# Patient Record
Sex: Male | Born: 1960 | Race: Black or African American | Hispanic: Refuse to answer | Marital: Single | Smoking: Current some day smoker
Health system: Southern US, Community
[De-identification: ages and names within clinical notes are randomized; demographics above are authoritative.]

## PROBLEM LIST (undated history)

## (undated) DIAGNOSIS — E669 Obesity, unspecified: Secondary | ICD-10-CM

## (undated) DIAGNOSIS — G4730 Sleep apnea, unspecified: Secondary | ICD-10-CM

## (undated) DIAGNOSIS — Z720 Tobacco use: Secondary | ICD-10-CM

## (undated) DIAGNOSIS — I10 Essential (primary) hypertension: Secondary | ICD-10-CM

## (undated) DIAGNOSIS — M25519 Pain in unspecified shoulder: Secondary | ICD-10-CM

## (undated) DIAGNOSIS — M961 Postlaminectomy syndrome, not elsewhere classified: Secondary | ICD-10-CM

## (undated) DIAGNOSIS — E785 Hyperlipidemia, unspecified: Secondary | ICD-10-CM

## (undated) HISTORY — DX: Pain in unspecified shoulder: M25.519

## (undated) HISTORY — PX: OTHER SURGICAL HISTORY: SHX169

## (undated) HISTORY — PX: GANGLION CYST EXCISION: SHX1691

## (undated) HISTORY — DX: Postlaminectomy syndrome, not elsewhere classified: M96.1

## (undated) HISTORY — DX: Tobacco use: Z72.0

## (undated) HISTORY — PX: TONSILLECTOMY: SUR1361

## (undated) HISTORY — DX: Hyperlipidemia, unspecified: E78.5

---

## 1995-02-05 HISTORY — PX: SPINAL CORD STIMULATOR IMPLANT: SHX2422

## 2007-02-05 HISTORY — PX: BACK SURGERY: SHX140

## 2009-12-26 ENCOUNTER — Emergency Department (HOSPITAL_COMMUNITY): Admission: EM | Admit: 2009-12-26 | Discharge: 2009-12-26 | Payer: Self-pay | Admitting: Emergency Medicine

## 2010-03-16 ENCOUNTER — Emergency Department (HOSPITAL_COMMUNITY): Payer: Medicare Other

## 2010-03-16 ENCOUNTER — Emergency Department (HOSPITAL_COMMUNITY)
Admission: EM | Admit: 2010-03-16 | Discharge: 2010-03-16 | Disposition: A | Discharge status: Home / Self Care | Payer: Medicare Other | Attending: Emergency Medicine | Admitting: Emergency Medicine

## 2010-03-16 DIAGNOSIS — J45909 Unspecified asthma, uncomplicated: Secondary | ICD-10-CM | POA: Insufficient documentation

## 2010-03-16 DIAGNOSIS — I10 Essential (primary) hypertension: Secondary | ICD-10-CM | POA: Insufficient documentation

## 2010-03-16 DIAGNOSIS — M545 Low back pain, unspecified: Secondary | ICD-10-CM | POA: Insufficient documentation

## 2010-03-16 DIAGNOSIS — E119 Type 2 diabetes mellitus without complications: Secondary | ICD-10-CM | POA: Insufficient documentation

## 2010-03-16 DIAGNOSIS — E669 Obesity, unspecified: Secondary | ICD-10-CM | POA: Insufficient documentation

## 2011-02-15 ENCOUNTER — Emergency Department (HOSPITAL_COMMUNITY)
Admission: EM | Admit: 2011-02-15 | Discharge: 2011-02-15 | Disposition: A | Discharge status: Home / Self Care | Payer: Medicare Other | Attending: Emergency Medicine | Admitting: Emergency Medicine

## 2011-02-15 ENCOUNTER — Encounter (HOSPITAL_COMMUNITY): Payer: Self-pay | Admitting: *Deleted

## 2011-02-15 DIAGNOSIS — H9209 Otalgia, unspecified ear: Secondary | ICD-10-CM | POA: Insufficient documentation

## 2011-02-15 DIAGNOSIS — E119 Type 2 diabetes mellitus without complications: Secondary | ICD-10-CM | POA: Insufficient documentation

## 2011-02-15 DIAGNOSIS — F068 Other specified mental disorders due to known physiological condition: Secondary | ICD-10-CM | POA: Insufficient documentation

## 2011-02-15 DIAGNOSIS — J45909 Unspecified asthma, uncomplicated: Secondary | ICD-10-CM | POA: Insufficient documentation

## 2011-02-15 DIAGNOSIS — Z79899 Other long term (current) drug therapy: Secondary | ICD-10-CM | POA: Insufficient documentation

## 2011-02-15 DIAGNOSIS — I10 Essential (primary) hypertension: Secondary | ICD-10-CM | POA: Insufficient documentation

## 2011-02-15 HISTORY — DX: Essential (primary) hypertension: I10

## 2011-02-15 MED ORDER — OXYCODONE-ACETAMINOPHEN 7.5-325 MG PO TABS: 1.0000 | ORAL_TABLET | ORAL | Status: AC | PRN

## 2011-02-15 MED ORDER — PENICILLIN V POTASSIUM 500 MG PO TABS: 500.0000 mg | ORAL_TABLET | Freq: Four times a day (QID) | ORAL | Status: AC

## 2011-02-15 NOTE — ED Provider Notes (Signed)
History     CSN: [id number]  Arrival date & time 02/15/11  4098   First MD Initiated Contact with Patient 02/15/11 [phone]      Chief Complaint  Patient presents with  . Otalgia    (Consider location/radiation/quality/duration/timing/severity/associated sxs/prior treatment) HPI The pt is c/o Pain in his lt ear for 2 weeks intermittently. He has seen his dentist that said the pain wa not due to hsi teeth  Past Medical History  Diagnosis Date  . Hypertension   . Diabetes mellitus   . Asthma     History reviewed. No pertinent past surgical history.  History reviewed. No pertinent family history.  History  Substance Use Topics  . Smoking status: Current Everyday Smoker  . Smokeless tobacco: Not on file  . Alcohol Use: Yes      Review of Systems  Unable to perform ROS: Dementia  All other systems reviewed and are negative.    Allergies  Review of patient's allergies indicates no known allergies.  Home Medications   Current Outpatient Rx  Name Route Sig Dispense Refill  . LISINOPRIL PO Oral Take 1 tablet by mouth daily.    Marland Kitchen PRAVASTATIN SODIUM PO Oral Take 1 tablet by mouth daily.    Marland Kitchen SITAGLIPTIN-METFORMIN HCL 50-500 MG PO TABS Oral Take 1 tablet by mouth 2 (two) times daily with a meal.    . OXYCODONE-ACETAMINOPHEN 7.5-325 MG PO TABS Oral Take 1 tablet by mouth every 4 (four) hours as needed for pain. 30 tablet 0  . PENICILLIN V POTASSIUM 500 MG PO TABS Oral Take 1 tablet (500 mg total) by mouth 4 (four) times daily. 40 tablet 0    BP 125/80  Pulse 90  Temp(Src) 98.3 F (36.8 C) (Oral)  Resp 18  SpO2 95%  Physical Exam  Nursing note and vitals reviewed. Constitutional: He appears well-developed and well-nourished. He is sleeping. No distress.  HENT:  Head: Normocephalic and atraumatic.  Right Ear: Tympanic membrane and external ear normal.  Left Ear: Tympanic membrane and external ear normal.  Mouth/Throat:    Eyes: Pupils are equal, round, and  reactive to light.  Neck: Normal range of motion.  Cardiovascular: Normal rate and intact distal pulses.   Pulmonary/Chest: Effort normal and breath sounds normal. No respiratory distress. He has no rales.  Abdominal: Normal appearance. He exhibits no distension.  Musculoskeletal: Normal range of motion.  Neurological: No cranial nerve deficit.  Skin: Skin is warm and dry. No rash noted.  Psychiatric: He has a normal mood and affect. His behavior is normal.    ED Course  Procedures (including critical care time)  Labs Reviewed - No data to display No results found.   1. Otalgia       MDM          Nelia Shi, MD 02/23/11 1816

## 2011-02-15 NOTE — ED Notes (Signed)
The pt is c/o  Pain in his lt ear for 2 weeks intermittently.  He has seen his dentist that said the pain wa not due to hsi teeth

## 2011-05-06 ENCOUNTER — Encounter (HOSPITAL_COMMUNITY): Payer: Self-pay | Admitting: Emergency Medicine

## 2011-05-06 ENCOUNTER — Emergency Department (HOSPITAL_COMMUNITY): Payer: Medicare Other

## 2011-05-06 ENCOUNTER — Emergency Department (HOSPITAL_COMMUNITY)
Admission: EM | Admit: 2011-05-06 | Discharge: 2011-05-06 | Disposition: A | Discharge status: Home / Self Care | Payer: Medicare Other | Attending: Emergency Medicine | Admitting: Emergency Medicine

## 2011-05-06 DIAGNOSIS — Y92009 Unspecified place in unspecified non-institutional (private) residence as the place of occurrence of the external cause: Secondary | ICD-10-CM | POA: Insufficient documentation

## 2011-05-06 DIAGNOSIS — S93402A Sprain of unspecified ligament of left ankle, initial encounter: Secondary | ICD-10-CM

## 2011-05-06 DIAGNOSIS — I10 Essential (primary) hypertension: Secondary | ICD-10-CM | POA: Insufficient documentation

## 2011-05-06 DIAGNOSIS — W108XXA Fall (on) (from) other stairs and steps, initial encounter: Secondary | ICD-10-CM | POA: Insufficient documentation

## 2011-05-06 DIAGNOSIS — M25579 Pain in unspecified ankle and joints of unspecified foot: Secondary | ICD-10-CM | POA: Insufficient documentation

## 2011-05-06 DIAGNOSIS — S93409A Sprain of unspecified ligament of unspecified ankle, initial encounter: Secondary | ICD-10-CM | POA: Insufficient documentation

## 2011-05-06 DIAGNOSIS — Z79899 Other long term (current) drug therapy: Secondary | ICD-10-CM | POA: Insufficient documentation

## 2011-05-06 DIAGNOSIS — E119 Type 2 diabetes mellitus without complications: Secondary | ICD-10-CM | POA: Insufficient documentation

## 2011-05-06 DIAGNOSIS — M25473 Effusion, unspecified ankle: Secondary | ICD-10-CM | POA: Insufficient documentation

## 2011-05-06 DIAGNOSIS — M25476 Effusion, unspecified foot: Secondary | ICD-10-CM | POA: Insufficient documentation

## 2011-05-06 DIAGNOSIS — J45909 Unspecified asthma, uncomplicated: Secondary | ICD-10-CM | POA: Insufficient documentation

## 2011-05-06 DIAGNOSIS — F17200 Nicotine dependence, unspecified, uncomplicated: Secondary | ICD-10-CM | POA: Insufficient documentation

## 2011-05-06 HISTORY — DX: Obesity, unspecified: E66.9

## 2011-05-06 MED ORDER — IBUPROFEN 400 MG PO TABS: 400.0000 mg | ORAL_TABLET | Freq: Three times a day (TID) | ORAL | Status: AC | PRN

## 2011-05-06 MED ORDER — HYDROCODONE-ACETAMINOPHEN 5-325 MG PO TABS: 2.0000 | ORAL_TABLET | ORAL | Status: AC | PRN

## 2011-05-06 NOTE — Progress Notes (Signed)
Orthopedic Tech Progress Note Patient Details:  Christian Bowen November 16, 1960 [id number]  Other Ortho Devices Type of Ortho Device: Crutches;Postop boot Ortho Device Location: (L) LE Ortho Device Interventions: Application;Ordered   Jennye Moccasin 05/06/2011, 11:10 PM

## 2011-05-06 NOTE — ED Notes (Signed)
PT. REPORTS LEFT ANKLE PAIN INJURED LAST Thursday WHILE GOING DOWN STEPS / FELL , AMBULATORY USING CANE.

## 2011-05-06 NOTE — ED Notes (Signed)
Ortho at bedside.

## 2011-05-06 NOTE — Discharge Instructions (Signed)
  Please review the instructions. Evaluated in the emergency department for the persistent pain and swelling in your left ankle since your fall last Thursday. The x-ray of your left ankle shows no broken bones. We have applied an Ace wrap, a wooden shoe and fitted you for crutches to assist in weightbearing. We have also prescribed a three-day regimen of ibuprofen and a short course of medication for pain. Please take as directed. If your symptoms are not improving over the next 5-7 days you'll need to call and arrange follow up with Dr. Lajoyce Corners for further evaluation of your left ankle pain.  Ankle Sprain An ankle sprain happens when the bands of tissue that hold the ankle bones together (ligaments) stretch too much and tear.  HOME CARE   Put ice on the ankle for 15 to 20 minutes, 3 to 4 times a day.   Put ice in a plastic bag.   Place a towel between your skin and the bag.   You may stop icing when the puffiness (swelling) goes down.   Raise (elevate) the injured ankle to lessen puffiness.   Use crutches if your doctor tells you to. Use them until you can walk without pain.   If a plaster splint was applied:   Rest the plaster splint on nothing harder than a pillow for 24 hours.   Do not put weight on it.   Do not get it wet.   Take it off to shower or bathe.   Follow up with your doctor.   Use an elastic wrap for support. Take the wrap off if the toes lose feeling (numb), tingle, or turn cold or blue.   If an air splint was applied:   Add or release air to make it comfortable.   Take it off at night and to shower and bathe.   Wiggle your toes while wearing the air splint.   Only take medicine as told by your doctor.   Do not drive until your doctor says it is okay.  GET HELP RIGHT AWAY IF:   You have more bruising, puffiness, or pain.   Your toes feel cold.   Your medicine does not help lessen your pain.   You are losing feeling in your toes or they turn blue.    You have severe pain.  MAKE SURE YOU:   Understand these instructions.   Will watch your condition.   Will get help right away if you are not doing well or get worse.  Document Released: 07/10/2007 Document Revised: 01/10/2011 Document Reviewed: 07/10/2007 [facility] Patient Information 2012 [place], [place].

## 2011-05-06 NOTE — ED Notes (Signed)
Ortho made aware  

## 2011-05-06 NOTE — ED Provider Notes (Signed)
History     CSN: [id number]  Arrival date & time 05/06/11  1939   First MD Initiated Contact with Patient 05/06/11 2236      Chief Complaint  Patient presents with  . Ankle Pain    (Consider location/radiation/quality/duration/timing/severity/associated sxs/prior treatment) Patient is a 51 y.o. male presenting with ankle pain. The history is provided by the patient.  Ankle Pain  The incident occurred more than 2 days ago. The incident occurred at home. The injury mechanism was a fall. The pain is present in the left ankle. The pain is at a severity of 10/10. The pain is severe. The pain has been constant since onset. Pertinent negatives include no numbness, no inability to bear weight and no tingling. The symptoms are aggravated by activity. He has tried rest, NSAIDs, elevation and immobilization for the symptoms.  Patient reports that last Thursday he fell going down some steps and twisted his left ankle. States he has had persistent swelling and pain since that time but has not seemed to improve. Patient is concerned he may have broken something in his ankle or damage to the ligament.  Past Medical History  Diagnosis Date  . Hypertension   . Diabetes mellitus   . Asthma   . Obesity     History reviewed. No pertinent past surgical history.  No family history on file.  History  Substance Use Topics  . Smoking status: Current Everyday Smoker  . Smokeless tobacco: Not on file  . Alcohol Use: Yes      Review of Systems  Constitutional: Negative.   HENT: Negative.   Eyes: Negative.   Respiratory: Negative.   Cardiovascular: Negative.   Gastrointestinal: Negative.   Genitourinary: Negative.   Musculoskeletal: Negative.   Skin: Negative.   Neurological: Negative.  Negative for tingling and numbness.  Hematological: Negative.   Psychiatric/Behavioral: Negative.     Allergies  Review of patient's allergies indicates no known allergies.  Home Medications   Current  Outpatient Rx  Name Route Sig Dispense Refill  . IBUPROFEN 800 MG PO TABS Oral Take 800 mg by mouth once.    Marland Kitchen LISINOPRIL 10 MG PO TABS Oral Take 10 mg by mouth every morning.    Marland Kitchen PRAVASTATIN SODIUM 20 MG PO TABS Oral Take 20 mg by mouth 2 (two) times daily.    Marland Kitchen SITAGLIPTIN-METFORMIN HCL 50-500 MG PO TABS Oral Take 1 tablet by mouth 2 (two) times daily with a meal.      BP 162/92  Pulse 97  Temp(Src) 99.2 F (37.3 C) (Oral)  Resp 20  SpO2 96%  Physical Exam  Constitutional: He is oriented to person, place, and time. He appears well-developed and well-nourished.  HENT:  Head: Normocephalic and atraumatic.  Eyes: Conjunctivae are normal.  Neck: Neck supple.  Cardiovascular: Normal rate and regular rhythm.   Pulmonary/Chest: Effort normal.  Musculoskeletal: Normal range of motion.       Feet:       Moderate amount of swelling noted to both the medial and lateral aspects of the left ankle. No obvious deformity. TTP and pain with passive range of motion.  Neurological: He is alert and oriented to person, place, and time.  Skin: Skin is warm and dry.  Psychiatric: He has a normal mood and affect.    ED Course  Procedures   Left ankle x-ray without acute findings. Ace wrap and postop boot applied. Patient fitted for crutches. Findings and clinical impression discussed with patient. Will plan for  discharge home with short course of medication for pain, and encourage patient to use crutches to assist with weightbearing and provide ortho referral for the patient to arrange followup for further evaluation if symptoms do not improve over the next 5-7 days. Patient agreeable with plan.  Labs Reviewed - No data to display Dg Ankle Complete Left  05/06/2011  *RADIOLOGY REPORT*  Clinical Data: Twisted left ankle.  LEFT ANKLE COMPLETE - 3+ VIEW  Comparison: None  Findings: The ankle mortise is maintained.  Mild degenerative changes are noted.  Rounded well-corticated bony density adjacent to  the distal fibula could be an old avulsion injury or infused secondary ossification center.  No acute fracture.  The visualized mid and hind foot bony structures are intact.  IMPRESSION: No acute fracture. Degenerative changes.  Original Report Authenticated By: P. Loralie Champagne, M.D.     No diagnosis found.    MDM  HPI/PE and clinical findings c/w 1. Ankle sprain (l) ankle  (left ankle x-ray without acute findings, moderate amount of swelling to medial and lateral aspects of left ankle, place and postop boot applied and patient fitted for crutches to assist with weightbearing. He was encouraged to follow up with orthosis symptoms do not improve over the next 5-7 days)        Leanne Chang, NP 05/06/11 [phone]

## 2011-05-06 NOTE — ED Notes (Signed)
Pt complaining of left ankle pain. He stated that he tripped down the stairs 1.5 weeks ago. Pain has become increasingly worse. Swelling in left ankle. Pt is able to move ankle up and down, but does complain of tenderness to touch. Pedal pulse palpable in left foot.  Pt able to move toes. Capillary refill brisk. Slight deformity to outer ankle. Will continue to monitor.

## 2011-05-08 NOTE — ED Provider Notes (Signed)
Medical screening examination/treatment/procedure(s) were performed by non-physician practitioner and as supervising physician I was immediately available for consultation/collaboration.   Shelda Jakes, MD 05/08/11 [phone]

## 2011-09-05 HISTORY — PX: NASAL TURBINATE REDUCTION: SHX2072

## 2011-09-13 ENCOUNTER — Encounter (HOSPITAL_COMMUNITY): Payer: Self-pay | Admitting: Pharmacy Technician

## 2011-09-20 ENCOUNTER — Encounter (HOSPITAL_COMMUNITY): Payer: Self-pay | Admitting: *Deleted

## 2011-09-20 NOTE — H&P (Signed)
PREOPERATIVE H&P  Chief Complaint: OSA  HPI: Christian Bowen is a 51 y.o. male who presents for evaluation of OSA and large tonsils. [place] presently uses nasal CPAP but doesn't tolerate this well. He had a sleep test several years ago with RDI of 42. He does experience nasal obstruction left side worse than right. He is taken to the OR for UPPP, septoplasty and TR.  Past Medical History  Diagnosis Date  . Hypertension   . Diabetes mellitus   . Asthma   . Obesity    No past surgical history on file. History   Social History  . Marital Status: Divorced    Spouse Name: N/A    Number of Children: N/A  . Years of Education: N/A   Social History Main Topics  . Smoking status: Current Everyday Smoker  . Smokeless tobacco: Not on file  . Alcohol Use: Yes  . Drug Use: No  . Sexually Active:    Other Topics Concern  . Not on file   Social History Narrative  . No narrative on file   No family history on file. No Known Allergies Prior to Admission medications   Medication Sig Start Date End Date Taking? Authorizing Provider  acetaminophen-codeine (TYLENOL #3) 300-30 MG per tablet Take 1 tablet by mouth every 4 (four) hours as needed. For pain   Yes Historical Provider, MD  lisinopril (PRINIVIL,ZESTRIL) 10 MG tablet Take 10 mg by mouth every morning.   Yes Historical Provider, MD  pravastatin (PRAVACHOL) 20 MG tablet Take 20 mg by mouth daily.    Yes Historical Provider, MD  sitaGLIPtan-metformin (JANUMET) 50-500 MG per tablet Take 1 tablet by mouth 2 (two) times daily with a meal.   Yes Historical Provider, MD     Positive ROS: nasal obstruction  All other systems have been reviewed and were otherwise negative with the exception of those mentioned in the HPI and as above.  Physical Exam: There were no vitals filed for this visit.  General: Alert, no acute distress Oral: Normal oral mucosa and 3+ tonsils Nasal: Clear nasal passages. Septal deviation to L with large  turbinates. Neck: No palpable adenopathy or thyroid nodules Ear: Ear canal is clear with normal appearing TMs Cardiovascular: Regular rate and rhythm, no murmur.  Respiratory: Clear to auscultation Neurologic: Alert and oriented x 3   Assessment/Plan: obstructive sleep apnea nasal obstructive septal deviation and turbinate hypertophy Plan for Procedure(s): UVULOPALATOPHARYNGOPLASTY (UPPP)/TONSILLECTOMY/SEPTOPLASTY TURBINATE RESECTION   Dillard Cannon, MD 09/20/2011 4:40 PM  O

## 2011-09-23 ENCOUNTER — Encounter (HOSPITAL_COMMUNITY): Payer: Self-pay | Admitting: Certified Registered Nurse Anesthetist

## 2011-09-23 ENCOUNTER — Encounter (HOSPITAL_COMMUNITY): Payer: Self-pay | Admitting: *Deleted

## 2011-09-23 ENCOUNTER — Observation Stay (HOSPITAL_COMMUNITY)
Admission: RE | Admit: 2011-09-23 | Discharge: 2011-09-24 | Disposition: A | Discharge status: Home / Self Care | Payer: Medicare Other | Source: Ambulatory Visit | Attending: Otolaryngology | Admitting: Otolaryngology

## 2011-09-23 ENCOUNTER — Ambulatory Visit (HOSPITAL_COMMUNITY): Payer: Medicare Other | Admitting: Certified Registered Nurse Anesthetist

## 2011-09-23 ENCOUNTER — Encounter (HOSPITAL_COMMUNITY)
Admission: RE | Disposition: A | Discharge status: Home / Self Care | Payer: Self-pay | Source: Ambulatory Visit | Attending: Otolaryngology

## 2011-09-23 ENCOUNTER — Ambulatory Visit (HOSPITAL_COMMUNITY): Payer: Medicare Other

## 2011-09-23 DIAGNOSIS — G4733 Obstructive sleep apnea (adult) (pediatric): Principal | ICD-10-CM | POA: Insufficient documentation

## 2011-09-23 DIAGNOSIS — E669 Obesity, unspecified: Secondary | ICD-10-CM | POA: Insufficient documentation

## 2011-09-23 DIAGNOSIS — I10 Essential (primary) hypertension: Secondary | ICD-10-CM | POA: Insufficient documentation

## 2011-09-23 DIAGNOSIS — E119 Type 2 diabetes mellitus without complications: Secondary | ICD-10-CM | POA: Insufficient documentation

## 2011-09-23 DIAGNOSIS — J342 Deviated nasal septum: Secondary | ICD-10-CM | POA: Insufficient documentation

## 2011-09-23 DIAGNOSIS — J45909 Unspecified asthma, uncomplicated: Secondary | ICD-10-CM | POA: Insufficient documentation

## 2011-09-23 DIAGNOSIS — J351 Hypertrophy of tonsils: Secondary | ICD-10-CM | POA: Insufficient documentation

## 2011-09-23 HISTORY — DX: Sleep apnea, unspecified: G47.30

## 2011-09-23 LAB — SURGICAL PCR SCREEN: MRSA, PCR: NEGATIVE

## 2011-09-23 LAB — CBC
HCT: 41.1 % (ref 39.0–52.0)
Hemoglobin: 13.7 g/dL (ref 13.0–17.0)
MCH: 30.1 pg (ref 26.0–34.0)
MCHC: 33.3 g/dL (ref 30.0–36.0)
RDW: 12.8 % (ref 11.5–15.5)

## 2011-09-23 LAB — BASIC METABOLIC PANEL
BUN: 21 mg/dL (ref 6–23)
Calcium: 9.9 mg/dL (ref 8.4–10.5)
GFR calc Af Amer: >90 mL/min (ref >90)
GFR calc non Af Amer: >90 mL/min (ref >90)
Glucose, Bld: 315 mg/dL — ABNORMAL HIGH (ref 70–99)
Sodium: 136 mEq/L (ref 135–145)

## 2011-09-23 LAB — GLUCOSE, CAPILLARY
Glucose-Capillary: 357 mg/dL — ABNORMAL HIGH (ref 70–99)
Glucose-Capillary: 328 mg/dL — ABNORMAL HIGH (ref 70–99)

## 2011-09-23 SURGERY — SEPTOPLASTY, NOSE, WITH TONSILLECTOMY AND UPPP
Anesthesia: General | Site: Nose | Wound class: Clean Contaminated

## 2011-09-23 MED ORDER — MUPIROCIN 2 % EX OINT
TOPICAL_OINTMENT | Freq: Two times a day (BID) | CUTANEOUS | Status: DC
  Administered 2011-09-23: 21:00:00 via NASAL
  Administered 2011-09-23: 1 via NASAL
  Filled 2011-09-23 (×2): qty 22

## 2011-09-23 MED ORDER — ROCURONIUM BROMIDE 100 MG/10ML IV SOLN
INTRAVENOUS | Status: DC | PRN
  Administered 2011-09-23: 10 mg via INTRAVENOUS
  Administered 2011-09-23: 20 mg via INTRAVENOUS
  Administered 2011-09-23: 30 mg via INTRAVENOUS
  Administered 2011-09-23: 10 mg via INTRAVENOUS

## 2011-09-23 MED ORDER — BACITRACIN ZINC 500 UNIT/GM EX OINT
TOPICAL_OINTMENT | CUTANEOUS | Status: DC | PRN
  Administered 2011-09-23: 1 via TOPICAL

## 2011-09-23 MED ORDER — LIDOCAINE HCL 4 % MT SOLN
OROMUCOSAL | Status: DC | PRN
  Administered 2011-09-23: 4 mL via TOPICAL

## 2011-09-23 MED ORDER — HYDROMORPHONE HCL PF 1 MG/ML IJ SOLN
0.2500 mg | INTRAMUSCULAR | Status: DC | PRN
  Administered 2011-09-23 (×3): 0.5 mg via INTRAVENOUS

## 2011-09-23 MED ORDER — DEXAMETHASONE SODIUM PHOSPHATE 4 MG/ML IJ SOLN
INTRAMUSCULAR | Status: DC | PRN
  Administered 2011-09-23: 10 mg via INTRAVENOUS

## 2011-09-23 MED ORDER — INSULIN ASPART 100 UNIT/ML ~~LOC~~ SOLN: 0.0000 [IU] | Freq: Every day | SUBCUTANEOUS | Status: DC

## 2011-09-23 MED ORDER — INSULIN ASPART 100 UNIT/ML ~~LOC~~ SOLN
SUBCUTANEOUS | Status: AC
  Filled 2011-09-23: qty 6

## 2011-09-23 MED ORDER — HYDROCODONE-ACETAMINOPHEN 7.5-500 MG/15ML PO SOLN
10.0000 mL | ORAL | Status: DC | PRN
  Administered 2011-09-23 – 2011-09-24 (×3): 15 mL via ORAL
  Filled 2011-09-23 (×3): qty 15

## 2011-09-23 MED ORDER — BACITRACIN ZINC 500 UNIT/GM EX OINT
TOPICAL_OINTMENT | CUTANEOUS | Status: AC
  Filled 2011-09-23: qty 15

## 2011-09-23 MED ORDER — DOUBLE ANTIBIOTIC 500-10000 UNIT/GM EX OINT
TOPICAL_OINTMENT | CUTANEOUS | Status: AC
  Filled 2011-09-23: qty 1

## 2011-09-23 MED ORDER — INSULIN ASPART 100 UNIT/ML ~~LOC~~ SOLN
6.0000 [IU] | Freq: Once | SUBCUTANEOUS | Status: DC
  Filled 2011-09-23: qty 3

## 2011-09-23 MED ORDER — INSULIN ASPART 100 UNIT/ML ~~LOC~~ SOLN
0.0000 [IU] | Freq: Three times a day (TID) | SUBCUTANEOUS | Status: DC
  Administered 2011-09-23 (×2): 7 [IU] via SUBCUTANEOUS

## 2011-09-23 MED ORDER — SODIUM CHLORIDE 0.9 % IV SOLN
INTRAVENOUS | Status: DC | PRN
  Administered 2011-09-23: 1000 mL

## 2011-09-23 MED ORDER — LIDOCAINE-EPINEPHRINE 1 %-1:100000 IJ SOLN
INTRAMUSCULAR | Status: DC | PRN
  Administered 2011-09-23: 20 mL

## 2011-09-23 MED ORDER — CEFAZOLIN SODIUM-DEXTROSE 2-3 GM-% IV SOLR
INTRAVENOUS | Status: AC
  Filled 2011-09-23: qty 50

## 2011-09-23 MED ORDER — KCL IN DEXTROSE-NACL 20-5-0.45 MEQ/L-%-% IV SOLN
INTRAVENOUS | Status: DC
  Filled 2011-09-23 (×2): qty 1000

## 2011-09-23 MED ORDER — CEFAZOLIN SODIUM-DEXTROSE 2-3 GM-% IV SOLR
INTRAVENOUS | Status: DC | PRN
  Administered 2011-09-23: 2 g via INTRAVENOUS

## 2011-09-23 MED ORDER — LINAGLIPTIN 5 MG PO TABS
5.0000 mg | ORAL_TABLET | Freq: Every day | ORAL | Status: DC
  Administered 2011-09-23: 5 mg via ORAL
  Filled 2011-09-23 (×2): qty 1

## 2011-09-23 MED ORDER — LISINOPRIL 10 MG PO TABS
10.0000 mg | ORAL_TABLET | Freq: Every day | ORAL | Status: DC
  Administered 2011-09-23: 10 mg via ORAL
  Filled 2011-09-23 (×2): qty 1

## 2011-09-23 MED ORDER — ACETAMINOPHEN 10 MG/ML IV SOLN
INTRAVENOUS | Status: DC | PRN
  Administered 2011-09-23: 1000 mg via INTRAVENOUS

## 2011-09-23 MED ORDER — ACETAMINOPHEN 10 MG/ML IV SOLN
INTRAVENOUS | Status: AC
  Filled 2011-09-23: qty 100

## 2011-09-23 MED ORDER — IBUPROFEN 100 MG/5ML PO SUSP
400.0000 mg | Freq: Four times a day (QID) | ORAL | Status: DC | PRN
  Filled 2011-09-23: qty 20

## 2011-09-23 MED ORDER — ONDANSETRON HCL 4 MG/2ML IJ SOLN: 4.0000 mg | INTRAMUSCULAR | Status: DC | PRN

## 2011-09-23 MED ORDER — CEFAZOLIN SODIUM 1-5 GM-% IV SOLN
1.0000 g | Freq: Three times a day (TID) | INTRAVENOUS | Status: DC
  Administered 2011-09-23 – 2011-09-24 (×3): 1 g via INTRAVENOUS
  Filled 2011-09-23 (×4): qty 50

## 2011-09-23 MED ORDER — ALBUTEROL SULFATE HFA 108 (90 BASE) MCG/ACT IN AERS
2.0000 | INHALATION_SPRAY | Freq: Four times a day (QID) | RESPIRATORY_TRACT | Status: DC | PRN
  Filled 2011-09-23: qty 6.7

## 2011-09-23 MED ORDER — OXYMETAZOLINE HCL 0.05 % NA SOLN
NASAL | Status: DC | PRN
  Administered 2011-09-23: 1 via NASAL

## 2011-09-23 MED ORDER — PROPOFOL 10 MG/ML IV EMUL
INTRAVENOUS | Status: DC | PRN
  Administered 2011-09-23: 200 mg via INTRAVENOUS

## 2011-09-23 MED ORDER — INSULIN ASPART 100 UNIT/ML ~~LOC~~ SOLN
0.0000 [IU] | Freq: Three times a day (TID) | SUBCUTANEOUS | Status: DC
Start: 2011-09-23 — End: 2011-09-23

## 2011-09-23 MED ORDER — ONDANSETRON HCL 4 MG PO TABS: 4.0000 mg | ORAL_TABLET | ORAL | Status: DC | PRN

## 2011-09-23 MED ORDER — OXYMETAZOLINE HCL 0.05 % NA SOLN
NASAL | Status: AC
  Filled 2011-09-23: qty 15

## 2011-09-23 MED ORDER — GLYCOPYRROLATE 0.2 MG/ML IJ SOLN
INTRAMUSCULAR | Status: DC | PRN
  Administered 2011-09-23: .7 mg via INTRAVENOUS

## 2011-09-23 MED ORDER — LIDOCAINE HCL (CARDIAC) 20 MG/ML IV SOLN
INTRAVENOUS | Status: DC | PRN
  Administered 2011-09-23: 100 mg via INTRAVENOUS

## 2011-09-23 MED ORDER — SODIUM CHLORIDE 0.45 % IV SOLN
INTRAVENOUS | Status: DC
  Administered 2011-09-23: 13:00:00 via INTRAVENOUS

## 2011-09-23 MED ORDER — INSULIN REGULAR HUMAN 100 UNIT/ML IJ SOLN: 6.0000 [IU] | Freq: Once | INTRAMUSCULAR | Status: DC

## 2011-09-23 MED ORDER — MORPHINE SULFATE 2 MG/ML IJ SOLN
2.0000 mg | INTRAMUSCULAR | Status: DC | PRN
  Administered 2011-09-23 – 2011-09-24 (×3): 4 mg via INTRAVENOUS
  Filled 2011-09-23 (×3): qty 2

## 2011-09-23 MED ORDER — SUCCINYLCHOLINE CHLORIDE 20 MG/ML IJ SOLN
INTRAMUSCULAR | Status: DC | PRN
  Administered 2011-09-23: 120 mg via INTRAVENOUS

## 2011-09-23 MED ORDER — PHENOL 1.4 % MT LIQD
1.0000 | OROMUCOSAL | Status: DC | PRN
  Filled 2011-09-23: qty 177

## 2011-09-23 MED ORDER — MEPERIDINE HCL 25 MG/ML IJ SOLN: 6.2500 mg | INTRAMUSCULAR | Status: DC | PRN

## 2011-09-23 MED ORDER — NEOSTIGMINE METHYLSULFATE 1 MG/ML IJ SOLN
INTRAMUSCULAR | Status: DC | PRN
  Administered 2011-09-23: 5 mg via INTRAVENOUS

## 2011-09-23 MED ORDER — HYDROMORPHONE HCL PF 1 MG/ML IJ SOLN
INTRAMUSCULAR | Status: AC
  Filled 2011-09-23: qty 1

## 2011-09-23 MED ORDER — SODIUM CHLORIDE 0.9 % IV SOLN
INTRAVENOUS | Status: DC | PRN
  Administered 2011-09-23 (×2): via INTRAVENOUS

## 2011-09-23 MED ORDER — FENTANYL CITRATE 0.05 MG/ML IJ SOLN
INTRAMUSCULAR | Status: DC | PRN
  Administered 2011-09-23: 50 ug via INTRAVENOUS
  Administered 2011-09-23: 150 ug via INTRAVENOUS
  Administered 2011-09-23: 50 ug via INTRAVENOUS

## 2011-09-23 MED ORDER — HYDROMORPHONE HCL PF 1 MG/ML IJ SOLN
INTRAMUSCULAR | Status: AC
  Administered 2011-09-23: 0.5 mg via INTRAVENOUS
  Filled 2011-09-23: qty 1

## 2011-09-23 MED ORDER — PROMETHAZINE HCL 25 MG/ML IJ SOLN: 6.2500 mg | INTRAMUSCULAR | Status: DC | PRN

## 2011-09-23 MED ORDER — 0.9 % SODIUM CHLORIDE (POUR BTL) OPTIME
TOPICAL | Status: DC | PRN
  Administered 2011-09-23: 1000 mL

## 2011-09-23 MED ORDER — METFORMIN HCL 500 MG PO TABS
500.0000 mg | ORAL_TABLET | Freq: Two times a day (BID) | ORAL | Status: DC
  Filled 2011-09-23 (×3): qty 1

## 2011-09-23 MED ORDER — MIDAZOLAM HCL 5 MG/5ML IJ SOLN
INTRAMUSCULAR | Status: DC | PRN
  Administered 2011-09-23: 2 mg via INTRAVENOUS

## 2011-09-23 MED ORDER — ACETAMINOPHEN-CODEINE #3 300-30 MG PO TABS: 1.0000 | ORAL_TABLET | ORAL | Status: DC | PRN

## 2011-09-23 MED ORDER — INSULIN REGULAR HUMAN 100 UNIT/ML IJ SOLN
6.0000 [IU] | Freq: Once | INTRAMUSCULAR | Status: DC
Start: 2011-09-23 — End: 2011-09-23
  Administered 2011-09-23: 6 [IU] via INTRAVENOUS

## 2011-09-23 MED ORDER — LIDOCAINE-EPINEPHRINE 1 %-1:100000 IJ SOLN
INTRAMUSCULAR | Status: AC
  Filled 2011-09-23: qty 2

## 2011-09-23 SURGICAL SUPPLY — 45 items
BLADE INF TURB ROT M4 2 5PK (BLADE) ×2 IMPLANT
CANISTER SUCTION 2500CC (MISCELLANEOUS) ×2 IMPLANT
CLEANER TIP ELECTROSURG 2X2 (MISCELLANEOUS) ×2 IMPLANT
CLOTH BEACON ORANGE TIMEOUT ST (SAFETY) ×2 IMPLANT
COAGULATOR SUCT 8FR VV (MISCELLANEOUS) IMPLANT
COAGULATOR SUCT SWTCH 10FR 6 (ELECTROSURGICAL) ×2 IMPLANT
DRESSING NASAL KENNEDY 3.5X.9 (MISCELLANEOUS) IMPLANT
DRESSING TELFA 8X3 (GAUZE/BANDAGES/DRESSINGS) ×2 IMPLANT
DRSG NASAL KENNEDY 3.5X.9 (MISCELLANEOUS)
ELECT COATED BLADE 2.86 ST (ELECTRODE) ×2 IMPLANT
ELECT REM PT RETURN 9FT ADLT (ELECTROSURGICAL) ×2
ELECTRODE REM PT RTRN 9FT ADLT (ELECTROSURGICAL) ×1 IMPLANT
GLOVE SS BIOGEL STRL SZ 7.5 (GLOVE) ×1 IMPLANT
GLOVE SUPERSENSE BIOGEL SZ 7.5 (GLOVE) ×1
GOWN PREVENTION PLUS XLARGE (GOWN DISPOSABLE) ×2 IMPLANT
GOWN STRL NON-REIN LRG LVL3 (GOWN DISPOSABLE) ×2 IMPLANT
KIT BASIN OR (CUSTOM PROCEDURE TRAY) ×2 IMPLANT
KIT ROOM TURNOVER OR (KITS) ×2 IMPLANT
NEEDLE HYPO 25X1 1.5 SAFETY (NEEDLE) ×2 IMPLANT
NS IRRIG 1000ML POUR BTL (IV SOLUTION) ×2 IMPLANT
PAD ARMBOARD 7.5X6 YLW CONV (MISCELLANEOUS) ×4 IMPLANT
PATTIES SURGICAL .5 X3 (DISPOSABLE) ×2 IMPLANT
PENCIL FOOT CONTROL (ELECTRODE) ×2 IMPLANT
SPECIMEN JAR SMALL (MISCELLANEOUS) ×2 IMPLANT
SPLINT NASAL DOYLE BI-VL (GAUZE/BANDAGES/DRESSINGS) IMPLANT
SPONGE INTESTINAL PEANUT (DISPOSABLE) ×2 IMPLANT
SPONGE TONSIL 1 RF SGL (DISPOSABLE) IMPLANT
STRIP CLOSURE SKIN 1/2X4 (GAUZE/BANDAGES/DRESSINGS) IMPLANT
SUT CHROMIC 3 0 PS 2 (SUTURE) ×2 IMPLANT
SUT CHROMIC 4 0 P 3 18 (SUTURE) ×8 IMPLANT
SUT CHROMIC 4 0 PS 5 (SUTURE) IMPLANT
SUT CHROMIC 4 0 SH 27 (SUTURE) ×2 IMPLANT
SUT CHROMIC 5 0 P 3 (SUTURE) IMPLANT
SUT ETHILON 3 0 PS 1 (SUTURE) IMPLANT
SUT ETHILON 4 0 PS 2 18 (SUTURE) IMPLANT
SUT ETHILON 5 0 P 3 18 (SUTURE)
SUT NYLON ETHILON 5-0 P-3 1X18 (SUTURE) IMPLANT
SUT SILK 2 0 FS (SUTURE) ×2 IMPLANT
SUT VIC AB 3-0 SH 27 (SUTURE) ×1
SUT VIC AB 3-0 SH 27XBRD (SUTURE) ×1 IMPLANT
SYR 3ML 25GX5/8 SAFETY (SYRINGE) IMPLANT
TOWEL OR 17X24 6PK STRL BLUE (TOWEL DISPOSABLE) ×2 IMPLANT
TOWEL OR 17X26 10 PK STRL BLUE (TOWEL DISPOSABLE) ×2 IMPLANT
TRAY ENT MC OR (CUSTOM PROCEDURE TRAY) ×2 IMPLANT
WATER STERILE IRR 1000ML POUR (IV SOLUTION) ×2 IMPLANT

## 2011-09-23 NOTE — Interval H&P Note (Signed)
History and Physical Interval Note:  09/23/2011 7:46 AM  Christian Bowen  has presented today for surgery, with the diagnosis of obstructive sleep apnea nasal obstructive septal deviation   The various methods of treatment have been discussed with the patient and family. After consideration of risks, benefits and other options for treatment, the patient has consented to  Procedure(s) (LRB): UVULOPALATOPHARYNGOPLASTY (UPPP)/TONSILLECTOMY/SEPTOPLASTY (N/A) TURBINATE RESECTION (N/A) as a surgical intervention .  [name] history has been reviewed, patient examined, no change in status, stable for surgery.  I have reviewed the patient's chart and labs.  Questions were answered to the patient's satisfaction.     [name], [name]

## 2011-09-23 NOTE — Interval H&P Note (Signed)
History and Physical Interval Note:  09/23/2011 7:45 AM  [name]  has presented today for surgery, with the diagnosis of obstructive sleep apnea nasal obstructive septal deviation   The various methods of treatment have been discussed with the patient and family. After consideration of risks, benefits and other options for treatment, the patient has consented to  Procedure(s) (LRB): UVULOPALATOPHARYNGOPLASTY (UPPP)/TONSILLECTOMY/SEPTOPLASTY (N/A) TURBINATE RESECTION (N/A) as a surgical intervention .  [name] history has been reviewed, patient examined, no change in status, stable for surgery.  I have reviewed the patient's chart and labs.  Questions were answered to the patient's satisfaction.     Cortnie Ringel

## 2011-09-23 NOTE — Anesthesia Preprocedure Evaluation (Addendum)
Anesthesia Evaluation  Patient identified by MRN, date of birth, ID band Patient awake    Reviewed: Allergy & Precautions  History of Anesthesia Complications Negative for: history of anesthetic complications  Airway Mallampati: I TM Distance: >3 FB Neck ROM: Full    Dental  (+) Teeth Intact and Dental Advisory Given   Pulmonary asthma , sleep apnea and Continuous Positive Airway Pressure Ventilation ,  breath sounds clear to auscultation        Cardiovascular Exercise Tolerance: Good hypertension, Pt. on medications Rhythm:Regular Rate:Tachycardia     Neuro/Psych Spinal stimulator in L hip- placed in 1997. Chronic back pain.  negative psych ROS   GI/Hepatic   Endo/Other  Poorly Controlled, Type 2, Oral Hypoglycemic AgentsMorbid obesityDM seems poorly controlled  Renal/GU      Musculoskeletal negative musculoskeletal ROS (+)   Abdominal (+) + obese,   Peds  Hematology negative hematology ROS (+)   Anesthesia Other Findings   Reproductive/Obstetrics                         Anesthesia Physical Anesthesia Plan  ASA: III  Anesthesia Plan: General   Post-op Pain Management:    Induction: Intravenous  Airway Management Planned: Oral ETT  Additional Equipment:   Intra-op Plan:   Post-operative Plan: Extubation in OR  Informed Consent: I have reviewed the patients History and Physical, chart, labs and discussed the procedure including the risks, benefits and alternatives for the proposed anesthesia with the patient or authorized representative who has indicated his/her understanding and acceptance.   Dental advisory given  Plan Discussed with: CRNA and Surgeon  Anesthesia Plan Comments:         Anesthesia Quick Evaluation

## 2011-09-23 NOTE — Progress Notes (Signed)
Paged Dr. Dillard Cannon to get order for consent.

## 2011-09-23 NOTE — Progress Notes (Signed)
POST OP  VSS no airway problems or bleeding. Complains of packing in nose. Will remove in am. OK pos Plan discharge in am.

## 2011-09-23 NOTE — Transfer of Care (Signed)
Immediate Anesthesia Transfer of Care Note  Patient: Christian Bowen  Procedure(s) Performed: Procedure(s) (LRB): UVULOPALATOPHARYNGOPLASTY (UPPP)/TONSILLECTOMY/SEPTOPLASTY (N/A) TURBINATE RESECTION (N/A)  Patient Location: PACU  Anesthesia Type: General  Level of Consciousness: awake, alert , oriented and patient cooperative  Airway & Oxygen Therapy: Patient Spontanous Breathing and Patient connected to face mask oxygen  Post-op Assessment: Report given to PACU RN, Post -op Vital signs reviewed and stable and Patient moving all extremities  Post vital signs: Reviewed and stable  Complications: No apparent anesthesia complications

## 2011-09-23 NOTE — Brief Op Note (Signed)
09/23/2011  10:08 AM  PATIENT:  Christian Bowen  51 y.o. male  PRE-OPERATIVE DIAGNOSIS:  obstructive sleep apnea nasal obstructive septal deviation with nasal obstruction  POST-OPERATIVE DIAGNOSIS:  obstructive sleep apnea nasal obstructive septal deviation   PROCEDURE:  Procedure(s) (LRB): UVULOPALATOPHARYNGOPLASTY (UPPP)/TONSILLECTOMY/SEPTOPLASTY (N/A) TURBINATE REDUCTION (N/A)  SURGEON:  Surgeon(s) and Role:    * Drema Halon, MD - Primary  PHYSICIAN ASSISTANT:   ASSISTANTS: none   ANESTHESIA:   general  EBL:  Total I/O In: 1000 [I.V.:1000] Out: 150 [Blood:150]  BLOOD ADMINISTERED:none  DRAINS: none   LOCAL MEDICATIONS USED:  NONE  SPECIMEN:  No Specimen  DISPOSITION OF SPECIMEN:  N/A  COUNTS:  YES  TOURNIQUET:  * No tourniquets in log *  DICTATION: .Other Dictation: Dictation Number [id number]  PLAN OF CARE: Admit for overnight observation  PATIENT DISPOSITION:  PACU - hemodynamically stable.   Delay start of Pharmacological VTE agent (>24hrs) due to surgical blood loss or risk of bleeding: not applicable

## 2011-09-23 NOTE — Anesthesia Postprocedure Evaluation (Signed)
  Anesthesia Post-op Note  Patient: Christian Bowen  Procedure(s) Performed: Procedure(s) (LRB): UVULOPALATOPHARYNGOPLASTY (UPPP)/TONSILLECTOMY/SEPTOPLASTY (N/A) TURBINATE RESECTION (N/A)  Patient Location: PACU  Anesthesia Type: General  Level of Consciousness: awake and alert   Airway and Oxygen Therapy: Patient Spontanous Breathing  Post-op Pain: mild  Post-op Assessment: Post-op Vital signs reviewed, Patient's Cardiovascular Status Stable and Respiratory Function Stable  Post-op Vital Signs: stable  Complications: No apparent anesthesia complications

## 2011-09-23 NOTE — Progress Notes (Signed)
Utilization review complete 

## 2011-09-23 NOTE — Preoperative (Signed)
Beta Blockers   Reason not to administer Beta Blockers:Not Applicable 

## 2011-09-23 NOTE — Progress Notes (Signed)
Received patient from PACU.  CBG's and BP elevated (MD notified and new orders received); all other VVS.  Patient's o2 stats 93% on face tent.  Patient with c/o, will give pain meds.  Will continue to monitor.

## 2011-09-23 NOTE — OR Nursing (Signed)
Tonsil started at 0928.

## 2011-09-24 LAB — GLUCOSE, CAPILLARY

## 2011-09-24 MED ORDER — HYDROCODONE-ACETAMINOPHEN 7.5-500 MG/15ML PO SOLN: 15.0000 mL | ORAL | Status: AC | PRN

## 2011-09-24 MED ORDER — AMOXICILLIN 250 MG/5ML PO SUSR: 500.0000 mg | Freq: Two times a day (BID) | ORAL | Status: AC

## 2011-09-24 MED ORDER — LINAGLIPTIN 5 MG PO TABS: 5.0000 mg | ORAL_TABLET | Freq: Every day | ORAL | Status: DC

## 2011-09-24 MED ORDER — METFORMIN HCL 500 MG PO TABS: 500.0000 mg | ORAL_TABLET | Freq: Two times a day (BID) | ORAL | Status: DC

## 2011-09-24 MED ORDER — AMOXICILLIN 250 MG/5ML PO SUSR
500.0000 mg | Freq: Two times a day (BID) | ORAL | Status: DC
  Filled 2011-09-24 (×2): qty 10

## 2011-09-24 NOTE — Op Note (Signed)
NAMEELIYA, GEIMAN             ACCOUNT NO.:  [id number]  MEDICAL RECORD NO.:  [id number]  LOCATION:  3301                         FACILITY:  [facility]  PHYSICIAN:  Kristine Garbe. Ezzard Standing, M.D.DATE OF BIRTH:  12-Jul-1960  DATE OF PROCEDURE:  09/23/2011 DATE OF DISCHARGE:                              OPERATIVE REPORT   PREOPERATIVE DIAGNOSES: 1. Obstructive sleep apnea with tonsillar hypertrophy. 2. Nasal obstruction with septal deformity and turbinate hypertrophy     with septal deviation to the left.  POSTOPERATIVE DIAGNOSES: 1. Obstructive sleep apnea with tonsillar hypertrophy. 2. Nasal obstruction with septal deformity and turbinate hypertrophy     with septal deviation to the left.  OPERATIONS PERFORMED: 1. Septoplasty with bilateral inferior turbinate reductions (Medtronic     turbinate blade). 2. Uvulopalatopharyngoplasty with tonsillectomy.  SURGEON:  Kristine Garbe. Ezzard Standing, MD  ANESTHESIA:  General endotracheal.  COMPLICATIONS:  None.  BRIEF CLINICAL NOTE:  Christian Bowen is a 51 year old gentleman who is obese and significantly overweight.  He has had a long history of sleep apnea.  He has been using nasal CPAP, but does not really tolerate the nasal CPAP that well.  He had a previous sleep test done approximately 4 years ago, which showed an RDI of a little bit over 40.  He does complain of some nasal obstruction and on exam in the office, he has a moderate septal deflection to the left with a very narrowed left nasal passageway compared to the right.  He has significant turbinate hypertrophy bilaterally.  There are no polyps, no obstructing lesions noted within the nose.  Oral exam reveals generous size 2-3+ size tonsils bilaterally with elongated uvula, redundant palatal mucosa.  He was taken to the operating room at this time for septoplasty and turbinate reductions to improve his nasal airway and UPPP with tonsillectomy to improve the oral airway and to  improve the sleep apnea. He agrees with the risks involved with surgery and also informed him that following surgery, may still need to use his nasal CPAP until he loses significant amount of weight, but this should help improve his sleep apnea significantly.  DESCRIPTION OF PROCEDURE:  After adequate endotracheal anesthesia, the patient received 2 g of Ancef IV preoperatively along with 10 mg of Decadron.  First, the nose was prepped with Betadine solution, was draped out with sterile towels, and the nose was further prepped with cotton pledgets soaked in Afrin and septum area and turbinates were injected with 7 mL of Xylocaine with epinephrine for hemostasis.  On exam, the patient had a septal deviation to the left with a very large left inferior turbinate, and a likewise large right inferior turbinate. A hemitransfixion incision was made along the caudal edge of septum on the right side.  Mucoperichondrial and mucoperiosteal flaps were elevated on the left side of the septum.  Portion of the cartilaginous septum that bowed off the left maxillary crest into the left airway was removed.  An osteotome was used to remove a little bit of the maxillary crest bone that protruded to the left as well as fracture of the maxillary crest back more toward the midline.  Just anterior to the bony cartilaginous junction, a vertical incision  was made and some of the thickened bony and posterior cartilaginous septum was removed.  The anterior 2-2.5 cm of cartilage septum was preserved.  This completed the septoplasty portion of the procedure.  Next, inferior turbinate reductions were performed.  Using the Medtronic turbinate blade, submucosal reduction of the inferior turbinates was performed bilaterally.  After performing submucosal reduction of the inferior turbinates using the Medtronic turbinate blade, the turbinates were outfractured and cautery was used for hemostasis.  This completed  the turbinate reductions.  A hemitransfixion incision was closed with 4-0 chromic suture.  The septum was basted with a 4-0 chromic suture and then the nose was packed with Telfa soaked in bacitracin ointment bilaterally.  Next, the patient was turned to mouthgag to expose oropharynx and the tonsils were examined.  The patient had a large tonsils with redundant oral mucosa.  A portion of the anterior tonsillar pillar as well as the tonsils were removed bilaterally.  Incision was then carried along the edge of the soft palate, and the distal 0.5-cm soft palate was amputated along with the uvula.  Hemostasis was obtained with cautery.  The oropharynx was irrigated with saline.  4-0 Vicryl sutures and 4-0 chromic sutures were then used to reapproximate the edges of the soft palate, and closed the tonsillar fossas.  This completed the procedure.  There was minimal bleeding.  Oropharynx was irrigated with saline.  Koltin was subsequently awoken from anesthesia and will be admitted to step-down unit for 24-hour observation.  We will plan on removing his nasal packs in the morning and discharging him home on amoxicillin suspension 500 mg b.i.d. for 1 week, along with Tylenol Lortab elixir 3-4 teaspoons q.4 hours p.r.n. pain.  I will have him follow up in my office in 10-14 days for recheck.          ______________________________ Kristine Garbe. Ezzard Standing, M.D.     CEN/MEDQ  D:  09/23/2011  T:  09/24/2011  Job:  [id number]  cc:   Windle Guard, M.D.

## 2011-09-24 NOTE — Discharge Summary (Signed)
Discharge summary dictated   [id number]

## 2011-09-24 NOTE — Progress Notes (Signed)
POD 1 VSS.  No airway problems. Nasal packing removed with minimal bleeding. Discharge home on Amox and Lortab 15 cc q 4 Follow up my office 1 week.

## 2011-09-24 NOTE — Progress Notes (Signed)
Patient d/c'd home per MD order.  Patient and family given d/c instructions/prescriptions and all questions answered.  MD d/c'd nasal packing at bedside and patient tolerated well.  Patient's assessment WNL and VVS.  Patient d/c'd via wheelchair with volunteer.

## 2011-09-25 NOTE — Discharge Summary (Signed)
NAMEMICKELL, Christian Bowen             ACCOUNT NO.:  [id number]  MEDICAL RECORD NO.:  [id number]  LOCATION:  3301                         FACILITY:  [facility]  PHYSICIAN:  Kristine Garbe. Ezzard Standing, M.D.DATE OF BIRTH:  [birth year]  DATE OF ADMISSION:  09/23/2011 DATE OF DISCHARGE:  09/24/2011                              DISCHARGE SUMMARY   DIAGNOSES: 1. Obstructive sleep apnea with enlarged tonsils. 2. Nasal obstruction with septal deviation. 3. Hypertension. 4. Non-insulin-dependent diabetes. 5. Asthma. 6. Obesity.  OPERATIONS DONE DURING THIS HOSPITALIZATION:  Septoplasty with bilateral inferior turbinate reductions with a uvulopalatopharyngoplasty and tonsillectomy.  Date of operation September 23, 2011.  HOSPITAL COURSE:  The patient was admitted via the operating room after undergoing a septoplasty, turbinate reductions, UPPP with tonsillectomy. The patient tolerated the procedure well.  He was admitted for observation for 24-hour observation in the Step-Down Unit at 3300. He received postoperative Ancef 1 g IV q.8.  He had no airway problems during his observation.  He tolerated p.o. relatively well and had no substantial bleeding.  His nasal packing was removed on his first postoperative day and the patient was subsequently discharged home from 3300 without any significant problems.  DISPOSITION:  The patient is discharged home.  He will follow up in my office in 2 weeks for recheck.  He is discharged home on his regular medications in addition to amoxicillin suspension 500 mg b.i.d. for 1 week, Lortab Elixir 15 mL q.4 hours p.r.n. pain.          ______________________________ Kristine Garbe. Ezzard Standing, M.D.     CEN/MEDQ  D:  09/24/2011  T:  09/25/2011  Job:  [id number]

## 2011-11-11 ENCOUNTER — Other Ambulatory Visit: Payer: Self-pay | Admitting: Orthopedic Surgery

## 2011-11-11 DIAGNOSIS — M25512 Pain in left shoulder: Secondary | ICD-10-CM

## 2011-11-12 ENCOUNTER — Other Ambulatory Visit: Payer: Self-pay | Admitting: Orthopedic Surgery

## 2011-11-12 DIAGNOSIS — M25512 Pain in left shoulder: Secondary | ICD-10-CM

## 2011-11-13 ENCOUNTER — Ambulatory Visit
Admission: RE | Admit: 2011-11-13 | Discharge: 2011-11-13 | Disposition: A | Discharge status: Home / Self Care | Payer: Medicare Other | Source: Ambulatory Visit | Attending: Orthopedic Surgery | Admitting: Orthopedic Surgery

## 2011-11-13 DIAGNOSIS — M25512 Pain in left shoulder: Secondary | ICD-10-CM

## 2011-11-13 MED ORDER — IOHEXOL 180 MG/ML  SOLN
15.0000 mL | Freq: Once | INTRAMUSCULAR | Status: AC | PRN
  Administered 2011-11-13: 15 mL via INTRA_ARTICULAR

## 2012-08-12 ENCOUNTER — Other Ambulatory Visit: Payer: Self-pay | Admitting: Orthopedic Surgery

## 2012-08-12 ENCOUNTER — Encounter (HOSPITAL_BASED_OUTPATIENT_CLINIC_OR_DEPARTMENT_OTHER): Payer: Self-pay | Admitting: *Deleted

## 2012-08-12 NOTE — Progress Notes (Signed)
Pt had surgery to correct sleep apnea but does still use cpap-to bring and use post op-he said he had this wrist done [facility] 2[facility]-they did ekg-called for ekg and aneth record, will need istat

## 2012-08-13 ENCOUNTER — Ambulatory Visit (HOSPITAL_BASED_OUTPATIENT_CLINIC_OR_DEPARTMENT_OTHER): Payer: Medicare Other | Admitting: Anesthesiology

## 2012-08-13 ENCOUNTER — Encounter (HOSPITAL_BASED_OUTPATIENT_CLINIC_OR_DEPARTMENT_OTHER)
Admission: RE | Disposition: A | Discharge status: Home / Self Care | Payer: Self-pay | Source: Ambulatory Visit | Attending: Orthopedic Surgery

## 2012-08-13 ENCOUNTER — Encounter (HOSPITAL_BASED_OUTPATIENT_CLINIC_OR_DEPARTMENT_OTHER): Payer: Self-pay | Admitting: Anesthesiology

## 2012-08-13 ENCOUNTER — Encounter (HOSPITAL_BASED_OUTPATIENT_CLINIC_OR_DEPARTMENT_OTHER): Payer: Self-pay | Admitting: *Deleted

## 2012-08-13 ENCOUNTER — Ambulatory Visit (HOSPITAL_BASED_OUTPATIENT_CLINIC_OR_DEPARTMENT_OTHER)
Admission: RE | Admit: 2012-08-13 | Discharge: 2012-08-13 | Disposition: A | Discharge status: Home / Self Care | Payer: Medicare Other | Source: Ambulatory Visit | Attending: Orthopedic Surgery | Admitting: Orthopedic Surgery

## 2012-08-13 DIAGNOSIS — M654 Radial styloid tenosynovitis [de Quervain]: Secondary | ICD-10-CM | POA: Insufficient documentation

## 2012-08-13 DIAGNOSIS — G4730 Sleep apnea, unspecified: Secondary | ICD-10-CM | POA: Insufficient documentation

## 2012-08-13 DIAGNOSIS — Z79899 Other long term (current) drug therapy: Secondary | ICD-10-CM | POA: Insufficient documentation

## 2012-08-13 DIAGNOSIS — Z6841 Body Mass Index (BMI) 40.0 and over, adult: Secondary | ICD-10-CM | POA: Insufficient documentation

## 2012-08-13 DIAGNOSIS — G5630 Lesion of radial nerve, unspecified upper limb: Secondary | ICD-10-CM | POA: Insufficient documentation

## 2012-08-13 DIAGNOSIS — E119 Type 2 diabetes mellitus without complications: Secondary | ICD-10-CM | POA: Insufficient documentation

## 2012-08-13 DIAGNOSIS — M6740 Ganglion, unspecified site: Secondary | ICD-10-CM | POA: Insufficient documentation

## 2012-08-13 DIAGNOSIS — F17200 Nicotine dependence, unspecified, uncomplicated: Secondary | ICD-10-CM | POA: Insufficient documentation

## 2012-08-13 DIAGNOSIS — I10 Essential (primary) hypertension: Secondary | ICD-10-CM | POA: Insufficient documentation

## 2012-08-13 DIAGNOSIS — J45909 Unspecified asthma, uncomplicated: Secondary | ICD-10-CM | POA: Insufficient documentation

## 2012-08-13 DIAGNOSIS — E669 Obesity, unspecified: Secondary | ICD-10-CM | POA: Insufficient documentation

## 2012-08-13 HISTORY — PX: GANGLION CYST EXCISION: SHX1691

## 2012-08-13 HISTORY — PX: DORSAL COMPARTMENT RELEASE: SHX5039

## 2012-08-13 LAB — POCT I-STAT, CHEM 8
BUN: 18 mg/dL (ref 6–23)
Chloride: 102 mEq/L (ref 96–112)
Creatinine, Ser: 1 mg/dL (ref 0.50–1.35)
Glucose, Bld: 140 mg/dL — ABNORMAL HIGH (ref 70–99)
Hemoglobin: 15.6 g/dL (ref 13.0–17.0)
Potassium: 4.1 mEq/L (ref 3.5–5.1)

## 2012-08-13 SURGERY — RELEASE, FIRST DORSAL COMPARTMENT, HAND
Anesthesia: Choice | Site: Wrist | Laterality: Left | Wound class: Clean

## 2012-08-13 MED ORDER — OXYCODONE-ACETAMINOPHEN 5-325 MG PO TABS: ORAL_TABLET | ORAL | Status: DC

## 2012-08-13 MED ORDER — OXYCODONE HCL 5 MG PO TABS
5.0000 mg | ORAL_TABLET | Freq: Once | ORAL | Status: AC | PRN
  Administered 2012-08-13: 5 mg via ORAL

## 2012-08-13 MED ORDER — BUPIVACAINE HCL (PF) 0.25 % IJ SOLN
INTRAMUSCULAR | Status: DC | PRN
  Administered 2012-08-13: 10 mL

## 2012-08-13 MED ORDER — DEXAMETHASONE SODIUM PHOSPHATE 4 MG/ML IJ SOLN
INTRAMUSCULAR | Status: DC | PRN
  Administered 2012-08-13: 8 mg via INTRAVENOUS

## 2012-08-13 MED ORDER — FENTANYL CITRATE 0.05 MG/ML IJ SOLN
INTRAMUSCULAR | Status: DC | PRN
  Administered 2012-08-13: 100 ug via INTRAVENOUS
  Administered 2012-08-13: 25 ug via INTRAVENOUS
  Administered 2012-08-13: 50 ug via INTRAVENOUS
  Administered 2012-08-13: 25 ug via INTRAVENOUS

## 2012-08-13 MED ORDER — MIDAZOLAM HCL 5 MG/5ML IJ SOLN
INTRAMUSCULAR | Status: DC | PRN
  Administered 2012-08-13: 2 mg via INTRAVENOUS

## 2012-08-13 MED ORDER — LIDOCAINE HCL (CARDIAC) 20 MG/ML IV SOLN
INTRAVENOUS | Status: DC | PRN
  Administered 2012-08-13: 100 mg via INTRAVENOUS

## 2012-08-13 MED ORDER — ONDANSETRON HCL 4 MG/2ML IJ SOLN: 4.0000 mg | Freq: Once | INTRAMUSCULAR | Status: DC | PRN

## 2012-08-13 MED ORDER — PROPOFOL 10 MG/ML IV BOLUS
INTRAVENOUS | Status: DC | PRN
  Administered 2012-08-13: 200 mg via INTRAVENOUS

## 2012-08-13 MED ORDER — LACTATED RINGERS IV SOLN
INTRAVENOUS | Status: DC
  Administered 2012-08-13 (×2): via INTRAVENOUS

## 2012-08-13 MED ORDER — MEPERIDINE HCL 25 MG/ML IJ SOLN: 6.2500 mg | INTRAMUSCULAR | Status: DC | PRN

## 2012-08-13 MED ORDER — CEFAZOLIN SODIUM-DEXTROSE 2-3 GM-% IV SOLR: 2.0000 g | INTRAVENOUS | Status: DC

## 2012-08-13 MED ORDER — DEXTROSE 5 % IV SOLN
3.0000 g | Freq: Once | INTRAVENOUS | Status: AC
  Administered 2012-08-13: 3 g via INTRAVENOUS

## 2012-08-13 MED ORDER — ONDANSETRON HCL 4 MG/2ML IJ SOLN
INTRAMUSCULAR | Status: DC | PRN
  Administered 2012-08-13: 4 mg via INTRAVENOUS

## 2012-08-13 MED ORDER — HYDROMORPHONE HCL PF 1 MG/ML IJ SOLN
0.2500 mg | INTRAMUSCULAR | Status: DC | PRN
  Administered 2012-08-13 (×4): 0.5 mg via INTRAVENOUS

## 2012-08-13 MED ORDER — CHLORHEXIDINE GLUCONATE 4 % EX LIQD: 60.0000 mL | Freq: Once | CUTANEOUS | Status: DC

## 2012-08-13 MED ORDER — OXYCODONE HCL 5 MG/5ML PO SOLN: 5.0000 mg | Freq: Once | ORAL | Status: AC | PRN

## 2012-08-13 MED ORDER — VITAMIN C 500 MG PO TABS: 500.0000 mg | ORAL_TABLET | Freq: Every day | ORAL | Status: DC

## 2012-08-13 SURGICAL SUPPLY — 48 items
BANDAGE COBAN STERILE 2 (GAUZE/BANDAGES/DRESSINGS) IMPLANT
BANDAGE CONFORM 2  STR LF (GAUZE/BANDAGES/DRESSINGS) ×2 IMPLANT
BANDAGE ELASTIC 3 VELCRO ST LF (GAUZE/BANDAGES/DRESSINGS) ×2 IMPLANT
BANDAGE GAUZE ELAST BULKY 4 IN (GAUZE/BANDAGES/DRESSINGS) ×2 IMPLANT
BENZOIN TINCTURE PRP APPL 2/3 (GAUZE/BANDAGES/DRESSINGS) IMPLANT
BLADE MINI RND TIP GREEN BEAV (BLADE) IMPLANT
BLADE SURG 15 STRL LF DISP TIS (BLADE) ×2 IMPLANT
BLADE SURG 15 STRL SS (BLADE) ×2
BNDG ELASTIC 2 VLCR STRL LF (GAUZE/BANDAGES/DRESSINGS) ×2 IMPLANT
BNDG ESMARK 4X9 LF (GAUZE/BANDAGES/DRESSINGS) ×2 IMPLANT
CHLORAPREP W/TINT 26ML (MISCELLANEOUS) ×2 IMPLANT
CLOTH BEACON ORANGE TIMEOUT ST (SAFETY) ×2 IMPLANT
CORDS BIPOLAR (ELECTRODE) ×2 IMPLANT
COVER MAYO STAND STRL (DRAPES) ×2 IMPLANT
COVER TABLE BACK 60X90 (DRAPES) ×2 IMPLANT
CUFF TOURNIQUET SINGLE 18IN (TOURNIQUET CUFF) ×2 IMPLANT
DRAPE EXTREMITY T 121X128X90 (DRAPE) ×2 IMPLANT
DRAPE SURG 17X23 STRL (DRAPES) ×2 IMPLANT
DRSG PAD ABDOMINAL 8X10 ST (GAUZE/BANDAGES/DRESSINGS) IMPLANT
GAUZE XEROFORM 1X8 LF (GAUZE/BANDAGES/DRESSINGS) ×2 IMPLANT
GLOVE BIO SURGEON STRL SZ7 (GLOVE) ×2 IMPLANT
GLOVE BIO SURGEON STRL SZ7.5 (GLOVE) ×2 IMPLANT
GLOVE BIOGEL PI IND STRL 7.5 (GLOVE) ×1 IMPLANT
GLOVE BIOGEL PI IND STRL 8 (GLOVE) ×1 IMPLANT
GLOVE BIOGEL PI INDICATOR 7.5 (GLOVE) ×1
GLOVE BIOGEL PI INDICATOR 8 (GLOVE) ×1
GOWN BRE IMP PREV XXLGXLNG (GOWN DISPOSABLE) ×2 IMPLANT
GOWN PREVENTION PLUS XLARGE (GOWN DISPOSABLE) ×2 IMPLANT
GOWN PREVENTION PLUS XXLARGE (GOWN DISPOSABLE) ×2 IMPLANT
NEEDLE HYPO 25X1 1.5 SAFETY (NEEDLE) ×2 IMPLANT
NS IRRIG 1000ML POUR BTL (IV SOLUTION) ×2 IMPLANT
PACK BASIN DAY SURGERY FS (CUSTOM PROCEDURE TRAY) ×2 IMPLANT
PAD CAST 3X4 CTTN HI CHSV (CAST SUPPLIES) IMPLANT
PADDING CAST ABS 4INX4YD NS (CAST SUPPLIES) ×1
PADDING CAST ABS COTTON 4X4 ST (CAST SUPPLIES) ×1 IMPLANT
PADDING CAST COTTON 3X4 STRL (CAST SUPPLIES)
SPLINT PLASTER CAST XFAST 3X15 (CAST SUPPLIES) ×10 IMPLANT
SPLINT PLASTER XTRA FASTSET 3X (CAST SUPPLIES) ×10
SPONGE GAUZE 4X4 12PLY (GAUZE/BANDAGES/DRESSINGS) ×2 IMPLANT
STOCKINETTE 4X48 STRL (DRAPES) ×2 IMPLANT
STRIP CLOSURE SKIN 1/2X4 (GAUZE/BANDAGES/DRESSINGS) IMPLANT
SUT ETHILON 4 0 PS 2 18 (SUTURE) ×4 IMPLANT
SUT MNCRL AB 4-0 PS2 18 (SUTURE) IMPLANT
SUT VIC AB 4-0 P2 18 (SUTURE) IMPLANT
SYR BULB 3OZ (MISCELLANEOUS) ×2 IMPLANT
SYR CONTROL 10ML LL (SYRINGE) ×2 IMPLANT
TOWEL OR 17X24 6PK STRL BLUE (TOWEL DISPOSABLE) ×2 IMPLANT
UNDERPAD 30X30 INCONTINENT (UNDERPADS AND DIAPERS) ×2 IMPLANT

## 2012-08-13 NOTE — Op Note (Signed)
[id number] 

## 2012-08-13 NOTE — Anesthesia Preprocedure Evaluation (Addendum)
Anesthesia Evaluation  Patient identified by MRN, date of birth, ID band Patient awake    Reviewed: Allergy & Precautions, H&P , NPO status , Patient's Chart, lab work & pertinent test results  Airway Mallampati: I TM Distance: >3 FB Neck ROM: Full    Dental   Pulmonary asthma , sleep apnea ,          Cardiovascular hypertension, Pt. on medications     Neuro/Psych    GI/Hepatic   Endo/Other  diabetes, Well Controlled, Type 2, Oral Hypoglycemic Agents  Renal/GU      Musculoskeletal   Abdominal   Peds  Hematology   Anesthesia Other Findings   Reproductive/Obstetrics                           Anesthesia Physical Anesthesia Plan  ASA: III  Anesthesia Plan: Bier Block and General   Post-op Pain Management:    Induction: Intravenous  Airway Management Planned: LMA  Additional Equipment:   Intra-op Plan:   Post-operative Plan: Extubation in OR  Informed Consent: I have reviewed the patients History and Physical, chart, labs and discussed the procedure including the risks, benefits and alternatives for the proposed anesthesia with the patient or authorized representative who has indicated his/her understanding and acceptance.     Plan Discussed with: CRNA and Surgeon  Anesthesia Plan Comments:        Anesthesia Quick Evaluation

## 2012-08-13 NOTE — Brief Op Note (Signed)
08/13/2012  10:59 AM  PATIENT:  Christian Bowen  52 y.o. male  PRE-OPERATIVE DIAGNOSIS:  LEFT DEQUERVAINS AND DORSAL GANGLION  POST-OPERATIVE DIAGNOSIS:  LEFT DEQUERVAINS AND DORSAL GANGLION  PROCEDURE:  Procedure(s): RELEASE DORSAL COMPARTMENT (DEQUERVAIN) and exploration of radial nerve branch REMOVAL GANGLION OF WRIST  SURGEON:  Surgeon(s): Tami Ribas, MD  PHYSICIAN ASSISTANT:   ASSISTANTS: none   ANESTHESIA:   general  EBL:  Total I/O In: 1000 [I.V.:1000] Out: -   DRAINS: none   LOCAL MEDICATIONS USED:  MARCAINE     SPECIMEN:  Source of Specimen:  left wrist  DISPOSITION OF SPECIMEN:  PATHOLOGY  COUNTS:  YES  TOURNIQUET:   Total Tourniquet Time Documented: Upper Arm (Left) - 52 minutes Total: Upper Arm (Left) - 52 minutes   DICTATION: .Other Dictation: Dictation Number [id number]  PLAN OF CARE: Discharge to home after PACU

## 2012-08-13 NOTE — H&P (Signed)
Christian Bowen is an 52 y.o. male.   Chief Complaint: recurrent left ganglion and DeQuervain's tenosynovitis HPI: 52 yo rhd male 6 months s/p ganglion excision and dequervains release of left wrist.  He states the ganglion has recurred and he still has pain at the radial side of the wrist.  Also notes decreased sensation in the radial dorsum of his hand.  He wishes to have a reexcision of the mass and exploration of the 1st dorsal compartment and superficial branch of radial nerve.  Past Medical History  Diagnosis Date  . Hypertension   . Diabetes mellitus   . Asthma   . Obesity   . Sleep apnea     CPAP-has had UPPP-tonsils and turb red    Past Surgical History  Procedure Laterality Date  . Heel spurs      bilateral feel  . Spinal cord stimulator implant  1997  . Back surgery  2009  . Nasal turbinate reduction  8/13    septoplasty-UPPP-tonsils  . Ganglion cyst excision  2/14-[facility]    lt wrist   . Tonsillectomy      History reviewed. No pertinent family history. Social History:  reports that he has been smoking.  He does not have any smokeless tobacco history on file. He reports that  drinks alcohol. He reports that he does not use illicit drugs.  Allergies: No Known Allergies  Medications Prior to Admission  Medication Sig Dispense Refill  . albuterol (PROVENTIL HFA;VENTOLIN HFA) 108 (90 BASE) MCG/ACT inhaler Inhale 2 puffs into the lungs every 6 (six) hours as needed.      Marland Kitchen atorvastatin (LIPITOR) 40 MG tablet Take 40 mg by mouth daily.      Marland Kitchen lisinopril-hydrochlorothiazide (PRINZIDE,ZESTORETIC) 20-25 MG per tablet Take 1 tablet by mouth daily.      . sitaGLIPtan-metformin (JANUMET) 50-1000 MG per tablet Take 1 tablet by mouth 2 (two) times daily with a meal.        Results for orders placed during the hospital encounter of 08/13/12 (from the past 48 hour(s))  POCT I-STAT, CHEM 8     Status: Abnormal   Collection Time    08/13/12  8:17 AM      Result Value Range   Sodium  143  135 - 145 mEq/L   Potassium 4.1  3.5 - 5.1 mEq/L   Chloride 102  96 - 112 mEq/L   BUN 18  6 - 23 mg/dL   Creatinine, Ser 0.98  0.50 - 1.35 mg/dL   Glucose, Bld 119 (*) 70 - 99 mg/dL   Calcium, Ion 1.47  8.29 - 1.23 mmol/L   TCO2 26  0 - 100 mmol/L   Hemoglobin 15.6  13.0 - 17.0 g/dL   HCT 56.2  13.0 - 86.5 %    No results found.   A comprehensive review of systems was negative except for: Respiratory: positive for asthma and sleep apnea  Blood pressure 129/87, pulse 91, temperature 98.1 F (36.7 C), temperature source Oral, resp. rate 20, height 6' (1.829 m), weight 316 lb (143.337 kg), SpO2 96.00%.  General appearance: alert, cooperative and appears stated age Head: Normocephalic, without obvious abnormality, atraumatic Neck: supple, symmetrical, trachea midline Resp: clear to auscultation bilaterally Cardio: regular rate and rhythm GI: non tender Extremities: intact sensation and capillary refill all digits.  +epl/fpl/io.  dorsal mass on left wrist.  +finkelsteins. decreased sensation on dorsum of hand.  no skin wounds. Pulses: 2+ and symmetric Skin: Skin color, texture, turgor normal. No  rashes or lesions Neurologic: Grossly normal Incision/Wound: na  Assessment/Plan Recurrent left dorsal wrist ganglion, dequervains tenosynovitis and possible superficial branch radial nerve injury.  Non operative and operative treatment options were discussed with the patient and patient wishes to proceed with operative treatment. Risks, benefits, and alternatives of surgery were discussed and the patient agrees with the plan of care.   Zury Fazzino R 08/13/2012, 9:33 AM

## 2012-08-13 NOTE — Transfer of Care (Signed)
Immediate Anesthesia Transfer of Care Note  Patient: Christian Bowen  Procedure(s) Performed: Procedure(s): RELEASE DORSAL COMPARTMENT (DEQUERVAIN) and exploration of radial nerve branch (Left) REMOVAL GANGLION OF WRIST (Left)  Patient Location: PACU  Anesthesia Type:General  Level of Consciousness: sedated  Airway & Oxygen Therapy: Patient Spontanous Breathing and Patient connected to face mask oxygen  Post-op Assessment: Report given to PACU RN and Post -op Vital signs reviewed and stable  Post vital signs: Reviewed and stable  Complications: No apparent anesthesia complications

## 2012-08-13 NOTE — Op Note (Signed)
NAMEJAIDAN, Christian Bowen             ACCOUNT NO.:  [id number]  MEDICAL RECORD NO.:  [id number]  LOCATION:                                 FACILITY:  PHYSICIAN:  Betha Loa, MD             DATE OF BIRTH:  DATE OF PROCEDURE:  08/13/2012 DATE OF DISCHARGE:                              OPERATIVE REPORT   PREOPERATIVE DIAGNOSIS:  Left recurrent dorsal ganglion cyst, recurrent de Quervain's tenosynovitis and superficial branch radial nerve irritation.  POSTOPERATIVE DIAGNOSIS:  Left recurrent dorsal ganglion cyst, recurrent de Quervain's tenosynovitis and superficial branch radial nerve irritation.  PROCEDURE:   1. Left wrist excision dorsal ganglion cyst 2. Left wrist release of first dorsal compartment 3. Left wrist exploration of the superficial branch radial nerve  with neurolysis  SURGEON:  Betha Loa, MD.  ASSISTANTS:  None.  ANESTHESIA:  General.  IV FLUIDS:  Per anesthesia flow sheet.  ESTIMATED BLOOD LOSS:  Minimal.  COMPLICATIONS:  None.  SPECIMENS:  Left wrist dorsal ganglion cyst to pathology.  Tourniquet time 52 minutes.  DISPOSITION:  Stable to PACU.  INDICATIONS:  Mr. Christian Bowen is a 51 year old male who 6 months ago had excision of left wrist dorsal ganglion cyst and release of the first dorsal compartment.  He has noticed a recurrence of the ganglion and continued pain at the radial side of the wrist.  He has also noticed decreased sensation on the dorsal radial aspect of the hand.  He wishes to have the cyst re-excised.  He also wished to have exploration of the nerve and examination of the first dorsal compartment for potential septae.  Risks, benefits, and alternatives of surgery were discussed including the risk of blood loss; infection; damage to nerves, vessels, tendons, ligaments, bone; failure of surgery; need for additional surgery; complications with wound healing; continued pain; recurrence of the cyst and de Quervain's.  He voiced  understanding of these and elected to proceed.  OPERATIVE COURSE:  After being identified preoperatively by myself, the patient and I agreed upon procedure and site procedure.  Surgical site was marked.  The risks, benefits, and alternatives of surgery were reviewed and wished to proceed.  Surgical consent had been signed.  He was given IV Ancef as preoperative antibiotic prophylaxis.  He was transferred to the operating room and placed on the operating room table in supine position with left upper extremity on arm board.  General anesthesia was induced by the anesthesiologist.  The left upper extremity was prepped and draped in the normal sterile orthopedic fashion.  Surgical pause was performed between surgeons, anesthesia, operating staff, and all were in agreement as to the patient, procedure, and site of procedure.  Tourniquet at the proximal aspect of the extremity was inflated to 250 mmHg after exsanguination of the limb with an Esmarch bandage.  An incision was made at the radial side of the left wrist incorporating the previous incision.  This was made through the skin only.  Subcutaneous tissues were entered by spreading technique. Small branch of the radial nerve was identified directly under the incision.  This was traced out.  It was intact.  This was slightly volar to  the midline of the radial side of the radius.  The dorsal tissues were freed up.  A larger branch of the superficial branch of the radial nerve was identified.  This was followed as well.  There was scar around this as well as there was on the more anterior branch of the nerve.  The dorsal nerve was visualized branching.  The branches were followed, and all were intact.  There was no neuroma formation.  The scar around the nerve was carefully freed up.  Care was taken to protect all nerve branches and the main trunk of the nerve as well.  The nerve was then lightly retracted.  The ganglion cyst was easily  identified.  It was carefully freed up the surrounding tissues.  There was dense scar. There was a vein over top of the cyst, which was able to be freed up as well.  The superficial branch of the radial nerve had been adherent to the cyst.  This was freed up.  The cyst appeared to be coming from the first dorsal compartment.  It was traced back to this area.  It was freed up of all surrounding soft tissue attachments and removed and sent to Pathology for examination.  The first dorsal compartment was entered. There was a thin septum between the APL and EPB tendons, which was released.  This did not appear to be causing compression, however.  The compartment was examined, and it was decompressed from the muscle belly, all the way distal to the radial styloid.  A Glorious Peach was placed into the compartment, and there was a generous space for the tendons.  The source of the ganglion cyst was treated with bipolar electrocautery to try and induce scarring.  There was some scar adhesion of the distal muscle bellies of the APL and EPB tendons to the surrounding tissues, which was released.  The wrist was placed through a range of motion to ensure there was no subluxation of the APL and EPB tendons, which there was not.  The wound was copiously irrigated with sterile saline.  It was closed with 4-0 nylon in a horizontal mattress fashion.  It was injected with 10 mL of 0.25% plain Marcaine to aid in postoperative analgesia. The wound was then dressed with sterile Xeroform, 4 x 4's, and wrapped with a Kerlix bandage.  A volar splint was placed and wrapped with Kerlix and Ace bandage.  The operative drapes were broken down. Tourniquet was deflated at 52 minutes.  Fingertips were pink with brisk capillary refill after deflation of tourniquet.  The patient was transferred back to stretcher and taken to PACU in stable condition.  I will see him back in the office in 1 week for postoperative followup.  I will  give Percocet 5/325 one to two p.o. q.6 hours p.r.n. pain, dispensed #40.     Betha Loa, MD     KK/MEDQ  D:  08/13/2012  T:  08/13/2012  Job:  [id number]

## 2012-08-13 NOTE — Anesthesia Procedure Notes (Signed)
Procedure Name: LMA Insertion Date/Time: 08/13/2012 9:45 AM Performed by: Caren Macadam Pre-anesthesia Checklist: Patient identified, Emergency Drugs available, Suction available and Patient being monitored Patient Re-evaluated:Patient Re-evaluated prior to inductionOxygen Delivery Method: Circle System Utilized Preoxygenation: Pre-oxygenation with 100% oxygen Intubation Type: IV induction Ventilation: Mask ventilation without difficulty LMA: LMA with gastric port inserted LMA Size: 5.0 Number of attempts: 1 Airway Equipment and Method: bite block Placement Confirmation: positive ETCO2 and breath sounds checked- equal and bilateral Tube secured with: Tape Dental Injury: Teeth and Oropharynx as per pre-operative assessment

## 2012-08-13 NOTE — Anesthesia Postprocedure Evaluation (Signed)
Anesthesia Post Note  Patient: Christian Bowen  Procedure(s) Performed: Procedure(s) (LRB): RELEASE DORSAL COMPARTMENT (DEQUERVAIN) and exploration of radial nerve branch (Left) REMOVAL GANGLION OF WRIST (Left)  Anesthesia type: general  Patient location: PACU  Post pain: Pain level controlled  Post assessment: Patient's Cardiovascular Status Stable  Last Vitals:  Filed Vitals:   08/13/12 1238  BP: 128/81  Pulse: 76  Temp: 36.6 C  Resp: 16    Post vital signs: Reviewed and stable  Level of consciousness: sedated  Complications: No apparent anesthesia complications

## 2012-08-14 ENCOUNTER — Encounter (HOSPITAL_BASED_OUTPATIENT_CLINIC_OR_DEPARTMENT_OTHER): Payer: Self-pay | Admitting: Orthopedic Surgery

## 2012-09-19 ENCOUNTER — Encounter (HOSPITAL_COMMUNITY): Payer: Self-pay | Admitting: *Deleted

## 2012-09-19 ENCOUNTER — Emergency Department (INDEPENDENT_AMBULATORY_CARE_PROVIDER_SITE_OTHER)
Admission: EM | Admit: 2012-09-19 | Discharge: 2012-09-19 | Disposition: A | Discharge status: Home / Self Care | Payer: Medicare Other | Source: Home / Self Care | Attending: Family Medicine | Admitting: Family Medicine

## 2012-09-19 DIAGNOSIS — M67919 Unspecified disorder of synovium and tendon, unspecified shoulder: Secondary | ICD-10-CM

## 2012-09-19 DIAGNOSIS — M7551 Bursitis of right shoulder: Secondary | ICD-10-CM

## 2012-09-19 MED ORDER — ZOLPIDEM TARTRATE 10 MG PO TABS: 10.0000 mg | ORAL_TABLET | Freq: Every evening | ORAL | Status: DC | PRN

## 2012-09-19 MED ORDER — TRIAMCINOLONE ACETONIDE 40 MG/ML IJ SUSP
INTRAMUSCULAR | Status: AC
  Filled 2012-09-19: qty 2

## 2012-09-19 NOTE — ED Provider Notes (Addendum)
CSN: [id number]     Arrival date & time 09/19/12  1653 History     First MD Initiated Contact with Patient 09/19/12 1808     Chief Complaint  Patient presents with  . Shoulder Pain   (Consider location/radiation/quality/duration/timing/severity/associated sxs/prior Treatment) Patient is a 52 y.o. male presenting with shoulder pain. The history is provided by the patient.  Shoulder Pain This is a new problem. The current episode started more than 2 days ago (had surgery on left shoulder by dr Madelon Lips, now with right shoulder relapse s/p inj 4mos ago.). The problem has been gradually worsening. Pertinent negatives include no chest pain and no abdominal pain. He has tried a cold compress for the symptoms. The treatment provided no relief.    Past Medical History  Diagnosis Date  . Hypertension   . Diabetes mellitus   . Asthma   . Obesity   . Sleep apnea     CPAP-has had UPPP-tonsils and turb red   Past Surgical History  Procedure Laterality Date  . Heel spurs      bilateral feel  . Spinal cord stimulator implant  1997  . Back surgery  2009  . Nasal turbinate reduction  8/13    septoplasty-UPPP-tonsils  . Ganglion cyst excision  2/14-[facility]    lt wrist   . Tonsillectomy    . Dorsal compartment release Left 08/13/2012    Procedure: RELEASE DORSAL COMPARTMENT (DEQUERVAIN) and exploration of radial nerve branch;  Surgeon: Tami Ribas, MD;  Location: [facility];  Service: Orthopedics;  Laterality: Left;  . Ganglion cyst excision Left 08/13/2012    Procedure: REMOVAL GANGLION OF WRIST;  Surgeon: Tami Ribas, MD;  Location:  SUR[facility]ERY CENTER;  Service: Orthopedics;  Laterality: Left;   History reviewed. No pertinent family history. History  Substance Use Topics  . Smoking status: Current Every Day Smoker -- 0.25 packs/day  . Smokeless tobacco: Not on file  . Alcohol Use: Yes     Comment: occ    Review of Systems  Constitutional: Negative.     Cardiovascular: Negative for chest pain.  Gastrointestinal: Negative for abdominal pain.  Musculoskeletal: Positive for joint swelling.  Skin: Negative.     Allergies  Review of patient's allergies indicates no known allergies.  Home Medications   Current Outpatient Rx  Name  Route  Sig  Dispense  Refill  . albuterol (PROVENTIL HFA;VENTOLIN HFA) 108 (90 BASE) MCG/ACT inhaler   Inhalation   Inhale 2 puffs into the lungs every 6 (six) hours as needed.         Marland Kitchen atorvastatin (LIPITOR) 40 MG tablet   Oral   Take 40 mg by mouth daily.         Marland Kitchen lisinopril-hydrochlorothiazide (PRINZIDE,ZESTORETIC) 20-25 MG per tablet   Oral   Take 1 tablet by mouth daily.         . sitaGLIPtan-metformin (JANUMET) 50-1000 MG per tablet   Oral   Take 1 tablet by mouth 2 (two) times daily with a meal.         . oxyCODONE-acetaminophen (PERCOCET) 5-325 MG per tablet      1-2 tabs po q6 hours prn pain   40 tablet   0   . vitamin C (ASCORBIC ACID) 500 MG tablet   Oral   Take 1 tablet (500 mg total) by mouth daily.   30 tablet   0   . zolpidem (AMBIEN) 10 MG tablet   Oral   Take 1  tablet (10 mg total) by mouth at bedtime as needed for sleep.   10 tablet   0    BP 113/83  Pulse 111  Temp(Src) 98.1 F (36.7 C) (Oral)  Resp 18  SpO2 96% Physical Exam  Nursing note and vitals reviewed. Constitutional: He is oriented to person, place, and time. He appears well-developed and well-nourished.  Musculoskeletal: He exhibits tenderness.       Right shoulder: He exhibits decreased range of motion, tenderness, bony tenderness and pain. He exhibits no effusion and normal pulse.       Arms: Neurological: He is alert and oriented to person, place, and time.  Skin: Skin is warm and dry.    ED Course   ARTHOCENTESIS Date/Time: 09/19/2012 6:14 PM Performed by: Linna Hoff Authorized by: Bradd Canary D Consent: Verbal consent obtained. Risks and benefits: risks, benefits and  alternatives were discussed Consent given by: patient Indications: pain  Body area: shoulder Joint: right subacromial bursa Local anesthesia used: no Patient sedated: no Preparation: Patient was prepped and draped in the usual sterile fashion. Needle gauge: 22 G Approach: lateral Triamcinolone amount: 2 mg Bupivacaine 0.50% amount: 5 ml Patient tolerance: Patient tolerated the procedure well with no immediate complications. Comments: Sx improved.   (including critical care time)  Labs Reviewed - No data to display No results found. 1. Bursitis of shoulder region, right     MDM  Sx much improved after shoulder injection.  Linna Hoff, MD 09/19/12 1821  Linna Hoff, MD 09/19/12 Rickey Primus

## 2012-09-19 NOTE — ED Notes (Signed)
Patient complains of right shoulder pain; pain keeps him up at night and can not sleep; pain started 4 days ago.

## 2012-09-20 ENCOUNTER — Emergency Department (INDEPENDENT_AMBULATORY_CARE_PROVIDER_SITE_OTHER)
Admission: EM | Admit: 2012-09-20 | Discharge: 2012-09-20 | Disposition: A | Discharge status: Home / Self Care | Payer: Medicare Other | Source: Home / Self Care

## 2012-09-20 ENCOUNTER — Encounter (HOSPITAL_COMMUNITY): Payer: Self-pay | Admitting: *Deleted

## 2012-09-20 DIAGNOSIS — M25511 Pain in right shoulder: Secondary | ICD-10-CM

## 2012-09-20 DIAGNOSIS — M25519 Pain in unspecified shoulder: Secondary | ICD-10-CM

## 2012-09-20 MED ORDER — HYDROCODONE-ACETAMINOPHEN 5-325 MG PO TABS: 2.0000 | ORAL_TABLET | ORAL | Status: DC | PRN

## 2012-09-20 NOTE — ED Provider Notes (Addendum)
Christian Bowen is a 52 y.o. male who presents to Urgent Care today for shoulder pain. Patient has right subacromial bursitis and had a subacromial injection yesterday at this clinic. He did well for several hours however following the injection after several hours he had worsening pain. He notes moderate pain in his right shoulder. The pain is worse with activity and at night. He denies any radiating pain weakness numbness fevers or chills. He's tried over-the-counter medications for pain which is only helped a bit. He has an appointment with his orthopedic surgeon on Wednesday.  Of note patient took his albuterol just prior to arrival   PMH reviewed. Chronic back pain History  Substance Use Topics  . Smoking status: Current Every Day Smoker -- 0.25 packs/day  . Smokeless tobacco: Not on file  . Alcohol Use: Yes     Comment: occ   ROS as above Medications reviewed. No current facility-administered medications for this encounter.   Current Outpatient Prescriptions  Medication Sig Dispense Refill  . albuterol (PROVENTIL HFA;VENTOLIN HFA) 108 (90 BASE) MCG/ACT inhaler Inhale 2 puffs into the lungs every 6 (six) hours as needed.      Marland Kitchen atorvastatin (LIPITOR) 40 MG tablet Take 40 mg by mouth daily.      Marland Kitchen lisinopril-hydrochlorothiazide (PRINZIDE,ZESTORETIC) 20-25 MG per tablet Take 1 tablet by mouth daily.      Marland Kitchen oxyCODONE-acetaminophen (PERCOCET) 5-325 MG per tablet 1-2 tabs po q6 hours prn pain  40 tablet  0  . sitaGLIPtan-metformin (JANUMET) 50-1000 MG per tablet Take 1 tablet by mouth 2 (two) times daily with a meal.      . vitamin C (ASCORBIC ACID) 500 MG tablet Take 1 tablet (500 mg total) by mouth daily.  30 tablet  0  . zolpidem (AMBIEN) 10 MG tablet Take 1 tablet (10 mg total) by mouth at bedtime as needed for sleep.  10 tablet  0  . HYDROcodone-acetaminophen (NORCO/VICODIN) 5-325 MG per tablet Take 2 tablets by mouth every 4 (four) hours as needed for pain.  20 tablet  0    Exam:   BP 115/80  Pulse 114  Temp(Src) 97.7 F (36.5 C) (Oral)  Resp 23  SpO2 95% Gen: Well NAD RIGHT SHOULDER: Normal-appearing no erythema at the injection site. Normal external rotation and internal rotation. Abduction limited to about 120 by pain.  Positive impingement testing. Capillary refill strength and sensation are intact distally  No results found for this or any previous visit (from the past 24 hour(s)). No results found.  Assessment and Plan: 52 y.o. male with steroid flare.  Patient is having a steroid flare in his subacromial bursa is resolving injection. There are no signs of infection and it is a bit too early following injection for a septic joint.  He has no fever.  Additionally I believe his elevated heart rate is due to his albuterol usage just prior to arrival. Plan: Small amount of hydrocodone for pain as needed. Followup with orthopedics on Wednesday. Discussed warning signs or symptoms. Please see discharge instructions. Patient expresses understanding.      Rodolph Bong, MD 09/20/12 1654  Rodolph Bong, MD 09/20/12 [phone]

## 2012-09-20 NOTE — ED Notes (Signed)
Reports seeing Dr. Artis Flock in [facility] yesterday & received cortisone injection in shoulder; c/o no pain relief.

## 2012-09-21 ENCOUNTER — Other Ambulatory Visit: Payer: Self-pay | Admitting: Orthopedic Surgery

## 2012-09-21 DIAGNOSIS — M25511 Pain in right shoulder: Secondary | ICD-10-CM

## 2012-09-30 ENCOUNTER — Ambulatory Visit
Admission: RE | Admit: 2012-09-30 | Discharge: 2012-09-30 | Disposition: A | Discharge status: Home / Self Care | Payer: Medicare Other | Source: Ambulatory Visit | Attending: Orthopedic Surgery | Admitting: Orthopedic Surgery

## 2012-09-30 DIAGNOSIS — M25511 Pain in right shoulder: Secondary | ICD-10-CM

## 2012-09-30 MED ORDER — IOHEXOL 180 MG/ML  SOLN
13.0000 mL | Freq: Once | INTRAMUSCULAR | Status: AC | PRN
  Administered 2012-09-30: 13 mL via INTRA_ARTICULAR

## 2012-11-19 ENCOUNTER — Other Ambulatory Visit (HOSPITAL_COMMUNITY): Payer: Self-pay | Admitting: Neurosurgery

## 2012-11-19 ENCOUNTER — Other Ambulatory Visit: Payer: Self-pay | Admitting: Neurosurgery

## 2012-11-19 DIAGNOSIS — M47817 Spondylosis without myelopathy or radiculopathy, lumbosacral region: Secondary | ICD-10-CM

## 2012-11-19 DIAGNOSIS — M4000 Postural kyphosis, site unspecified: Secondary | ICD-10-CM

## 2012-12-08 ENCOUNTER — Other Ambulatory Visit (HOSPITAL_COMMUNITY): Payer: Medicare Other

## 2012-12-17 ENCOUNTER — Encounter (HOSPITAL_COMMUNITY): Payer: Self-pay | Admitting: Emergency Medicine

## 2012-12-17 ENCOUNTER — Emergency Department (INDEPENDENT_AMBULATORY_CARE_PROVIDER_SITE_OTHER)
Admission: EM | Admit: 2012-12-17 | Discharge: 2012-12-17 | Disposition: A | Discharge status: Home / Self Care | Payer: PRIVATE HEALTH INSURANCE | Source: Home / Self Care | Attending: Emergency Medicine | Admitting: Emergency Medicine

## 2012-12-17 DIAGNOSIS — J45901 Unspecified asthma with (acute) exacerbation: Secondary | ICD-10-CM

## 2012-12-17 DIAGNOSIS — J069 Acute upper respiratory infection, unspecified: Secondary | ICD-10-CM

## 2012-12-17 MED ORDER — BENZONATATE 200 MG PO CAPS: 200.0000 mg | ORAL_CAPSULE | Freq: Three times a day (TID) | ORAL | Status: DC | PRN

## 2012-12-17 MED ORDER — PREDNISONE 10 MG PO KIT: PACK | ORAL | Status: DC

## 2012-12-17 MED ORDER — HYDROCOD POLST-CHLORPHEN POLST 10-8 MG/5ML PO LQCR: 5.0000 mL | Freq: Two times a day (BID) | ORAL | Status: DC | PRN

## 2012-12-17 NOTE — ED Provider Notes (Signed)
Medical screening examination/treatment/procedure(s) were performed by non-physician practitioner and as supervising physician I was immediately available for consultation/collaboration.  Leslee Home, M.D.  Reuben Likes, MD 12/17/12 1435

## 2012-12-17 NOTE — ED Notes (Signed)
C/o cough and sore throat for two days OTC medication taken but no relief.   Denies diarrhea and vomiting.

## 2012-12-17 NOTE — ED Provider Notes (Signed)
CSN: [id number]     Arrival date & time 12/17/12  4098 History   First MD Initiated Contact with Patient 12/17/12 1055     Chief Complaint  Patient presents with  . Cough  . URI   (Consider location/radiation/quality/duration/timing/severity/associated sxs/prior Treatment) HPI Comments: 52 year old male with history of asthma presents complaining of 3 days of cough. The cough has been severe and has been keeping him up at night. He has been treating with over-the-counter medications with only mild temporary relief in his symptoms. The cough has become severe enough to where he has now developed some soreness in his chest with coughing in his throat is very raw. Additionally, he is very fatigued because the cough has been keeping him awake at night. He denies fever, chills, shortness of breath, NVD. He has had pneumonia in the past and it felt nothing like this. He does smoke cigarettes casually, not everyday. No recent travel or sick contacts. No leg swelling. He has been using his albuterol at, most recently this morning  Patient is a 52 y.o. male presenting with cough and URI.  Cough Associated symptoms: sore throat   Associated symptoms: no chest pain, no chills, no fever, no myalgias, no rash and no shortness of breath   URI Presenting symptoms: cough, fatigue and sore throat   Presenting symptoms: no fever   Associated symptoms: no arthralgias, no myalgias and no neck pain     Past Medical History  Diagnosis Date  . Hypertension   . Diabetes mellitus   . Asthma   . Obesity   . Sleep apnea     CPAP-has had UPPP-tonsils and turb red   Past Surgical History  Procedure Laterality Date  . Heel spurs      bilateral feel  . Spinal cord stimulator implant  1997  . Back surgery  2009  . Nasal turbinate reduction  8/13    septoplasty-UPPP-tonsils  . Ganglion cyst excision  2/14-[facility]    lt wrist   . Tonsillectomy    . Dorsal compartment release Left 08/13/2012    Procedure: RELEASE  DORSAL COMPARTMENT (DEQUERVAIN) and exploration of radial nerve branch;  Surgeon: Tami Ribas, MD;  Location: [facility];  Service: Orthopedics;  Laterality: Left;  . Ganglion cyst excision Left 08/13/2012    Procedure: REMOVAL GANGLION OF WRIST;  Surgeon: Tami Ribas, MD;  Location: [facility];  Service: Orthopedics;  Laterality: Left;   History reviewed. No pertinent family history. History  Substance Use Topics  . Smoking status: Current Every Day Smoker -- 0.25 packs/day  . Smokeless tobacco: Not on file  . Alcohol Use: Yes     Comment: occ    Review of Systems  Constitutional: Positive for fatigue. Negative for fever and chills.  HENT: Positive for sore throat.   Eyes: Negative for visual disturbance.  Respiratory: Positive for cough and chest tightness. Negative for shortness of breath.   Cardiovascular: Negative for chest pain, palpitations and leg swelling.  Gastrointestinal: Negative for nausea, vomiting, abdominal pain, diarrhea and constipation.  Genitourinary: Negative for dysuria, urgency, frequency and hematuria.  Musculoskeletal: Negative for arthralgias, myalgias, neck pain and neck stiffness.  Skin: Negative for rash.  Neurological: Negative for dizziness, weakness and light-headedness.    Allergies  Review of patient's allergies indicates no known allergies.  Home Medications   Current Outpatient Rx  Name  Route  Sig  Dispense  Refill  . albuterol (PROVENTIL HFA;VENTOLIN HFA) 108 (90 BASE) MCG/ACT  inhaler   Inhalation   Inhale 2 puffs into the lungs every 6 (six) hours as needed.         Marland Kitchen atorvastatin (LIPITOR) 40 MG tablet   Oral   Take 40 mg by mouth daily.         . benzonatate (TESSALON) 200 MG capsule   Oral   Take 1 capsule (200 mg total) by mouth 3 (three) times daily as needed for cough.   20 capsule   0   . chlorpheniramine-HYDROcodone (TUSSIONEX PENNKINETIC ER) 10-8 MG/5ML LQCR   Oral   Take 5 mLs by  mouth every 12 (twelve) hours as needed for cough.   115 mL   0   . HYDROcodone-acetaminophen (NORCO/VICODIN) 5-325 MG per tablet   Oral   Take 2 tablets by mouth every 4 (four) hours as needed for pain.   20 tablet   0   . lisinopril-hydrochlorothiazide (PRINZIDE,ZESTORETIC) 20-25 MG per tablet   Oral   Take 1 tablet by mouth daily.         Marland Kitchen oxyCODONE-acetaminophen (PERCOCET) 5-325 MG per tablet      1-2 tabs po q6 hours prn pain   40 tablet   0   . PredniSONE 10 MG KIT      12 day taper dose pack. Use as directed   48 each   0   . sitaGLIPtan-metformin (JANUMET) 50-1000 MG per tablet   Oral   Take 1 tablet by mouth 2 (two) times daily with a meal.         . vitamin C (ASCORBIC ACID) 500 MG tablet   Oral   Take 1 tablet (500 mg total) by mouth daily.   30 tablet   0   . zolpidem (AMBIEN) 10 MG tablet   Oral   Take 1 tablet (10 mg total) by mouth at bedtime as needed for sleep.   10 tablet   0    BP 125/81  Pulse 90  Temp(Src) 98.3 F (36.8 C) (Oral)  Resp 18  SpO2 95% Physical Exam  Nursing note and vitals reviewed. Constitutional: He is oriented to person, place, and time. He appears well-developed and well-nourished. No distress.  HENT:  Head: Normocephalic and atraumatic.  Right Ear: External ear normal.  Left Ear: External ear normal.  Nose: Nose normal.  Mouth/Throat: Oropharynx is clear and moist. No oropharyngeal exudate.  Eyes: Conjunctivae are normal. Pupils are equal, round, and reactive to light. Right eye exhibits no discharge. Left eye exhibits no discharge.  Neck: Normal range of motion. Neck supple.  Pulmonary/Chest: Effort normal. No respiratory distress. He has wheezes (mild, expiratory, in the bilateral apices). He has no rales.  Lymphadenopathy:    He has no cervical adenopathy.  Neurological: He is alert and oriented to person, place, and time. Coordination normal.  Skin: Skin is warm and dry. No rash noted. He is not  diaphoretic.  Psychiatric: He has a normal mood and affect. Judgment normal.    ED Course  Procedures (including critical care time) Labs Review Labs Reviewed - No data to display Imaging Review No results found.    MDM   1. URI (upper respiratory infection)   2. Asthma exacerbation, mild    Viral URI with mild exacerbation of his asthma, treating symptomatically. Will give a tapered dose pack corticosteroids as well. He will followup if any worsening.   Meds ordered this encounter  Medications  . PredniSONE 10 MG KIT    Sig:  12 day taper dose pack. Use as directed    Dispense:  48 each    Refill:  0    Order Specific Question:  Supervising Provider    Answer:  Lorenz Coaster, DAVID C [id number]  . chlorpheniramine-HYDROcodone (TUSSIONEX PENNKINETIC ER) 10-8 MG/5ML LQCR    Sig: Take 5 mLs by mouth every 12 (twelve) hours as needed for cough.    Dispense:  115 mL    Refill:  0    Order Specific Question:  Supervising Provider    Answer:  Lorenz Coaster, DAVID C [id number]  . benzonatate (TESSALON) 200 MG capsule    Sig: Take 1 capsule (200 mg total) by mouth 3 (three) times daily as needed for cough.    Dispense:  20 capsule    Refill:  0    Order Specific Question:  Supervising Provider    Answer:  Lorenz Coaster, DAVID C [[id number]]       Graylon Good, PA-C 12/17/12 1112

## 2012-12-22 ENCOUNTER — Encounter (HOSPITAL_COMMUNITY): Payer: Self-pay | Admitting: Pharmacy Technician

## 2012-12-23 ENCOUNTER — Other Ambulatory Visit (HOSPITAL_COMMUNITY): Payer: Self-pay | Admitting: Neurosurgery

## 2012-12-23 ENCOUNTER — Ambulatory Visit (HOSPITAL_COMMUNITY)
Admission: RE | Admit: 2012-12-23 | Discharge: 2012-12-23 | Disposition: A | Discharge status: Home / Self Care | Payer: Medicare Other | Source: Ambulatory Visit | Attending: Neurosurgery | Admitting: Neurosurgery

## 2012-12-23 DIAGNOSIS — M5144 Schmorl's nodes, thoracic region: Secondary | ICD-10-CM | POA: Insufficient documentation

## 2012-12-23 DIAGNOSIS — M5380 Other specified dorsopathies, site unspecified: Secondary | ICD-10-CM | POA: Insufficient documentation

## 2012-12-23 DIAGNOSIS — M47817 Spondylosis without myelopathy or radiculopathy, lumbosacral region: Secondary | ICD-10-CM

## 2012-12-23 DIAGNOSIS — M5137 Other intervertebral disc degeneration, lumbosacral region: Secondary | ICD-10-CM | POA: Diagnosis not present

## 2012-12-23 DIAGNOSIS — M4000 Postural kyphosis, site unspecified: Secondary | ICD-10-CM

## 2012-12-23 DIAGNOSIS — M4716 Other spondylosis with myelopathy, lumbar region: Secondary | ICD-10-CM | POA: Insufficient documentation

## 2012-12-23 DIAGNOSIS — M51379 Other intervertebral disc degeneration, lumbosacral region without mention of lumbar back pain or lower extremity pain: Secondary | ICD-10-CM | POA: Insufficient documentation

## 2012-12-23 DIAGNOSIS — M546 Pain in thoracic spine: Secondary | ICD-10-CM | POA: Diagnosis not present

## 2012-12-23 LAB — GLUCOSE, CAPILLARY
Glucose-Capillary: 113 mg/dL — ABNORMAL HIGH (ref 70–99)
Glucose-Capillary: 196 mg/dL — ABNORMAL HIGH (ref 70–99)

## 2012-12-23 MED ORDER — DIAZEPAM 5 MG PO TABS
10.0000 mg | ORAL_TABLET | Freq: Once | ORAL | Status: AC
  Administered 2012-12-23: 10 mg via ORAL
  Filled 2012-12-23: qty 2

## 2012-12-23 MED ORDER — HYDROCODONE-ACETAMINOPHEN 10-325 MG PO TABS
1.0000 | ORAL_TABLET | ORAL | Status: DC | PRN
  Filled 2012-12-23: qty 2

## 2012-12-23 MED ORDER — ONDANSETRON HCL 4 MG/2ML IJ SOLN: 4.0000 mg | Freq: Four times a day (QID) | INTRAMUSCULAR | Status: DC | PRN

## 2012-12-23 MED ORDER — HYDROCODONE-ACETAMINOPHEN 10-325 MG PO TABS
2.0000 | ORAL_TABLET | ORAL | Status: AC
  Administered 2012-12-23: 2 via ORAL
  Filled 2012-12-23: qty 2

## 2012-12-23 MED ORDER — IOHEXOL 300 MG/ML  SOLN
10.0000 mL | Freq: Once | INTRAMUSCULAR | Status: AC | PRN
  Administered 2012-12-23: 10 mL via INTRATHECAL

## 2012-12-23 NOTE — Progress Notes (Signed)
DR STERN NOTIFIED OF C/O PAIN; CLIENT STATES BACK PAIN DECREASED NOW TO 7/10 AFTER PAIN MED IN RADIOLOGY; PER DR STERN CLIENT MAY COME BY OFFICE FOR PAIN MEDICATION PRESCRIPTION AND PER DR STERN OK FOR CLIENT TO LEAVE AT 1045AM

## 2012-12-23 NOTE — Procedures (Signed)
Lumbar puncture L 34 with omnipaque 300.  Patient tolerated procedure well.

## 2013-02-16 ENCOUNTER — Ambulatory Visit (INDEPENDENT_AMBULATORY_CARE_PROVIDER_SITE_OTHER): Payer: Medicare Other | Admitting: Internal Medicine

## 2013-02-16 ENCOUNTER — Encounter: Payer: Self-pay | Admitting: Internal Medicine

## 2013-02-16 VITALS — BP 112/72 | HR 100 | Ht 72.0 in | Wt 331.0 lb

## 2013-02-16 DIAGNOSIS — Z720 Tobacco use: Secondary | ICD-10-CM

## 2013-02-16 DIAGNOSIS — G4733 Obstructive sleep apnea (adult) (pediatric): Secondary | ICD-10-CM

## 2013-02-16 DIAGNOSIS — F17200 Nicotine dependence, unspecified, uncomplicated: Secondary | ICD-10-CM

## 2013-02-16 DIAGNOSIS — J4540 Moderate persistent asthma, uncomplicated: Secondary | ICD-10-CM

## 2013-02-16 DIAGNOSIS — J45909 Unspecified asthma, uncomplicated: Secondary | ICD-10-CM

## 2013-02-16 MED ORDER — METHYLPREDNISOLONE ACETATE 80 MG/ML IJ SUSP
80.0000 mg | Freq: Once | INTRAMUSCULAR | Status: AC
  Administered 2013-02-16: 80 mg via INTRAMUSCULAR

## 2013-02-16 MED ORDER — FLUTICASONE FUROATE-VILANTEROL 100-25 MCG/INH IN AEPB: 1.0000 | INHALATION_SPRAY | Freq: Every day | RESPIRATORY_TRACT | Status: DC

## 2013-02-16 MED ORDER — LEVALBUTEROL HCL 0.63 MG/3ML IN NEBU
0.6300 mg | INHALATION_SOLUTION | Freq: Once | RESPIRATORY_TRACT | Status: AC
  Administered 2013-02-16: 0.63 mg via RESPIRATORY_TRACT

## 2013-02-16 NOTE — Patient Instructions (Signed)
Order- [facility] (need to identify his company) need to autotitrate his CPAP 5-20 cwp x 7 days for pressure recommendation. Ok to replace mask of choice, humidifier, supplies as needed. Dx OSA  We need to try to get [facility] Dr Poor, [facility]  Neb xop 0.63  Depo 80  Sample Breo Ellipta 1 puff, then rinse mouth, one time every day- maintenance inhaler  Need to stop smoking- this is important   Schedule PFT dx asthma

## 2013-02-16 NOTE — Progress Notes (Signed)
02/16/13- [age] yoM smoker referred courtesy of Dr Windle GuardWilson Elkins-  c/o snores alot,does not sleep well,has had CPAP 2005 off for 5 yrs. after moving,back on x 1 yr. Now, pressure. not good- too weak NPSG done 10 years ago- searching paper chart. 09/23/11- Dr Marvetta Gibbons [name]- Surgery- UPPP/ turbinate reduction/ septoplasty. Has gained a great deal of weight since original sleep study. Bedtime 1 AM, sleep latency 60-90 minutes, awakening 3 or 4 times during the night before up at 7:30 AM. Thinks current CPAP pressure may be around 8. Asthma treated with albuterol nebulizer. Continues to smoke one quarter pack per day. Now has an incidental chest cold with some wheeze for the past week. Hypertension without cardiac disease.  Prior to Admission medications   Medication Sig Start Date End Date Taking? Authorizing Provider  albuterol (PROVENTIL HFA;VENTOLIN HFA) 108 (90 BASE) MCG/ACT inhaler Inhale 2 puffs into the lungs every 6 (six) hours as needed for wheezing or shortness of breath.    Yes Historical Provider, MD  atorvastatin (LIPITOR) 40 MG tablet Take 40 mg by mouth daily.   Yes Historical Provider, MD  lisinopril-hydrochlorothiazide (PRINZIDE,ZESTORETIC) 20-25 MG per tablet Take 1 tablet by mouth daily.   Yes Historical Provider, MD  sitaGLIPtan-metformin (JANUMET) 50-1000 MG per tablet Take 1 tablet by mouth 2 (two) times daily with a meal.   Yes Historical Provider, MD  benzonatate (TESSALON) 200 MG capsule Take 1 capsule (200 mg total) by mouth 3 (three) times daily as needed for cough. 12/17/12   Adrian BlackwaterZachary H Baker, PA-C  Fluticasone Furoate-Vilanterol (BREO ELLIPTA) 100-25 MCG/INH AEPB Inhale 1 puff into the lungs daily. 02/16/13   Waymon Budgelinton D Hristopher Missildine, MD   Past Medical History  Diagnosis Date  . Hypertension   . Diabetes mellitus   . Asthma   . Obesity   . Sleep apnea     CPAP-has had UPPP-tonsils and turb red   Past Surgical History  Procedure Laterality Date  . Heel spurs      bilateral feel  .  Spinal cord stimulator implant  1997  . Back surgery  2009  . Nasal turbinate reduction  8/13    septoplasty-UPPP-tonsils  . Ganglion cyst excision  2/14-[facility]    lt wrist   . Tonsillectomy    . Dorsal compartment release Left 08/13/2012    Procedure: RELEASE DORSAL COMPARTMENT (DEQUERVAIN) and exploration of radial nerve branch;  Surgeon: Tami RibasKevin R Kuzma, MD;  Location: [facility];  Service: Orthopedics;  Laterality: Left;  . Ganglion cyst excision Left 08/13/2012    Procedure: REMOVAL GANGLION OF WRIST;  Surgeon: Tami RibasKevin R Kuzma, MD;  Location: [facility];  Service: Orthopedics;  Laterality: Left;   History reviewed. No pertinent family history. History   Social History  . Marital Status: Divorced    Spouse Name: N/A    Number of Children: N/A  . Years of Education: N/A   Occupational History  . Disabled    Social History Main Topics  . Smoking status: Current Every Day Smoker -- 0.25 packs/day  . Smokeless tobacco: Not on file  . Alcohol Use: Yes     Comment: occ  . Drug Use: No  . Sexual Activity: Not on file   Other Topics Concern  . Not on file   Social History Narrative   Lives alone.    Disabled   ROS-see HPI Constitutional:   No-   weight loss, night sweats, fevers, chills, +fatigue, lassitude. HEENT:   No-  headaches,  difficulty swallowing, tooth/dental problems, sore throat,       No-  sneezing, itching, ear ache, nasal congestion, post nasal drip,  CV:  No-   chest pain, orthopnea, PND, swelling in lower extremities, anasarca,                                  dizziness, palpitations Resp: No-   shortness of breath with exertion or at rest.              No-   productive cough,  No non-productive cough,  No- coughing up of blood.              No-   change in color of mucus.  + wheezing.   Skin: No-   rash or lesions. GI:  No-   heartburn, indigestion, abdominal pain, nausea, vomiting, diarrhea,                 change in bowel habits,  loss of appetite GU: No-   dysuria, change in color of urine, no urgency or frequency.  No- flank pain. MS:  No-   joint pain or swelling.    + back pain. Neuro-     nothing unusual Psych:  No- change in mood or affect. No depression or anxiety.  No memory loss.  OBJ- Physical Exam General- Alert, Oriented, Affect-appropriate, Distress- none acute, +obese Skin- rash-none, lesions- none, excoriation- none Lymphadenopathy- none Head- atraumatic            Eyes- Gross vision intact, PERRLA, conjunctivae and secretions clear            Ears- Hearing, canals-normal            Nose- Clear, no-Septal dev, mucus, polyps, erosion, perforation             Throat- Mallampati II/ UPPP , mucosa clear , drainage- none, tonsils- atrophic Neck- flexible , trachea midline, no stridor , thyroid nl, carotid no bruit Chest - symmetrical excursion , unlabored           Heart/CV- RRR , no murmur , no gallop  , no rub, nl s1 s2                           - JVD- none , edema- none, stasis changes- none, varices- none           Lung- , wheeze+Bilateral, cough- none , dullness-none, rub- none           Chest wall-  Abd- tender-no, distended-no, bowel sounds-present, HSM- no Br/ Gen/ Rectal- Not done, not indicated Extrem- cyanosis- none, clubbing, none, atrophy- none, strength- nl Neuro- grossly intact to observation

## 2013-02-23 ENCOUNTER — Telehealth: Payer: Self-pay | Admitting: Internal Medicine

## 2013-02-23 NOTE — Telephone Encounter (Signed)
lmomtcb x1 for pt  I have re faxed orders over to [facility]

## 2013-02-24 NOTE — Telephone Encounter (Signed)
Pt advised. Christian Bowen, CMA  

## 2013-03-13 ENCOUNTER — Encounter: Payer: Self-pay | Admitting: Internal Medicine

## 2013-03-13 DIAGNOSIS — Z720 Tobacco use: Secondary | ICD-10-CM

## 2013-03-13 DIAGNOSIS — G4733 Obstructive sleep apnea (adult) (pediatric): Secondary | ICD-10-CM | POA: Insufficient documentation

## 2013-03-13 DIAGNOSIS — J4540 Moderate persistent asthma, uncomplicated: Secondary | ICD-10-CM | POA: Insufficient documentation

## 2013-03-13 HISTORY — DX: Tobacco use: Z72.0

## 2013-03-13 NOTE — Assessment & Plan Note (Signed)
He is gained a lot of weight since his original sleep study, without pressure adjustment of his CPAP. Plan-establish DME company an auto titrate for pressure evaluation

## 2013-03-13 NOTE — Assessment & Plan Note (Signed)
counseling 

## 2013-03-13 NOTE — Assessment & Plan Note (Signed)
He is a smoker with wheeze so this is probably going to be COPD with asthma. Plan-smoking cessation counseling, sample Breo Ellipta, schedule PFT, nebulizer Xopenex, Depo-Medrol

## 2013-03-13 NOTE — Assessment & Plan Note (Signed)
He indicates weight gain and is much is 100 pounds over the past 10 years. Weight loss advised.

## 2013-03-24 ENCOUNTER — Other Ambulatory Visit: Payer: Self-pay | Admitting: Anesthesiology

## 2013-03-24 ENCOUNTER — Encounter (HOSPITAL_COMMUNITY): Payer: Self-pay | Admitting: Pharmacy Technician

## 2013-03-25 NOTE — Pre-Procedure Instructions (Signed)
Christian Bowen  03/25/2013   Your procedure is scheduled on: Feb. 27th,  Friday   [facility]  [facility] 3 at  8:00 AM.  Call this number if you have problems the morning of surgery: [phone]   Remember:   Do not eat food or drink liquids after midnight Thursday.   Take these medicines the morning of surgery - Inhalers   Do not wear jewelry.  Do not wear lotions, powders, or colognes. You may NOT wear deodorant.   Men may shave face and neck.  Do not bring valuables to the hospital.  [facility] any belongings or valuables.               Contacts, dentures or bridgework may not be worn into surgery.  Leave suitcase in the car. After surgery it may be brought to your room.  For patients admitted to the hospital, discharge time is determined by your treatment team.              Name and phone number of your driver:                      Special Instructions: **Please read --Preparing for Surgery   Please read over the following fact sheets that you were given: Pain Booklet and Surgical Site Infection Prevention

## 2013-03-26 ENCOUNTER — Encounter (INDEPENDENT_AMBULATORY_CARE_PROVIDER_SITE_OTHER): Payer: Self-pay

## 2013-03-26 ENCOUNTER — Encounter (HOSPITAL_COMMUNITY)
Admission: RE | Admit: 2013-03-26 | Discharge: 2013-03-26 | Disposition: A | Discharge status: Home / Self Care | Payer: Medicare Other | Source: Ambulatory Visit | Attending: Anesthesiology | Admitting: Anesthesiology

## 2013-03-26 ENCOUNTER — Encounter (HOSPITAL_COMMUNITY): Payer: Self-pay

## 2013-03-26 DIAGNOSIS — Z01818 Encounter for other preprocedural examination: Secondary | ICD-10-CM | POA: Insufficient documentation

## 2013-03-26 DIAGNOSIS — Z01812 Encounter for preprocedural laboratory examination: Secondary | ICD-10-CM | POA: Insufficient documentation

## 2013-03-26 DIAGNOSIS — Z01810 Encounter for preprocedural cardiovascular examination: Secondary | ICD-10-CM | POA: Insufficient documentation

## 2013-03-26 HISTORY — DX: Sleep apnea, unspecified: G47.30

## 2013-03-26 LAB — CBC
HCT: 45 % (ref 39.0–52.0)
Hemoglobin: 14.8 g/dL (ref 13.0–17.0)
MCH: 31.1 pg (ref 26.0–34.0)
MCHC: 32.9 g/dL (ref 30.0–36.0)
MCV: 94.5 fL (ref 78.0–100.0)
PLATELETS: 156 10*3/uL (ref 150–400)
RBC: 4.76 MIL/uL (ref 4.22–5.81)
RDW: 13.7 % (ref 11.5–15.5)
WBC: 10.2 10*3/uL (ref 4.0–10.5)

## 2013-03-26 LAB — BASIC METABOLIC PANEL
BUN: 17 mg/dL (ref 6–23)
CHLORIDE: 103 meq/L (ref 96–112)
CO2: 28 meq/L (ref 19–32)
Calcium: 9.8 mg/dL (ref 8.4–10.5)
Creatinine, Ser: 0.98 mg/dL (ref 0.50–1.35)
GFR calc non Af Amer: >90 mL/min (ref >90)
Glucose, Bld: 105 mg/dL — ABNORMAL HIGH (ref 70–99)
Potassium: 5.1 mEq/L (ref 3.7–5.3)
Sodium: 142 mEq/L (ref 137–147)

## 2013-03-26 LAB — SURGICAL PCR SCREEN
MRSA, PCR: NEGATIVE
Staphylococcus aureus: NEGATIVE

## 2013-03-26 NOTE — Progress Notes (Addendum)
I had called Dr. Ollen BowlHarkins' office yesterday (Thursday), requesting that  Orders be released.  None showing at present.  DA I had sent an A-11 to material management for thigh hi teds-- calf diameter is 19.5 inches.  26-27 length.  Materials has sent me Size L  15-17.5 diameter.  Will let office know.  DA

## 2013-03-30 ENCOUNTER — Ambulatory Visit: Payer: Medicare Other | Admitting: Internal Medicine

## 2013-04-01 MED ORDER — DEXTROSE 5 % IV SOLN
3.0000 g | INTRAVENOUS | Status: AC
  Administered 2013-04-02: 3 g via INTRAVENOUS
  Filled 2013-04-01: qty 3000

## 2013-04-02 ENCOUNTER — Encounter (HOSPITAL_COMMUNITY): Payer: Self-pay | Admitting: *Deleted

## 2013-04-02 ENCOUNTER — Ambulatory Visit (HOSPITAL_COMMUNITY): Payer: Medicare Other | Admitting: Certified Registered Nurse Anesthetist

## 2013-04-02 ENCOUNTER — Ambulatory Visit (HOSPITAL_COMMUNITY): Payer: Medicare Other

## 2013-04-02 ENCOUNTER — Encounter (HOSPITAL_COMMUNITY)
Admission: RE | Disposition: A | Discharge status: Home / Self Care | Payer: Self-pay | Source: Ambulatory Visit | Attending: Anesthesiology

## 2013-04-02 ENCOUNTER — Observation Stay (HOSPITAL_COMMUNITY)
Admission: RE | Admit: 2013-04-02 | Discharge: 2013-04-03 | Disposition: A | Discharge status: Home / Self Care | Payer: Medicare Other | Source: Ambulatory Visit | Attending: Anesthesiology | Admitting: Anesthesiology

## 2013-04-02 ENCOUNTER — Encounter (HOSPITAL_COMMUNITY): Payer: Medicare Other | Admitting: Certified Registered Nurse Anesthetist

## 2013-04-02 DIAGNOSIS — I10 Essential (primary) hypertension: Secondary | ICD-10-CM | POA: Insufficient documentation

## 2013-04-02 DIAGNOSIS — G4730 Sleep apnea, unspecified: Secondary | ICD-10-CM | POA: Insufficient documentation

## 2013-04-02 DIAGNOSIS — E669 Obesity, unspecified: Secondary | ICD-10-CM | POA: Insufficient documentation

## 2013-04-02 DIAGNOSIS — F17200 Nicotine dependence, unspecified, uncomplicated: Secondary | ICD-10-CM | POA: Insufficient documentation

## 2013-04-02 DIAGNOSIS — J45909 Unspecified asthma, uncomplicated: Secondary | ICD-10-CM | POA: Insufficient documentation

## 2013-04-02 DIAGNOSIS — M961 Postlaminectomy syndrome, not elsewhere classified: Secondary | ICD-10-CM

## 2013-04-02 DIAGNOSIS — IMO0002 Reserved for concepts with insufficient information to code with codable children: Secondary | ICD-10-CM | POA: Insufficient documentation

## 2013-04-02 DIAGNOSIS — Y849 Medical procedure, unspecified as the cause of abnormal reaction of the patient, or of later complication, without mention of misadventure at the time of the procedure: Secondary | ICD-10-CM | POA: Insufficient documentation

## 2013-04-02 DIAGNOSIS — F1210 Cannabis abuse, uncomplicated: Secondary | ICD-10-CM | POA: Insufficient documentation

## 2013-04-02 DIAGNOSIS — E119 Type 2 diabetes mellitus without complications: Secondary | ICD-10-CM | POA: Insufficient documentation

## 2013-04-02 HISTORY — DX: Postlaminectomy syndrome, not elsewhere classified: M96.1

## 2013-04-02 HISTORY — PX: SPINAL CORD STIMULATOR INSERTION: SHX5378

## 2013-04-02 LAB — GLUCOSE, CAPILLARY
Glucose-Capillary: 152 mg/dL — ABNORMAL HIGH (ref 70–99)
Glucose-Capillary: 149 mg/dL — ABNORMAL HIGH (ref 70–99)

## 2013-04-02 SURGERY — INSERTION, SPINAL CORD STIMULATOR, LUMBAR
Anesthesia: Monitor Anesthesia Care | Site: Back

## 2013-04-02 MED ORDER — PROPOFOL 10 MG/ML IV BOLUS
INTRAVENOUS | Status: DC | PRN
  Administered 2013-04-02: 50 mg via INTRAVENOUS

## 2013-04-02 MED ORDER — METHYLPREDNISOLONE SODIUM SUCC 125 MG IJ SOLR
INTRAMUSCULAR | Status: AC
  Filled 2013-04-02: qty 2

## 2013-04-02 MED ORDER — ACETAMINOPHEN 325 MG PO TABS: 650.0000 mg | ORAL_TABLET | ORAL | Status: DC | PRN

## 2013-04-02 MED ORDER — LACTATED RINGERS IV SOLN
INTRAVENOUS | Status: DC
  Administered 2013-04-02: 08:00:00 via INTRAVENOUS
  Administered 2013-04-02: 20 mL/h via INTRAVENOUS

## 2013-04-02 MED ORDER — FENTANYL CITRATE 0.05 MG/ML IJ SOLN
INTRAMUSCULAR | Status: DC | PRN
  Administered 2013-04-02: 50 ug via INTRAVENOUS
  Administered 2013-04-02 (×2): 25 ug via INTRAVENOUS
  Administered 2013-04-02: 100 ug via INTRAVENOUS
  Administered 2013-04-02: 50 ug via INTRAVENOUS

## 2013-04-02 MED ORDER — SODIUM CHLORIDE 0.9 % IJ SOLN: 3.0000 mL | Freq: Two times a day (BID) | INTRAMUSCULAR | Status: DC

## 2013-04-02 MED ORDER — ACETAMINOPHEN 650 MG RE SUPP: 650.0000 mg | RECTAL | Status: DC | PRN

## 2013-04-02 MED ORDER — DIPHENHYDRAMINE HCL 50 MG/ML IJ SOLN
INTRAMUSCULAR | Status: AC
  Filled 2013-04-02: qty 1

## 2013-04-02 MED ORDER — HYDROMORPHONE HCL PF 1 MG/ML IJ SOLN
INTRAMUSCULAR | Status: AC
  Filled 2013-04-02: qty 1

## 2013-04-02 MED ORDER — PHENOL 1.4 % MT LIQD: 1.0000 | OROMUCOSAL | Status: DC | PRN

## 2013-04-02 MED ORDER — MENTHOL 3 MG MT LOZG: 1.0000 | LOZENGE | OROMUCOSAL | Status: DC | PRN

## 2013-04-02 MED ORDER — OXYCODONE HCL 5 MG PO TABS: 5.0000 mg | ORAL_TABLET | Freq: Once | ORAL | Status: DC | PRN

## 2013-04-02 MED ORDER — MIDAZOLAM HCL 5 MG/5ML IJ SOLN
INTRAMUSCULAR | Status: DC | PRN
  Administered 2013-04-02 (×2): 2 mg via INTRAVENOUS

## 2013-04-02 MED ORDER — PROPOFOL 10 MG/ML IV BOLUS
INTRAVENOUS | Status: AC
  Filled 2013-04-02: qty 20

## 2013-04-02 MED ORDER — CEPHALEXIN 500 MG PO CAPS: 500.0000 mg | ORAL_CAPSULE | Freq: Four times a day (QID) | ORAL | Status: DC

## 2013-04-02 MED ORDER — BACITRACIN ZINC 500 UNIT/GM EX OINT
TOPICAL_OINTMENT | CUTANEOUS | Status: DC | PRN
  Administered 2013-04-02: 1 via TOPICAL

## 2013-04-02 MED ORDER — ALUM & MAG HYDROXIDE-SIMETH 200-200-20 MG/5ML PO SUSP: 30.0000 mL | Freq: Four times a day (QID) | ORAL | Status: DC | PRN

## 2013-04-02 MED ORDER — OXYCODONE HCL 5 MG/5ML PO SOLN: 5.0000 mg | Freq: Once | ORAL | Status: DC | PRN

## 2013-04-02 MED ORDER — METHYLPREDNISOLONE SODIUM SUCC 125 MG IJ SOLR
125.0000 mg | Freq: Once | INTRAMUSCULAR | Status: AC
  Administered 2013-04-02: 125 mg via INTRAVENOUS

## 2013-04-02 MED ORDER — SODIUM CHLORIDE 0.9 % IV SOLN: 250.0000 mL | INTRAVENOUS | Status: DC

## 2013-04-02 MED ORDER — HYDROMORPHONE HCL PF 1 MG/ML IJ SOLN: 0.2500 mg | INTRAMUSCULAR | Status: DC | PRN

## 2013-04-02 MED ORDER — OXYCODONE HCL 5 MG PO TABS
ORAL_TABLET | ORAL | Status: AC
  Filled 2013-04-02: qty 1

## 2013-04-02 MED ORDER — ONDANSETRON HCL 4 MG/2ML IJ SOLN: 4.0000 mg | Freq: Once | INTRAMUSCULAR | Status: DC | PRN

## 2013-04-02 MED ORDER — LACTATED RINGERS IV SOLN
INTRAVENOUS | Status: DC | PRN
  Administered 2013-04-02 (×2): via INTRAVENOUS

## 2013-04-02 MED ORDER — SODIUM CHLORIDE 0.9 % IR SOLN
Status: DC | PRN
  Administered 2013-04-02: 10:00:00

## 2013-04-02 MED ORDER — ALBUTEROL SULFATE (2.5 MG/3ML) 0.083% IN NEBU: 2.5000 mg | INHALATION_SOLUTION | RESPIRATORY_TRACT | Status: DC | PRN

## 2013-04-02 MED ORDER — DIPHENHYDRAMINE HCL 50 MG/ML IJ SOLN
12.5000 mg | Freq: Once | INTRAMUSCULAR | Status: AC
  Administered 2013-04-02: 12.5 mg via INTRAVENOUS

## 2013-04-02 MED ORDER — FENTANYL CITRATE 0.05 MG/ML IJ SOLN
INTRAMUSCULAR | Status: AC
  Filled 2013-04-02: qty 5

## 2013-04-02 MED ORDER — BUPIVACAINE-EPINEPHRINE (PF) 0.5% -1:200000 IJ SOLN
INTRAMUSCULAR | Status: DC | PRN
  Administered 2013-04-02: 34 mL

## 2013-04-02 MED ORDER — 0.9 % SODIUM CHLORIDE (POUR BTL) OPTIME
TOPICAL | Status: DC | PRN
  Administered 2013-04-02: 1000 mL

## 2013-04-02 MED ORDER — SODIUM CHLORIDE 0.9 % IJ SOLN: 3.0000 mL | INTRAMUSCULAR | Status: DC | PRN

## 2013-04-02 MED ORDER — HYDROMORPHONE HCL PF 1 MG/ML IJ SOLN
0.2500 mg | INTRAMUSCULAR | Status: DC | PRN
  Administered 2013-04-02: 0.5 mg via INTRAVENOUS

## 2013-04-02 MED ORDER — MEPERIDINE HCL 25 MG/ML IJ SOLN: 6.2500 mg | INTRAMUSCULAR | Status: DC | PRN

## 2013-04-02 MED ORDER — MIDAZOLAM HCL 2 MG/2ML IJ SOLN
INTRAMUSCULAR | Status: AC
  Filled 2013-04-02: qty 2

## 2013-04-02 MED ORDER — LIDOCAINE HCL (CARDIAC) 20 MG/ML IV SOLN
INTRAVENOUS | Status: AC
  Filled 2013-04-02: qty 5

## 2013-04-02 MED ORDER — OXYCODONE-ACETAMINOPHEN 5-325 MG PO TABS
1.0000 | ORAL_TABLET | ORAL | Status: DC | PRN
  Administered 2013-04-02 – 2013-04-03 (×4): 2 via ORAL
  Filled 2013-04-02 (×4): qty 2

## 2013-04-02 MED ORDER — OXYCODONE-ACETAMINOPHEN 10-325 MG PO TABS: 1.0000 | ORAL_TABLET | ORAL | Status: DC | PRN

## 2013-04-02 MED ORDER — ONDANSETRON HCL 4 MG/2ML IJ SOLN: 4.0000 mg | INTRAMUSCULAR | Status: DC | PRN

## 2013-04-02 MED ORDER — CEFAZOLIN SODIUM 1-5 GM-% IV SOLN
1.0000 g | Freq: Three times a day (TID) | INTRAVENOUS | Status: AC
  Administered 2013-04-02 – 2013-04-03 (×2): 1 g via INTRAVENOUS
  Filled 2013-04-02 (×2): qty 50

## 2013-04-02 MED ORDER — PROPOFOL INFUSION 10 MG/ML OPTIME
INTRAVENOUS | Status: DC | PRN
  Administered 2013-04-02: 100 ug/kg/min via INTRAVENOUS

## 2013-04-02 MED ORDER — ALBUTEROL SULFATE (2.5 MG/3ML) 0.083% IN NEBU
INHALATION_SOLUTION | RESPIRATORY_TRACT | Status: AC
  Filled 2013-04-02: qty 3

## 2013-04-02 SURGICAL SUPPLY — 67 items
BAG DECANTER FOR FLEXI CONT (MISCELLANEOUS) ×2 IMPLANT
BENZOIN TINCTURE PRP APPL 2/3 (GAUZE/BANDAGES/DRESSINGS) IMPLANT
BINDER ABD UNIV 12 45-62 (WOUND CARE) ×1 IMPLANT
BINDER ABDOMINAL 46IN 62IN (WOUND CARE) ×2
BLADE SURG ROTATE 9660 (MISCELLANEOUS) IMPLANT
CHLORAPREP W/TINT 26ML (MISCELLANEOUS) ×2 IMPLANT
CONT SPEC 4OZ CLIKSEAL STRL BL (MISCELLANEOUS) ×2 IMPLANT
DERMABOND ADHESIVE PROPEN (GAUZE/BANDAGES/DRESSINGS) ×1
DERMABOND ADVANCED (GAUZE/BANDAGES/DRESSINGS) ×1
DERMABOND ADVANCED .7 DNX12 (GAUZE/BANDAGES/DRESSINGS) ×1 IMPLANT
DERMABOND ADVANCED .7 DNX6 (GAUZE/BANDAGES/DRESSINGS) ×1 IMPLANT
DRAPE C-ARM 42X72 X-RAY (DRAPES) ×2 IMPLANT
DRAPE C-ARMOR (DRAPES) ×2 IMPLANT
DRAPE CAMERA VIDEO/LASER (DRAPES) ×2 IMPLANT
DRAPE LAPAROTOMY 100X72X124 (DRAPES) ×2 IMPLANT
DRAPE POUCH INSTRU U-SHP 10X18 (DRAPES) ×2 IMPLANT
DRAPE SURG 17X23 STRL (DRAPES) ×2 IMPLANT
DRESSING TELFA 8X3 (GAUZE/BANDAGES/DRESSINGS) IMPLANT
DRSG OPSITE POSTOP 3X4 (GAUZE/BANDAGES/DRESSINGS) IMPLANT
DRSG OPSITE POSTOP 4X6 (GAUZE/BANDAGES/DRESSINGS) ×2 IMPLANT
ELECT REM PT RETURN 9FT ADLT (ELECTROSURGICAL) ×2
ELECTRODE REM PT RTRN 9FT ADLT (ELECTROSURGICAL) ×1 IMPLANT
GAUZE SPONGE 4X4 16PLY XRAY LF (GAUZE/BANDAGES/DRESSINGS) ×2 IMPLANT
GLOVE ECLIPSE 7.5 STRL STRAW (GLOVE) ×8 IMPLANT
GLOVE EXAM NITRILE LRG STRL (GLOVE) IMPLANT
GLOVE EXAM NITRILE MD LF STRL (GLOVE) IMPLANT
GLOVE EXAM NITRILE XL STR (GLOVE) IMPLANT
GLOVE EXAM NITRILE XS STR PU (GLOVE) IMPLANT
GLOVE INDICATOR 7.5 STRL GRN (GLOVE) ×4 IMPLANT
GOWN BRE IMP SLV AUR LG STRL (GOWN DISPOSABLE) IMPLANT
GOWN BRE IMP SLV AUR XL STRL (GOWN DISPOSABLE) IMPLANT
GOWN SPEC L3 XXLG W/TWL (GOWN DISPOSABLE) ×2 IMPLANT
GOWN STRL REIN 2XL LVL4 (GOWN DISPOSABLE) IMPLANT
GOWN STRL REUS W/ TWL LRG LVL3 (GOWN DISPOSABLE) ×1 IMPLANT
GOWN STRL REUS W/TWL LRG LVL3 (GOWN DISPOSABLE) ×1
KIT ACCESORY (Kit) ×2 IMPLANT
KIT ACCESSORY (KITS) ×2 IMPLANT
KIT ACCESSORY STYLER (KITS) ×2 IMPLANT
KIT BASIN OR (CUSTOM PROCEDURE TRAY) ×2 IMPLANT
KIT LEAD (Lead) ×2 IMPLANT
KIT NEUROSTIMULATOR ACCESSORY (KITS) ×2 IMPLANT
KIT ROOM TURNOVER OR (KITS) ×2 IMPLANT
NEEDLE 18GX1X1/2 (RX/OR ONLY) (NEEDLE) IMPLANT
NEEDLE HYPO 25X1 1.5 SAFETY (NEEDLE) ×2 IMPLANT
NS IRRIG 1000ML POUR BTL (IV SOLUTION) ×2 IMPLANT
PACK LAMINECTOMY NEURO (CUSTOM PROCEDURE TRAY) ×2 IMPLANT
PAD ARMBOARD 7.5X6 YLW CONV (MISCELLANEOUS) ×2 IMPLANT
PERCUTANEOUS LEAD INTRODUCER ACCESSORY KIT ×2 IMPLANT
PROGRAMMER PATIENT (SPINAL CORD STIMULATOR) ×2 IMPLANT
SPONGE LAP 4X18 X RAY DECT (DISPOSABLE) ×2 IMPLANT
SPONGE SURGIFOAM ABS GEL SZ50 (HEMOSTASIS) IMPLANT
STAPLER SKIN PROX WIDE 3.9 (STAPLE) ×2 IMPLANT
STIMULATOR SURESCAN SPINAL (Neurostimulator) ×2 IMPLANT
STRIP CLOSURE SKIN 1/2X4 (GAUZE/BANDAGES/DRESSINGS) IMPLANT
SUT MNCRL AB 4-0 PS2 18 (SUTURE) IMPLANT
SUT SILK 0 (SUTURE) ×1
SUT SILK 0 MO-6 18XCR BRD 8 (SUTURE) ×1 IMPLANT
SUT SILK 0 TIES 10X30 (SUTURE) IMPLANT
SUT SILK 2 0 TIES 10X30 (SUTURE) IMPLANT
SUT VIC AB 2-0 CP2 18 (SUTURE) ×6 IMPLANT
SYR EPIDURAL 5ML GLASS (SYRINGE) ×2 IMPLANT
SYRINGE 10CC LL (SYRINGE) IMPLANT
SYSTEM RECHARG SURESCAN SPINE (Neurostimulator) ×2 IMPLANT
TOWEL OR 17X24 6PK STRL BLUE (TOWEL DISPOSABLE) ×2 IMPLANT
TOWEL OR 17X26 10 PK STRL BLUE (TOWEL DISPOSABLE) ×2 IMPLANT
WATER STERILE IRR 1000ML POUR (IV SOLUTION) ×2 IMPLANT
YANKAUER SUCT BULB TIP NO VENT (SUCTIONS) ×2 IMPLANT

## 2013-04-02 NOTE — Transfer of Care (Signed)
Immediate Anesthesia Transfer of Care Note  Patient: Christian GreekLuther Bowen  Procedure(s) Performed: Procedure(s): LUMBAR SPINAL CORD STIMULATOR Insertion (N/A)  Patient Location: PACU  Anesthesia Type:General  Level of Consciousness: awake, alert  and oriented  Airway & Oxygen Therapy: Patient Spontanous Breathing and Patient connected to nasal cannula oxygen  Post-op Assessment: Report given to PACU RN, Post -op Vital signs reviewed and stable and Patient moving all extremities  Post vital signs: Reviewed and stable  Complications: No apparent anesthesia complications

## 2013-04-02 NOTE — H&P (Signed)
Christian Bowen is an 53 y.o. male.   Chief Complaint: back pain HPI: patient with previous lumbar surgery, SCS placed 15 years ago. Now with focal lumbago, non-surgical. SCS trial with additional lead successful with decrease of >50% of pain, increased activity  Past Medical History  Diagnosis Date  . Hypertension   . Diabetes mellitus   . Asthma   . Obesity   . Sleep apnea     CPAP-has had UPPP-tonsils and turb red  . Apnea, sleep     was tested 2005    Past Surgical History  Procedure Laterality Date  . Heel spurs      bilateral feel  . Spinal cord stimulator implant  1997  . Back surgery  2009  . Nasal turbinate reduction  8/13    septoplasty-UPPP-tonsils  . Ganglion cyst excision  2/14-[facility]    lt wrist   . Tonsillectomy    . Dorsal compartment release Left 08/13/2012    Procedure: RELEASE DORSAL COMPARTMENT (DEQUERVAIN) and exploration of radial nerve branch;  Surgeon: Tami RibasKevin R Kuzma, MD;  Location: [facility];  Service: Orthopedics;  Laterality: Left;  . Ganglion cyst excision Left 08/13/2012    Procedure: REMOVAL GANGLION OF WRIST;  Surgeon: Tami RibasKevin R Kuzma, MD;  Location: [facility];  Service: Orthopedics;  Laterality: Left;    History reviewed. No pertinent family history. Social History:  reports that he has been smoking.  He has never used smokeless tobacco. He reports that he drinks alcohol. He reports that he uses illicit drugs (Marijuana).  Allergies: No Known Allergies  Medications Prior to Admission  Medication Sig Dispense Refill  . albuterol (PROVENTIL HFA;VENTOLIN HFA) 108 (90 BASE) MCG/ACT inhaler Inhale 2 puffs into the lungs every 6 (six) hours as needed for wheezing or shortness of breath.       Marland Kitchen. atorvastatin (LIPITOR) 40 MG tablet Take 40 mg by mouth daily.      . Fluticasone Furoate-Vilanterol (BREO ELLIPTA) 100-25 MCG/INH AEPB Inhale 1 puff into the lungs daily.  1 each  0  . lisinopril-hydrochlorothiazide  (PRINZIDE,ZESTORETIC) 20-25 MG per tablet Take 1 tablet by mouth daily.      . sitaGLIPtan-metformin (JANUMET) 50-1000 MG per tablet Take 1 tablet by mouth 2 (two) times daily with a meal.        Results for orders placed during the hospital encounter of 04/02/13 (from the past 48 hour(s))  GLUCOSE, CAPILLARY     Status: Abnormal   Collection Time    04/02/13  8:08 AM      Result Value Ref Range   Glucose-Capillary 152 (*) 70 - 99 mg/dL   Comment 1 Documented in Chart     Comment 2 Notify RN     No results found.  Review of Systems  Constitutional: Negative.   HENT: Negative.   Eyes: Negative.   Respiratory: Negative.   Cardiovascular: Negative.   Gastrointestinal: Negative.   Genitourinary: Negative.   Musculoskeletal: Positive for back pain.  Skin: Negative.   Neurological: Negative.   Endo/Heme/Allergies: Negative.   Psychiatric/Behavioral: Negative.     Blood pressure 159/107, pulse 96, temperature 98.5 F (36.9 C), temperature source Oral, resp. rate 20, SpO2 97.00%. Physical Exam  Constitutional: He appears well-developed and well-nourished.  Eyes: EOM are normal. Pupils are equal, round, and reactive to light.  Neck: Normal range of motion. Neck supple.  Cardiovascular: Normal rate and regular rhythm.   Respiratory: Effort normal.  Skin: Skin is warm and dry.  Psychiatric: He has a normal mood and affect. His behavior is normal. Judgment and thought content normal.     Assessment/Plan Lumbar post-laminectomy syndrome, previous SCS only covers radicular pain, not lumbago. Excellent success with new SCS trial.  PLAN: implant new SCS lead, replace failing IPG  Jackee Glasner C 04/02/2013, 9:40 AM

## 2013-04-02 NOTE — Discharge Instructions (Signed)

## 2013-04-02 NOTE — Progress Notes (Signed)
PACU Note  Shortly after 0.5mg  Dilaudid administered, the patient complained of being unable to breath and feeling warm in the face. He was given 4 metered doses of an albuterol inhaler. His chest was quiet, without wheezing and I suspected he was having a full-blown asthma attack with poor air movement. We administered an albuterol breathing treatment and gave him 12.5mg  Benedryl and 125mg  Solumedrol IV, with improvement of his symptoms and his chest became noisy withy increased air movement.  His saturations were in the low 90s during the severe bronchospasm and have improved to the high 90s with him being symptomatically better.  It is likely we will admit him over night with a hospitalist consult unless he improves dramatically.  Arta BruceKevin Syncere Eble  MD

## 2013-04-02 NOTE — Anesthesia Preprocedure Evaluation (Signed)
Anesthesia Evaluation  Patient identified by MRN, date of birth, ID band Patient awake    Reviewed: Allergy & Precautions, H&P , NPO status , Patient's Chart, lab work & pertinent test results  Airway Mallampati: I TM Distance: >3 FB Neck ROM: Full    Dental   Pulmonary asthma , sleep apnea and Continuous Positive Airway Pressure Ventilation , Current Smoker,          Cardiovascular hypertension, Pt. on medications     Neuro/Psych    GI/Hepatic   Endo/Other  diabetes, Type 2, Oral Hypoglycemic Agents  Renal/GU      Musculoskeletal   Abdominal   Peds  Hematology   Anesthesia Other Findings   Reproductive/Obstetrics                           Anesthesia Physical Anesthesia Plan  ASA: II  Anesthesia Plan: MAC   Post-op Pain Management:    Induction: Intravenous  Airway Management Planned: Natural Airway  Additional Equipment:   Intra-op Plan:   Post-operative Plan:   Informed Consent: I have reviewed the patients History and Physical, chart, labs and discussed the procedure including the risks, benefits and alternatives for the proposed anesthesia with the patient or authorized representative who has indicated his/her understanding and acceptance.     Plan Discussed with: CRNA and Surgeon  Anesthesia Plan Comments:         Anesthesia Quick Evaluation

## 2013-04-02 NOTE — Anesthesia Procedure Notes (Signed)
Procedure Name: MAC Date/Time: 04/02/2013 10:20 AM Performed by: Orvilla FusATO, Rafaelita Foister A Pre-anesthesia Checklist: Patient identified, Timeout performed, Emergency Drugs available, Patient being monitored and Suction available Oxygen Delivery Method: Nasal cannula Placement Confirmation: positive ETCO2

## 2013-04-02 NOTE — Progress Notes (Signed)
Christian Bowen rolled out from OR on a strecher. Fully awake and oriented complaining of pain 09/10. No known allergies on file. Gave Dilaudid 0.5mg  IV as ordered for pain. Soon after pt starts to complain about itching around face and neck, and some tightness in breathing. Informed Dr.Ossey, O2 sat strats to drop gradually. Dr ordered for Albutrol nebu, given. Pt starts to feel better slowly, O2 sat came to high 90's. Also given Benadryil 12.5mg  and Solu medrolrol  125mg  IV. Pt is feeling much better. He is admitted to 4N for observation overnight. Danne HarborJohny,RN

## 2013-04-02 NOTE — Anesthesia Postprocedure Evaluation (Signed)
Anesthesia Post Note  Patient: Christian Bowen  Procedure(s) Performed: Procedure(s) (LRB): LUMBAR SPINAL CORD STIMULATOR Insertion (N/A)  Anesthesia type: general  Patient location: PACU  Post pain: Pain level controlled  Post assessment: Patient's Cardiovascular Status Stable  Last Vitals:  Filed Vitals:   04/02/13 1600  BP: 127/75  Pulse:   Temp:   Resp:     Post vital signs: Reviewed and stable  Level of consciousness: sedated  Complications: No apparent anesthesia complications. Acute asthmatic episode triggere3d by administration of 0.5mg  Dilaudid. See Dr Deirdre Priestssey's progress note.

## 2013-04-02 NOTE — Op Note (Signed)
PREOP DX: 1) lumbago  2) lumbar radiculopathy  3) lumbar post-laminectomy syndrome  4) chronic pain  POSTOP DX: 1) lumbago  2) lumbar radiculopathy  3) lumbar post-laminectomy syndrome  4) chronic pain  PROCEDURES PERFORMED:1) intraop fluoro 2) placement of 1 8 contact Medtronic wide space lead 3) replacement of Medtronic IPG with Serial # [id number] SURGEON:Sherrel Ploch  ASSISTANT: NONE  ANESTHESIA: MAC  EBL: <20cc  DESCRIPTION OF PROCEDURE: After a discussion of risks, benefits and alternatives, informed consent was obtained. The patient was taken to the OR, turned prone onto a Jackson table, all pressure points padded, SCD's placed, and an adequate plane of anesthesia induced. A timeout was taken to verify the correct patient, position, personnel, availability of appropriate equipment, and administration of perioperative antibiotics.  The thoracic and lumbar areas were widely prepped with chloraprep and draped into a sterile field. 1% lidocaine was infiltrated into the patient's previous incision where his IPG was located. An incision made with a 10 blade and carried down to the IPG with sharp and blunt dissection. The IPG was freed, the single, previously placed lead removed from the IPG, and then the generator passed off the field. Fluoroscopy was used to identify the level at which the previous lead had been placed; the skin and subcutaneous tissues over the previous incision were infiltrated with lidocaine, incision made and dissected down to the lead. Retractors were placed and a 14g Medtronic tuohy needle placed into the epidural space at the L2-3 interspace using biplanar fluoro and loss-of-resistance technique. The needle was aspirated without any return of fluid. A Medtronic wide-spaced lead was introduced and under live AP fluoro advanced until the distal-most contact overlay the midportion of T7vertebral body shadow with the rest of the contacts distributed over the T8and T9 vertebral bodies  in a position at anatomic midline. The patient was awakened and the leads tested; impedances were good, and the patient reported good coverage with amplitudes in the 3-7 mA range. 0 silk sutures were placed in the fascia adjacent to the needles. The needles and stylets were removed under fluoroscopy with no lead migration noted. Leads were then fixed to the fascia by anchors; repeat images were obtained to verify that there had been no lead migration.  The incision was inspected and hemostasis obtained with the bipolar cautery.  Attention was then turned to creation of a subcutaneous pocket. At the left flank, a 3 cm incision was made with a 10 blade and using the bovie and blunt dissection a pocket of size appropriate to place a SCS generator. The pocket was trialed, and found to be of adequate size. The pocket was inspected for hemostasis, which was found to be excellent. Using reverse seldinger technique, the leads were tunneled to the pocket site, and the leads inserted into the SCS generator. Impedances were checked, and all found to be excellent. The leads were then all fixed into position with a self-torquing wrench. The wiring was all carefully coiled, placed behind the generator and placed in the pocket.  Both incisions were copiously irrigated with bacitracin-containing irrigation. The lumbar incision was closed in 2 deep layers of interrupted 2-0 vicryl and the skin closed with staples. The pocket incision was closed with a deeper layer of 2-0 vicryl interrupted sutures, and the skin closed with staples. Sterile dressings were applied. Needle, sponge, and instrument counts were correct x2 at the end of the case.  The patient was then carefully awakened from anesthesia, turned supine, an abdominal binder placed, and the  patient taken to the recovery room where he underwent complex spinal cord stimulator programming.  COMPLICATIONS: NONE  CONDITION: Stable throughout the course of the procedure and  immediately afterward  DISPOSITION: discharge to home, with antibiotics and pain medicine. Discussed care with the patient and spouse. Followup in clinic will be scheduled in 10-14 days.

## 2013-04-03 NOTE — Discharge Summary (Signed)
Physician Discharge Summary  Patient ID: Christian Bowen MRN: [id number] DOB/AGE: [birth year] 53 y.o.  Admit date: 04/02/2013 Discharge date: 04/03/2013  Admission Diagnoses:  Discharge Diagnoses:  Active Problems:   Lumbar post-laminectomy syndrome   Asthma   Discharged Condition: good  Hospital Course: pt scheduled for SCS implant 2/27, anticipated outpatient surgery. Patient had episode of asthma, question of reaction to IV hydromorphone in PACU. Treated with albuterol, benadryl, steroids, and kept overnight for observation.  No problems since PACU. Patient reports feeling well this morning, no issues with breathing. No nausea, vomiting. Ambulating, voiding without difficulty. Stable for discharge this a.m.  Consults: None  Significant Diagnostic Studies: none  Treatments: steroids: solu-medrol  Discharge Exam: Blood pressure 142/77, pulse 97, temperature 98.6 F (37 C), temperature source Oral, resp. rate 20, SpO2 96.00%. General appearance: alert, cooperative, no distress and morbidly obese Head: Normocephalic, without obvious abnormality, atraumatic Eyes: conjunctivae/corneas clear. PERRL, EOM's intact. Fundi benign. Resp: clear to auscultation bilaterally Cardio: regular rate and rhythm, S1, S2 normal, no murmur, click, rub or gallop Incision/Wound:clean, dry, intact.   Disposition: 01-Home or Self Care   Future Appointments Provider Department Dept Phone   05/04/2013 3:00 PM [facility] [phone]   05/04/2013 4:00 PM Waymon Budgelinton D Young, MD [facility] [phone]       Medication List         albuterol 108 (90 BASE) MCG/ACT inhaler  Commonly known as:  PROVENTIL HFA;VENTOLIN HFA  Inhale 2 puffs into the lungs every 6 (six) hours as needed for wheezing or shortness of breath.     atorvastatin 40 MG tablet  Commonly known as:  LIPITOR  Take 40 mg by mouth daily.     cephALEXin 500 MG capsule  Commonly known as:   KEFLEX  Take 1 capsule (500 mg total) by mouth 4 (four) times daily.     Fluticasone Furoate-Vilanterol 100-25 MCG/INH Aepb  Commonly known as:  BREO ELLIPTA  Inhale 1 puff into the lungs daily.     lisinopril-hydrochlorothiazide 20-25 MG per tablet  Commonly known as:  PRINZIDE,ZESTORETIC  Take 1 tablet by mouth daily.     oxyCODONE-acetaminophen 10-325 MG per tablet  Commonly known as:  PERCOCET  Take 1 tablet by mouth every 4 (four) hours as needed for pain.     sitaGLIPtin-metformin 50-1000 MG per tablet  Commonly known as:  JANUMET  Take 1 tablet by mouth 2 (two) times daily with a meal.         Signed: Kindred Reidinger C 04/03/2013, 8:20 AM

## 2013-04-03 NOTE — Progress Notes (Signed)
Discharge order received.  Gave patient and wife verbal and written discharge instructions about medications, follow up appointment and incision site care. Prescriptions given to wife. Discharged home via wheelchair with spouse.

## 2013-04-07 ENCOUNTER — Encounter (HOSPITAL_COMMUNITY): Payer: Self-pay | Admitting: Anesthesiology

## 2013-05-04 ENCOUNTER — Ambulatory Visit (INDEPENDENT_AMBULATORY_CARE_PROVIDER_SITE_OTHER): Payer: Medicare Other | Admitting: Internal Medicine

## 2013-05-04 ENCOUNTER — Encounter: Payer: Self-pay | Admitting: Internal Medicine

## 2013-05-04 VITALS — BP 136/94 | HR 105 | Ht 72.0 in | Wt 322.0 lb

## 2013-05-04 DIAGNOSIS — G4733 Obstructive sleep apnea (adult) (pediatric): Secondary | ICD-10-CM

## 2013-05-04 DIAGNOSIS — J45909 Unspecified asthma, uncomplicated: Secondary | ICD-10-CM

## 2013-05-04 DIAGNOSIS — Z720 Tobacco use: Secondary | ICD-10-CM

## 2013-05-04 DIAGNOSIS — F17200 Nicotine dependence, unspecified, uncomplicated: Secondary | ICD-10-CM

## 2013-05-04 DIAGNOSIS — J4540 Moderate persistent asthma, uncomplicated: Secondary | ICD-10-CM

## 2013-05-04 LAB — PULMONARY FUNCTION TEST
DL/VA % pred: 158 %
DL/VA: 7.51 ml/min/mmHg/L
DLCO unc % pred: 108 %
DLCO unc: 37.89 ml/min/mmHg
FEF 25-75 PRE: 1.62 L/s
FEF 25-75 Post: 1.74 L/sec
FEF2575-%CHANGE-POST: 7 %
FEF2575-%PRED-POST: 51 %
FEF2575-%Pred-Pre: 47 %
FEV1-%Change-Post: 3 %
FEV1-%PRED-PRE: 70 %
FEV1-%Pred-Post: 72 %
FEV1-POST: 2.56 L
FEV1-PRE: 2.49 L
FEV1FVC-%CHANGE-POST: 3 %
FEV1FVC-%PRED-PRE: 87 %
FEV6-%Change-Post: 0 %
FEV6-%Pred-Post: 80 %
FEV6-%Pred-Pre: 80 %
FEV6-Post: 3.5 L
FEV6-Pre: 3.5 L
FEV6FVC-%Change-Post: 0 %
FEV6FVC-%PRED-PRE: 101 %
FEV6FVC-%Pred-Post: 102 %
FVC-%Change-Post: 0 %
FVC-%Pred-Post: 78 %
FVC-%Pred-Pre: 79 %
FVC-Post: 3.52 L
FVC-Pre: 3.54 L
POST FEV1/FVC RATIO: 73 %
Post FEV6/FVC ratio: 100 %
Pre FEV1/FVC ratio: 70 %
Pre FEV6/FVC Ratio: 99 %
RV % pred: 88 %
RV: 1.94 L
TLC % pred: 73 %
TLC: 5.44 L

## 2013-05-04 MED ORDER — FLUTICASONE FUROATE-VILANTEROL 100-25 MCG/INH IN AEPB: 1.0000 | INHALATION_SPRAY | Freq: Every day | RESPIRATORY_TRACT | Status: DC

## 2013-05-04 MED ORDER — FLUTICASONE FUROATE-VILANTEROL 100-25 MCG/INH IN AEPB: INHALATION_SPRAY | RESPIRATORY_TRACT | Status: DC

## 2013-05-04 NOTE — Progress Notes (Signed)
02/16/13- [age] yoM smoker referred courtesy of Dr Windle GuardWilson Elkins-  c/o snores alot,does not sleep well,has had CPAP 2005 off for 5 yrs. after moving,back on x 1 yr. Now, pressure. not good- too weak NPSG done 10 years ago- searching paper chart. 09/23/11- Dr Marvetta Gibbons [name]- Surgery- UPPP/ turbinate reduction/ septoplasty. Has gained a great deal of weight since original sleep study. Bedtime 1 AM, sleep latency 60-90 minutes, awakening 3 or 4 times during the night before up at 7:30 AM. Thinks current CPAP pressure may be around 8. Asthma treated with albuterol nebulizer. Continues to smoke one quarter pack per day. Now has an incidental chest cold with some wheeze for the past week. Hypertension without cardiac disease.  05/04/13- 52 yoM smoker followed for OSA, s/p UPPP, tobacco abuse NPSG done 10 years ago follows for:   pt had his PFT done today.  here to review the results.  pt brought his cpap in today. Admits occasional cigarette. Counseled again. Continues CPAP 8 (?)/[facility]. Going to gym now trying to lose weight CXR 03/26/13 IMPRESSION:  No active disease.  Electronically Signed  By: Irish LackGlenn Yamagata M.D.  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 FVC 3.52/78%, FEV1 2.56/72%, FEV1/FVC 0.73/91%, FEF 25-75% 1.74/51%. TLC 73%, DLCO 108%.  ROS-see HPI Constitutional:   No-   weight loss, night sweats, fevers, chills, +fatigue, lassitude. HEENT:   No-  headaches, difficulty swallowing, tooth/dental problems, sore throat,       No-  sneezing, itching, ear ache, nasal congestion, post nasal drip,  CV:  No-   chest pain, orthopnea, PND, swelling in lower extremities, anasarca,                                  dizziness, palpitations Resp: No-   shortness of breath with exertion or at rest.              No-   productive cough,  No non-productive cough,  No- coughing up of blood.           No-   change in color of mucus.  + wheezing.   Skin: No-   rash or lesions. GI:  No-   heartburn, indigestion, abdominal pain, nausea, vomiting,  GU:  MS:  No-   joint pain or swelling.    + back pain. Neuro-     nothing unusual Psych:  No- change in mood or affect. No depression or anxiety.  No memory loss.  OBJ- Physical Exam General- Alert, Oriented, Affect-appropriate, Distress- none acute, +obese Skin- rash-none, lesions- none, excoriation- none Lymphadenopathy- none Head- atraumatic            Eyes- Gross vision intact, PERRLA, conjunctivae and secretions clear            Ears- Hearing, canals-normal            Nose- Clear, no-Septal dev, mucus, polyps, erosion, perforation             Throat- Mallampati II/ UPPP , mucosa clear , drainage- none, tonsils- atrophic Neck- flexible , trachea midline, no stridor , thyroid nl, carotid no bruit Chest - symmetrical excursion , unlabored           Heart/CV- RRR , no murmur , no gallop  , no rub, nl s1 s2                           -  JVD- none , edema- none, stasis changes- none, varices- none           Lung- , wheeze+Bilateral, cough- none , dullness-none, rub- none           Chest wall-  Abd-  Br/ Gen/ Rectal- Not done, not indicated Extrem- cyanosis- none, clubbing, none, atrophy- none, strength- nl Neuro- grossly intact to observation

## 2013-05-04 NOTE — Patient Instructions (Addendum)
Order- [facility]- download CPAP for pressure/ compliance    Dx OSA  Please keep trying to stop smoking  Sample and script for Breo Ellipta maintenance inhaler 1 puff then rinse mouth, once every day

## 2013-05-04 NOTE — Progress Notes (Signed)
PFT done today. 

## 2013-05-25 ENCOUNTER — Encounter (HOSPITAL_COMMUNITY): Payer: Self-pay | Admitting: Emergency Medicine

## 2013-05-25 ENCOUNTER — Emergency Department (INDEPENDENT_AMBULATORY_CARE_PROVIDER_SITE_OTHER)
Admission: EM | Admit: 2013-05-25 | Discharge: 2013-05-25 | Disposition: A | Discharge status: Home / Self Care | Payer: Medicare Other | Source: Home / Self Care | Attending: Emergency Medicine | Admitting: Emergency Medicine

## 2013-05-25 DIAGNOSIS — M5412 Radiculopathy, cervical region: Secondary | ICD-10-CM

## 2013-05-25 MED ORDER — HYDROCODONE-ACETAMINOPHEN 5-325 MG PO TABS: ORAL_TABLET | ORAL | Status: DC

## 2013-05-25 MED ORDER — METHOCARBAMOL 500 MG PO TABS: 500.0000 mg | ORAL_TABLET | Freq: Three times a day (TID) | ORAL | Status: DC

## 2013-05-25 MED ORDER — MELOXICAM 15 MG PO TABS: 15.0000 mg | ORAL_TABLET | Freq: Every day | ORAL | Status: DC

## 2013-05-25 MED ORDER — KETOROLAC TROMETHAMINE 60 MG/2ML IM SOLN: 60.0000 mg | Freq: Once | INTRAMUSCULAR | Status: DC

## 2013-05-25 MED ORDER — KETOROLAC TROMETHAMINE 60 MG/2ML IM SOLN
INTRAMUSCULAR | Status: AC
  Filled 2013-05-25: qty 2

## 2013-05-25 NOTE — ED Provider Notes (Signed)
Chief Complaint   Chief Complaint  Patient presents with  . Shoulder Pain    History of Present Illness   Christian Bowen is a [age] male who is had a one-month history of aching in both of his arms extending from the shoulders on down to the tips of the fingers. His arms feel numb and weak. He denies any neck pain. He has seen Dr. Madelon Lipsaffrey for this, and he did a routine x-rays which he states showed a disc problem. He was prescribed tramadol but does not feel any better. His neck has a full range of motion with no pain. He denies any headache, chest pain, or shortness of breath. There's been no swelling of the arms.  Review of Systems   Other than noted above, the patient denies any of the following symptoms: Constitutional:  No fever or chills. Neck:  No swelling, or adenopathy.   Cardiac:  No chest pain, tightness, or pressure. Respiratory:  No cough or dyspnea. M-S:  No joint pain, muscle pain, or back problems. Neuro:  No headache, muscle weakness, or paresthesias.  PMFSH   Past medical history, family history, social history, meds, and allergies were reviewed.  He has a history of diabetes, hypertension, and asthma. He takes blood pressure medication, lisinopril, Januvia, and albuterol.  Physical Examination    Vital signs:  BP 114/76  Pulse 110  Temp(Src) 98 F (36.7 C) (Oral)  Resp 20  SpO2 95% General:  Alert, oriented and in no distress. Eye:  PERRL, full EOMs. ENT:  Pharynx clear, no oral lesions. Neck:  The neck was nontender to palpation and have full range of motion with no pain.  Spurling's test was negative. Lungs:  No respiratory distress.  Breath sounds clear and equal bilaterally.  No wheezes, rales or rhonchi. Heart:  Regular rhythm.  No gallops, murmers, or rubs. Ext:  No upper extremity edema, pulses full.  Both shoulders were nontender to palpation and has full range of motion with some pain on abduction of the right shoulder. Full ROM of joints with no  joint or muscle pain to palpation. Neuro:  Alert and oriented times 3.  No focal muscle weakness.  DTRs symmetric.  Sensation intact to light touch. Skin: Clear, warm and dry.  No rash.  Good capillary refill.  Course in [facility]   Given Toradol 60 mg IM.  Assessment   The encounter diagnosis was Cervical radiculopathy.  Plan    1.  Meds:  The following meds were prescribed:   Discharge Medication List as of 05/25/2013  7:42 PM    START taking these medications   Details  HYDROcodone-acetaminophen (NORCO/VICODIN) 5-325 MG per tablet 1 to 2 tabs every 4 to 6 hours as needed for pain., Print    meloxicam (MOBIC) 15 MG tablet Take 1 tablet (15 mg total) by mouth daily., Starting 05/25/2013, Until Discontinued, Normal    methocarbamol (ROBAXIN) 500 MG tablet Take 1 tablet (500 mg total) by mouth 3 (three) times daily., Starting 05/25/2013, Until Discontinued, Normal        2.  Patient Education/Counseling:  The patient was given appropriate handouts, self care instructions, and instructed in symptomatic relief.  Given neck exercises to do.  3.  Follow up:  The patient was told to follow up here if no better in 3 to 4 days, or sooner if becoming worse in any way, and given some red flag symptoms such as worsening pain or new neurological symptoms which would prompt  immediate return.  Follow up with Dr. Madelon Lipsaffrey within the next couple weeks.     Reuben Likesavid C Dorine Duffey, MD 05/25/13 2232

## 2013-05-25 NOTE — ED Notes (Signed)
Seen in Dr Madelon Lipsaffrey office 1 month ago for pain in neck, shoulder, arm , and was reportedly was told it is a nerve in his neck causing his pain, and is to follow up early May. C/o tramadol is not helping his pain, and is unable to sleep at night due to hhis pain

## 2013-05-25 NOTE — Discharge Instructions (Signed)
TREATMENT  °Treatment initially involves the use of ice and medication to help reduce pain and inflammation. It is also important to perform strengthening and stretching exercises and modify activities that worsen symptoms so the injury does not get worse. These exercises may be performed at home or with a therapist. For patients who experience severe symptoms, a soft padded collar may be recommended to be worn around the neck.  °Improving your posture may help reduce symptoms. Posture improvement includes pulling your chin and abdomen in while sitting or standing. If you are sitting, sit in a firm chair with your buttocks against the back of the chair. While sleeping, try replacing your pillow with a small towel rolled to 2 inches in diameter, or use a cervical pillow. Poor sleeping positions delay healing.  ° °MEDICATION  °· If pain medication is necessary, nonsteroidal anti-inflammatory medications, such as aspirin and ibuprofen, or other minor pain relievers, such as acetaminophen, are often recommended. °· Do not take pain medication for 7 days before surgery. °· Prescription pain relievers may be given if deemed necessary by your caregiver. Use only as directed and only as much as you need. ° °HEAT AND COLD:  °· Cold treatment (icing) relieves pain and reduces inflammation. Cold treatment should be applied for 10 to 15 minutes every 2 to 3 hours for inflammation and pain and immediately after any activity that aggravates your symptoms. Use ice packs or an ice massage. °· Heat treatment may be used prior to performing the stretching and strengthening activities prescribed by your caregiver, physical therapist, or athletic trainer. Use a heat pack or a warm soak. ° °SEEK MEDICAL CARE IF:  °· Symptoms get worse or do not improve in 2 weeks despite treatment. °· New, unexplained symptoms develop (drugs used in treatment may produce side effects). ° °EXERCISES °RANGE OF MOTION (ROM) AND STRETCHING EXERCISES -  Cervical Strain and Sprain °These exercises may help you when beginning to rehabilitate your injury. In order to successfully resolve your symptoms, you must improve your posture. These exercises are designed to help reduce the forward-head and rounded-shoulder posture which contributes to this condition. Your symptoms may resolve with or without further involvement from your physician, physical therapist or athletic trainer. While completing these exercises, remember:  °· Restoring tissue flexibility helps normal motion to return to the joints. This allows healthier, less painful movement and activity. °· An effective stretch should be held for at least 20 seconds, although you may need to begin with shorter hold times for comfort. °· A stretch should never be painful. You should only feel a gentle lengthening or release in the stretched tissue. ° °STRETCH- Axial Extensors °· Lie on your back on the floor. You may bend your knees for comfort. Place a rolled up hand towel or dish towel, about 2 inches in diameter, under the part of your head that makes contact with the floor. °· Gently tuck your chin, as if trying to make a "double chin," until you feel a gentle stretch at the base of your head. °· Hold _____10_____ seconds. °Repeat _____10_____ times. Complete this exercise _____2_____ times per day.  ° °STRETECH - Axial Extension  °· Stand or sit on a firm surface. Assume a good posture: chest up, shoulders drawn back, abdominal muscles slightly tense, knees unlocked (if standing) and feet hip width apart. °· Slowly retract your chin so your head slides back and your chin slightly lowers.Continue to look straight ahead. °· You should feel a gentle stretch   in the back of your head. Be certain not to feel an aggressive stretch since this can cause headaches later. °· Hold for ____10______ seconds. °Repeat _____10_____ times. Complete this exercise ____2______ times per day. ° °STRETCH  Cervical Side Bend  °· Stand  or sit on a firm surface. Assume a good posture: chest up, shoulders drawn back, abdominal muscles slightly tense, knees unlocked (if standing) and feet hip width apart. °· Without letting your nose or shoulders move, slowly tip your right / left ear to your shoulder until your feel a gentle stretch in the muscles on the opposite side of your neck. °· Hold _____10_____ seconds. °Repeat _____10_____ times. Complete this exercise _____2_____ times per day. ° °STRETCH  Cervical Rotators  °· Stand or sit on a firm surface. Assume a good posture: chest up, shoulders drawn back, abdominal muscles slightly tense, knees unlocked (if standing) and feet hip width apart. °· Keeping your eyes level with the ground, slowly turn your head until you feel a gentle stretch along the back and opposite side of your neck. °· Hold _____10_____ seconds. °Repeat ____10______ times. Complete this exercise ____2______ times per day. ° °RANGE OF MOTION - Neck Circles  °· Stand or sit on a firm surface. Assume a good posture: chest up, shoulders drawn back, abdominal muscles slightly tense, knees unlocked (if standing) and feet hip width apart. °· Gently roll your head down and around from the back of one shoulder to the back of the other. The motion should never be forced or painful. °· Repeat the motion 10-20 times, or until you feel the neck muscles relax and loosen. °Repeat ____10______ times. Complete the exercise _____2_____ times per day. ° °STRENGTHENING EXERCISES - Cervical Strain and Sprain °These exercises may help you when beginning to rehabilitate your injury. They may resolve your symptoms with or without further involvement from your physician, physical therapist or athletic trainer. While completing these exercises, remember:  °· Muscles can gain both the endurance and the strength needed for everyday activities through controlled exercises. °· Complete these exercises as instructed by your physician, physical therapist or  athletic trainer. Progress the resistance and repetitions only as guided. °· You may experience muscle soreness or fatigue, but the pain or discomfort you are trying to eliminate should never worsen during these exercises. If this pain does worsen, stop and make certain you are following the directions exactly. If the pain is still present after adjustments, discontinue the exercise until you can discuss the trouble with your clinician. ° °STRENGTH Cervical Flexors, Isometric °· Face a wall, standing about 6 inches away. Place a small pillow, a ball about 6-8 inches in diameter, or a folded towel between your forehead and the wall. °· Slightly tuck your chin and gently push your forehead into the soft object. Push only with mild to moderate intensity, building up tension gradually. Keep your jaw and forehead relaxed. °· Hold 10 to 20 seconds. Keep your breathing relaxed. °· Release the tension slowly. Relax your neck muscles completely before you start the next repetition. °Repeat _____10_____ times. Complete this exercise _____2_____ times per day. ° °STRENGTH- Cervical Lateral Flexors, Isometric  °· Stand about 6 inches away from a wall. Place a small pillow, a ball about 6-8 inches in diameter, or a folded towel between the side of your head and the wall. °· Slightly tuck your chin and gently tilt your head into the soft object. Push only with mild to moderate intensity, building up tension gradually. Keep   your jaw and forehead relaxed. °· Hold 10 to 20 seconds. Keep your breathing relaxed. °· Release the tension slowly. Relax your neck muscles completely before you start the next repetition. °Repeat _____10_____ times. Complete this exercise ____2______ times per day. ° °STRENGTH  Cervical Extensors, Isometric  °· Stand about 6 inches away from a wall. Place a small pillow, a ball about 6-8 inches in diameter, or a folded towel between the back of your head and the wall. °· Slightly tuck your chin and gently  tilt your head back into the soft object. Push only with mild to moderate intensity, building up tension gradually. Keep your jaw and forehead relaxed. °· Hold 10 to 20 seconds. Keep your breathing relaxed. °· Release the tension slowly. Relax your neck muscles completely before you start the next repetition. °Repeat _____10_____ times. Complete this exercise _____2_____ times per day. ° °POSTURE AND BODY MECHANICS CONSIDERATIONS - Cervical Strain and Sprain °Keeping correct posture when sitting, standing or completing your activities will reduce the stress put on different body tissues, allowing injured tissues a chance to heal and limiting painful experiences. The following are general guidelines for improved posture. Your physician or physical therapist will provide you with any instructions specific to your needs. While reading these guidelines, remember: °· The exercises prescribed by your provider will help you have the flexibility and strength to maintain correct postures. °· The correct posture provides the optimal environment for your joints to work. All of your joints have less wear and tear when properly supported by a spine with good posture. This means you will experience a healthier, less painful body. °· Correct posture must be practiced with all of your activities, especially prolonged sitting and standing. Correct posture is as important when doing repetitive low-stress activities (typing) as it is when doing a single heavy-load activity (lifting). °PROLONGED STANDING WHILE SLIGHTLY LEANING FORWARD °When completing a task that requires you to lean forward while standing in one place for a long time, place either foot up on a stationary 2-4 inch high object to help maintain the best posture. When both feet are on the ground, the low back tends to lose its slight inward curve. If this curve flattens (or becomes too large), then the back and your other joints will experience too much stress, fatigue  more quickly and can cause pain.  °RESTING POSITIONS °Consider which positions are most painful for you when choosing a resting position. If you have pain with flexion-based activities (sitting, bending, stooping, squatting), choose a position that allows you to rest in a less flexed posture. You would want to avoid curling into a fetal position on your side. If your pain worsens with extension-based activities (prolonged standing, working overhead), avoid resting in an extended position such as sleeping on your stomach. Most people will find more comfort when they rest with their spine in a more neutral position, neither too rounded nor too arched. Lying on a non-sagging bed on your side with a pillow between your knees, or on your back with a pillow under your knees will often provide some relief. Keep in mind, being in any one position for a prolonged period of time, no matter how correct your posture, can still lead to stiffness. °WALKING °Walk with an upright posture. Your ears, shoulders and hips should all line-up. °OFFICE WORK °When working at a desk, create an environment that supports good, upright posture. Without extra support, muscles fatigue and lead to excessive strain on joints and other tissues. °  CHAIR: °· A chair should be able to slide under your desk when your back makes contact with the back of the chair. This allows you to work closely. °· The chair's height should allow your eyes to be level with the upper part of your monitor and your hands to be slightly lower than your elbows. °· Body position: °· Your feet should make contact with the floor. If this is not possible, use a foot rest. °· Keep your ears over your shoulders. This will reduce stress on your neck and low back. °Document Released: 01/21/2005 Document Revised: 04/15/2011 Document Reviewed: 05/05/2008 °ExitCare® Patient Information ©2013 [employer]. ° ° °

## 2013-05-30 ENCOUNTER — Encounter: Payer: Self-pay | Admitting: Internal Medicine

## 2013-05-30 NOTE — Assessment & Plan Note (Signed)
Obesity hypoventilation Counseled weight loss

## 2013-05-30 NOTE — Assessment & Plan Note (Signed)
Plan observation to decide if this is best considered persistent asthma or COPD Plan-Try Breo

## 2013-05-30 NOTE — Assessment & Plan Note (Signed)
Counseled to stop completely

## 2013-05-30 NOTE — Assessment & Plan Note (Signed)
Plan-DME [facility] to provide download for pressure and compliance

## 2013-06-21 ENCOUNTER — Telehealth: Payer: Self-pay | Admitting: Internal Medicine

## 2013-06-21 MED ORDER — ALBUTEROL SULFATE (2.5 MG/3ML) 0.083% IN NEBU: 2.5000 mg | INHALATION_SOLUTION | Freq: Four times a day (QID) | RESPIRATORY_TRACT | Status: DC | PRN

## 2013-06-21 NOTE — Telephone Encounter (Signed)
Per CDY: Albuterol 0.083% #75 vials refill PRN 1 vial [id number]   Pt aware RX has been sent. Nothing further needed

## 2013-06-21 NOTE — Telephone Encounter (Signed)
Called spoke with pt. He is needing a refill on albuterol nebulizer. This is not on current medication list. Pt was seen 05/04/13 by CDY. I do not see where this was ever filled by us or refilled. Please advise CDY thanks

## 2013-06-21 NOTE — Telephone Encounter (Signed)
Is he asking for nebulizer machine/ solution, or is asking for albuterol HFA rescue metered inhaler? He can have Rx for albuterol inhaler, # 1, 2 puffs every 4 hours if needed, refill prn

## 2013-06-21 NOTE — Telephone Encounter (Signed)
He has rescue inhaler. He is asking for albuterol neb solution for his machine. Please advise thanks

## 2013-06-28 ENCOUNTER — Encounter (HOSPITAL_COMMUNITY): Payer: Self-pay | Admitting: Emergency Medicine

## 2013-06-28 ENCOUNTER — Emergency Department (INDEPENDENT_AMBULATORY_CARE_PROVIDER_SITE_OTHER)
Admission: EM | Admit: 2013-06-28 | Discharge: 2013-06-28 | Disposition: A | Discharge status: Home / Self Care | Payer: Medicare Other | Source: Home / Self Care | Attending: Family Medicine | Admitting: Family Medicine

## 2013-06-28 DIAGNOSIS — J4540 Moderate persistent asthma, uncomplicated: Secondary | ICD-10-CM

## 2013-06-28 DIAGNOSIS — J45909 Unspecified asthma, uncomplicated: Secondary | ICD-10-CM

## 2013-06-28 MED ORDER — PREDNISONE 50 MG PO TABS: 50.0000 mg | ORAL_TABLET | Freq: Every day | ORAL | Status: DC

## 2013-06-28 MED ORDER — METHYLPREDNISOLONE ACETATE 80 MG/ML IJ SUSP
INTRAMUSCULAR | Status: AC
  Filled 2013-06-28: qty 1

## 2013-06-28 MED ORDER — IPRATROPIUM-ALBUTEROL 0.5-2.5 (3) MG/3ML IN SOLN
3.0000 mL | Freq: Once | RESPIRATORY_TRACT | Status: AC
  Administered 2013-06-28: 3 mL via RESPIRATORY_TRACT

## 2013-06-28 MED ORDER — METHYLPREDNISOLONE ACETATE 80 MG/ML IJ SUSP
80.0000 mg | Freq: Once | INTRAMUSCULAR | Status: AC
  Administered 2013-06-28: 80 mg via INTRAMUSCULAR

## 2013-06-28 MED ORDER — IPRATROPIUM-ALBUTEROL 0.5-2.5 (3) MG/3ML IN SOLN
RESPIRATORY_TRACT | Status: AC
  Filled 2013-06-28: qty 3

## 2013-06-28 NOTE — ED Notes (Signed)
Pt c/o chest tightness/congestion onset 3 weeks Sx include wheezing, SOB; hx of asthma Taking mucinex and his rescue inhalers Denies f/v/n/d, weakness, diaphoresis Alert w/no signs of acute distress.

## 2013-06-28 NOTE — ED Provider Notes (Signed)
Christian Bowen is a 53 y.o. male who presents to Urgent Care today for wheezing chest tightness cough and congestion. Symptoms present with past episodes of asthma exacerbation. He's been using albuterol which helps temporarily. No significant shortness of breath fevers chills nausea vomiting or diarrhea. He feels well otherwise.   Past Medical History  Diagnosis Date  . Hypertension   . Diabetes mellitus   . Asthma   . Obesity   . Sleep apnea     CPAP-has had UPPP-tonsils and turb red  . Apnea, sleep     was tested 2005   History  Substance Use Topics  . Smoking status: Current Every Day Smoker -- 0.25 packs/day for 30 years  . Smokeless tobacco: Never Used     Comment: 1 pack will last him 3-4 weeks  . Alcohol Use: Yes     Comment: 1 pint over the weekend...its not all the time   ROS as above Medications: No current facility-administered medications for this encounter.   Current Outpatient Prescriptions  Medication Sig Dispense Refill  . albuterol (PROVENTIL HFA;VENTOLIN HFA) 108 (90 BASE) MCG/ACT inhaler Inhale 2 puffs into the lungs every 6 (six) hours as needed for wheezing or shortness of breath.       Marland Kitchen albuterol (PROVENTIL) (2.5 MG/3ML) 0.083% nebulizer solution Take 3 mLs (2.5 mg total) by nebulization every 6 (six) hours as needed for wheezing or shortness of breath.  75 vial  prn  . lisinopril-hydrochlorothiazide (PRINZIDE,ZESTORETIC) 20-25 MG per tablet Take 1 tablet by mouth daily.      . sitaGLIPtan-metformin (JANUMET) 50-1000 MG per tablet Take 1 tablet by mouth 2 (two) times daily with a meal.      . atorvastatin (LIPITOR) 40 MG tablet Take 40 mg by mouth daily.      . Fluticasone Furoate-Vilanterol (BREO ELLIPTA) 100-25 MCG/INH AEPB Inhale 1 puff then rinse mouth, once daily -maintenance  1 each  prn  . Fluticasone Furoate-Vilanterol (BREO ELLIPTA) 100-25 MCG/INH AEPB Inhale 1 puff into the lungs daily.  1 each  0  . HYDROcodone-acetaminophen (NORCO/VICODIN) 5-325  MG per tablet 1 to 2 tabs every 4 to 6 hours as needed for pain.  20 tablet  0  . meloxicam (MOBIC) 15 MG tablet Take 1 tablet (15 mg total) by mouth daily.  15 tablet  0  . methocarbamol (ROBAXIN) 500 MG tablet Take 1 tablet (500 mg total) by mouth 3 (three) times daily.  30 tablet  0  . predniSONE (DELTASONE) 50 MG tablet Take 1 tablet (50 mg total) by mouth daily.  5 tablet  0    Exam:  BP 125/78  Pulse 88  Temp(Src) 98.3 F (36.8 C) (Oral)  Resp 18  SpO2 98% Gen: Well NAD HEENT: EOMI,  MMM Lungs: Normal work of breathing. Wheezing present bilaterally Heart: RRR no MRG Abd: NABS, Soft. NT, ND Exts: Brisk capillary refill, warm and well perfused.   Patient was given a DuoNeb nebulizer treatment and felt better  No results found for this or any previous visit (from the past 24 hour(s)). No results found.  Assessment and Plan: 53 y.o. male with asthma exacerbation. Plan to treat with IM Depo-Medrol and prednisone course. Continue albuterol.  Discussed warning signs or symptoms. Please see discharge instructions. Patient expresses understanding.    Rodolph Bong, MD 06/28/13 [phone]

## 2013-06-28 NOTE — Discharge Instructions (Signed)
Thank you for coming in today. Take prednisone daily for asthma starting tomorrow.  Call or go to the emergency room if you get worse, have trouble breathing, have chest pains, or palpitations.  Follow up as needed.   Asthma, Acute Bronchospasm Acute bronchospasm caused by asthma is also referred to as an asthma attack. Bronchospasm means your air passages become narrowed. The narrowing is caused by inflammation and tightening of the muscles in the air tubes (bronchi) in your lungs. This can make it hard to breath or cause you to wheeze and cough. CAUSES Possible triggers are:  Animal dander from the skin, hair, or feathers of animals.  Dust mites contained in house dust.  Cockroaches.  Pollen from trees or grass.  Mold.  Cigarette or tobacco smoke.  Air pollutants such as dust, household cleaners, hair sprays, aerosol sprays, paint fumes, strong chemicals, or strong odors.  Cold air or weather changes. Cold air may trigger inflammation. Winds increase molds and pollens in the air.  Strong emotions such as crying or laughing hard.  Stress.  Certain medicines such as aspirin or beta-blockers.  Sulfites in foods and drinks, such as dried fruits and wine.  Infections or inflammatory conditions, such as a flu, cold, or inflammation of the nasal membranes (rhinitis).  Gastroesophageal reflux disease (GERD). GERD is a condition where stomach acid backs up into your throat (esophagus).  Exercise or strenuous activity. SIGNS AND SYMPTOMS   Wheezing.  Excessive coughing, particularly at night.  Chest tightness.  Shortness of breath. DIAGNOSIS  Your health care provider will ask you about your medical history and perform a physical exam. A chest X-ray or blood testing may be performed to look for other causes of your symptoms or other conditions that may have triggered your asthma attack. TREATMENT  Treatment is aimed at reducing inflammation and opening up the airways in  your lungs. Most asthma attacks are treated with inhaled medicines. These include quick relief or rescue medicines (such as bronchodilators) and controller medicines (such as inhaled corticosteroids). These medicines are sometimes given through an inhaler or a nebulizer. Systemic steroid medicine taken by mouth or given through an IV tube also can be used to reduce the inflammation when an attack is moderate or severe. Antibiotic medicines are only used if a bacterial infection is present.  HOME CARE INSTRUCTIONS   Rest.  Drink plenty of liquids. This helps the mucus to remain thin and be easily coughed up. Only use caffeine in moderation and do not use alcohol until you have recovered from your illness.  Do not smoke. Avoid being exposed to secondhand smoke.  You play a critical role in keeping yourself in good health. Avoid exposure to things that cause you to wheeze or to have breathing problems.  Keep your medicines up to date and available. Carefully follow your health care provider's treatment plan.  Take your medicine exactly as prescribed.  When pollen or pollution is bad, keep windows closed and use an air conditioner or go to places with air conditioning.  Asthma requires careful medical care. See your health care provider for a follow-up as advised. If you are more than [redacted] weeks pregnant and you were prescribed any new medicines, let your obstetrician know about the visit and how you are doing. Follow-up with your health care provider as directed.  After you have recovered from your asthma attack, make an appointment with your outpatient doctor to talk about ways to reduce the likelihood of future attacks. If you  do not have a doctor who manages your asthma, make an appointment with a primary care doctor to discuss your asthma. SEEK IMMEDIATE MEDICAL CARE IF:   You are getting worse.  You have trouble breathing. If severe, call your local emergency services (911 in the  [place]  You develop chest pain or discomfort.  You are vomiting.  You are not able to keep fluids down.  You are coughing up yellow, green, brown, or bloody sputum.  You have a fever and your symptoms suddenly get worse.  You have trouble swallowing. MAKE SURE YOU:   Understand these instructions.  Will watch your condition.  Will get help right away if you are not doing well or get worse. Document Released: 05/08/2006 Document Revised: 09/23/2012 Document Reviewed: 07/29/2012 [facility] Patient Information 2014 [place], [place].

## 2013-06-30 ENCOUNTER — Telehealth: Payer: Self-pay | Admitting: Internal Medicine

## 2013-06-30 ENCOUNTER — Encounter (HOSPITAL_BASED_OUTPATIENT_CLINIC_OR_DEPARTMENT_OTHER): Payer: Self-pay | Admitting: *Deleted

## 2013-06-30 NOTE — Progress Notes (Signed)
Pt has had surgery here 2014-to bring all meds and cpap-and overnight bag-will need istat-had ekg 2/15

## 2013-06-30 NOTE — Telephone Encounter (Signed)
Spoke with pt. Reports that he hasn't been using his CPAP machine. States that [facility] came out and brought a machine to perform titration study. He has his own CPAP machine but thinks that the pressure hasn't been set because no one came out to change it.  I spoke to [place] at [facility] and she states they didn't receive any orders to change pressure settings. She is going to re-fax the download report to [place] so that [place] can look at it to make changes if needed.  Pt is aware that once we receive download we will let [place] look at it and make changes if necessary.  Will route to Katie to follow up on.

## 2013-07-01 ENCOUNTER — Other Ambulatory Visit: Payer: Self-pay | Admitting: Physician Assistant

## 2013-07-01 NOTE — H&P (Signed)
Christian Bowen is an 53 y.o. male.   Chief Complaint: right shoulder pain HPI: patient seen and evaluated in outpatient office for right shoulder complaints as well as bilat hand numbness and tingling.  Nerve conduction studies have revealed carpal tunnel but is more symptomatic with right shoulder pain.  History of left shoulder arthroscopy for impingement and is not interested in conservative treatment for right shoulder.  Review of systems positive for shortness of breath with recent asthma excerbation shoulder pain and hand n/t as mentioned above otherwise negative.    Past Medical History  Diagnosis Date  . Hypertension   . Diabetes mellitus   . Asthma   . Obesity   . Sleep apnea     CPAP-has had UPPP-tonsils and turb red  . Apnea, sleep     was tested 2005    Past Surgical History  Procedure Laterality Date  . Heel spurs      bilateral feel  . Spinal cord stimulator implant  1997  . Back surgery  2009  . Nasal turbinate reduction  8/13    septoplasty-UPPP-tonsils  . Ganglion cyst excision  2/14-[facility]    lt wrist   . Tonsillectomy    . Dorsal compartment release Left 08/13/2012    Procedure: RELEASE DORSAL COMPARTMENT (DEQUERVAIN) and exploration of radial nerve branch;  Surgeon: Tami Ribas, MD;  Location: [facility];  Service: Orthopedics;  Laterality: Left;  . Ganglion cyst excision Left 08/13/2012    Procedure: REMOVAL GANGLION OF WRIST;  Surgeon: Tami Ribas, MD;  Location: [facility];  Service: Orthopedics;  Laterality: Left;  . Spinal cord stimulator insertion N/A 04/02/2013    Procedure: LUMBAR SPINAL CORD STIMULATOR Insertion;  Surgeon: Gwynne Edinger, MD;  Location: [facility];  Service: Neurosurgery;  Laterality: N/A;    No family history on file. Social History:  reports that he has been smoking.  He has never used smokeless tobacco. He reports that he drinks alcohol. He reports that he uses illicit drugs (Marijuana).  Allergies:   Allergies  Allergen Reactions  . Hydromorphone Hcl     Patient went into respiratory distress     (Not in a hospital admission)  No results found for this or any previous visit (from the past 48 hour(s)). No results found.  ROS  There were no vitals taken for this visit. Physical Exam  Constitutional: He is oriented to person, place, and time. He appears well-developed and well-nourished.  obese  HENT:  Head: Normocephalic and atraumatic.  Nose: Nose normal.  Eyes: Conjunctivae and EOM are normal.  Neck: Normal range of motion. Neck supple.  Cardiovascular: Normal rate, regular rhythm and intact distal pulses.   Respiratory: Effort normal. No respiratory distress. He has wheezes.  GI: Soft. He exhibits no distension. There is no tenderness.  Musculoskeletal:       Right shoulder: He exhibits decreased range of motion, tenderness, bony tenderness and pain.  bilat wrist/hand numbness and tingling on tinel testing  Neurological: He is alert and oriented to person, place, and time.  Skin: Skin is warm and dry. No rash noted. No erythema.  Psychiatric: He has a normal mood and affect. His behavior is normal.     Assessment/Plan Right shoulder impingement  As mentioned above pt not interested in conservative treatment for impingement symptoms.  Has done well with the other shoulder discussed risks and benefits of right shoulder arthroscopy debridement acromioplasty possible distal clavicle excision and patient wishes to  proceed.  This will be performed outpatient.    Kripa Foskey 07/01/2013, 5:34 PM    

## 2013-07-02 ENCOUNTER — Encounter (HOSPITAL_BASED_OUTPATIENT_CLINIC_OR_DEPARTMENT_OTHER)
Admission: RE | Disposition: A | Discharge status: Home / Self Care | Payer: Self-pay | Source: Ambulatory Visit | Attending: Orthopedic Surgery

## 2013-07-02 ENCOUNTER — Ambulatory Visit (HOSPITAL_BASED_OUTPATIENT_CLINIC_OR_DEPARTMENT_OTHER)
Admission: RE | Admit: 2013-07-02 | Discharge: 2013-07-03 | Disposition: A | Discharge status: Home / Self Care | Payer: Medicare Other | Source: Ambulatory Visit | Attending: Orthopedic Surgery | Admitting: Orthopedic Surgery

## 2013-07-02 ENCOUNTER — Encounter (HOSPITAL_BASED_OUTPATIENT_CLINIC_OR_DEPARTMENT_OTHER): Payer: Medicare Other | Admitting: Anesthesiology

## 2013-07-02 ENCOUNTER — Ambulatory Visit (HOSPITAL_BASED_OUTPATIENT_CLINIC_OR_DEPARTMENT_OTHER): Payer: Medicare Other | Admitting: Anesthesiology

## 2013-07-02 ENCOUNTER — Encounter (HOSPITAL_BASED_OUTPATIENT_CLINIC_OR_DEPARTMENT_OTHER): Payer: Self-pay | Admitting: Anesthesiology

## 2013-07-02 DIAGNOSIS — I10 Essential (primary) hypertension: Secondary | ICD-10-CM | POA: Insufficient documentation

## 2013-07-02 DIAGNOSIS — J45909 Unspecified asthma, uncomplicated: Secondary | ICD-10-CM | POA: Insufficient documentation

## 2013-07-02 DIAGNOSIS — F17200 Nicotine dependence, unspecified, uncomplicated: Secondary | ICD-10-CM | POA: Insufficient documentation

## 2013-07-02 DIAGNOSIS — G4730 Sleep apnea, unspecified: Secondary | ICD-10-CM | POA: Diagnosis not present

## 2013-07-02 DIAGNOSIS — S43439A Superior glenoid labrum lesion of unspecified shoulder, initial encounter: Secondary | ICD-10-CM | POA: Diagnosis present

## 2013-07-02 DIAGNOSIS — M25819 Other specified joint disorders, unspecified shoulder: Secondary | ICD-10-CM | POA: Insufficient documentation

## 2013-07-02 DIAGNOSIS — E119 Type 2 diabetes mellitus without complications: Secondary | ICD-10-CM | POA: Diagnosis not present

## 2013-07-02 DIAGNOSIS — S43429A Sprain of unspecified rotator cuff capsule, initial encounter: Secondary | ICD-10-CM | POA: Diagnosis not present

## 2013-07-02 DIAGNOSIS — M7580 Other shoulder lesions, unspecified shoulder: Secondary | ICD-10-CM

## 2013-07-02 DIAGNOSIS — X58XXXA Exposure to other specified factors, initial encounter: Secondary | ICD-10-CM | POA: Insufficient documentation

## 2013-07-02 HISTORY — PX: SHOULDER ARTHROSCOPY: SHX128

## 2013-07-02 LAB — POCT I-STAT, CHEM 8
BUN: 16 mg/dL (ref 6–23)
Calcium, Ion: 1.22 mmol/L (ref 1.12–1.23)
Chloride: 102 mEq/L (ref 96–112)
Creatinine, Ser: 0.8 mg/dL (ref 0.50–1.35)
Glucose, Bld: 225 mg/dL — ABNORMAL HIGH (ref 70–99)
HCT: 44 % (ref 39.0–52.0)
Hemoglobin: 15 g/dL (ref 13.0–17.0)
POTASSIUM: 4.1 meq/L (ref 3.7–5.3)
SODIUM: 140 meq/L (ref 137–147)
TCO2: 27 mmol/L (ref 0–100)

## 2013-07-02 LAB — GLUCOSE, CAPILLARY: GLUCOSE-CAPILLARY: 210 mg/dL — AB (ref 70–99)

## 2013-07-02 SURGERY — ARTHROSCOPY, SHOULDER
Anesthesia: Regional | Site: Shoulder | Laterality: Right

## 2013-07-02 MED ORDER — ONDANSETRON HCL 4 MG PO TABS: 4.0000 mg | ORAL_TABLET | Freq: Four times a day (QID) | ORAL | Status: DC | PRN

## 2013-07-02 MED ORDER — OXYCODONE HCL 5 MG/5ML PO SOLN: 5.0000 mg | Freq: Once | ORAL | Status: AC | PRN

## 2013-07-02 MED ORDER — SODIUM CHLORIDE 0.9 % IV SOLN
INTRAVENOUS | Status: DC
  Administered 2013-07-02: 75 mL/h via INTRAVENOUS
  Administered 2013-07-02: 10 mL/h via INTRAVENOUS

## 2013-07-02 MED ORDER — METHOCARBAMOL 1000 MG/10ML IJ SOLN: 500.0000 mg | Freq: Four times a day (QID) | INTRAVENOUS | Status: DC | PRN

## 2013-07-02 MED ORDER — FENTANYL CITRATE 0.05 MG/ML IJ SOLN
50.0000 ug | INTRAMUSCULAR | Status: DC | PRN
  Administered 2013-07-02: 100 ug via INTRAVENOUS

## 2013-07-02 MED ORDER — SODIUM CHLORIDE 0.9 % IR SOLN
Status: DC | PRN
  Administered 2013-07-02: 3000 mL

## 2013-07-02 MED ORDER — BUPIVACAINE-EPINEPHRINE (PF) 0.5% -1:200000 IJ SOLN
INTRAMUSCULAR | Status: DC | PRN
  Administered 2013-07-02: 30 mL

## 2013-07-02 MED ORDER — LIDOCAINE HCL (CARDIAC) 20 MG/ML IV SOLN
INTRAVENOUS | Status: DC | PRN
  Administered 2013-07-02 (×2): 30 mg via INTRAVENOUS

## 2013-07-02 MED ORDER — FENTANYL CITRATE 0.05 MG/ML IJ SOLN
INTRAMUSCULAR | Status: AC
  Filled 2013-07-02: qty 4

## 2013-07-02 MED ORDER — MIDAZOLAM HCL 2 MG/2ML IJ SOLN
INTRAMUSCULAR | Status: AC
  Filled 2013-07-02: qty 2

## 2013-07-02 MED ORDER — DOCUSATE SODIUM 100 MG PO CAPS: 100.0000 mg | ORAL_CAPSULE | Freq: Two times a day (BID) | ORAL | Status: DC

## 2013-07-02 MED ORDER — METOCLOPRAMIDE HCL 5 MG PO TABS: 5.0000 mg | ORAL_TABLET | Freq: Three times a day (TID) | ORAL | Status: DC | PRN

## 2013-07-02 MED ORDER — DEXTROSE 5 % IV SOLN: 3.0000 g | INTRAVENOUS | Status: AC

## 2013-07-02 MED ORDER — OXYCODONE HCL 5 MG PO TABS
5.0000 mg | ORAL_TABLET | Freq: Once | ORAL | Status: AC | PRN
  Administered 2013-07-02: 5 mg via ORAL
  Filled 2013-07-02: qty 1

## 2013-07-02 MED ORDER — LEVALBUTEROL HCL 1.25 MG/0.5ML IN NEBU
1.2500 mg | INHALATION_SOLUTION | Freq: Four times a day (QID) | RESPIRATORY_TRACT | Status: DC | PRN
  Administered 2013-07-02: 1.25 mg via RESPIRATORY_TRACT

## 2013-07-02 MED ORDER — SUCCINYLCHOLINE CHLORIDE 20 MG/ML IJ SOLN
INTRAMUSCULAR | Status: DC | PRN
  Administered 2013-07-02: 50 mg via INTRAVENOUS

## 2013-07-02 MED ORDER — ALBUTEROL SULFATE (2.5 MG/3ML) 0.083% IN NEBU
INHALATION_SOLUTION | RESPIRATORY_TRACT | Status: AC
  Filled 2013-07-02: qty 3

## 2013-07-02 MED ORDER — NEOSTIGMINE METHYLSULFATE 10 MG/10ML IV SOLN
INTRAVENOUS | Status: DC | PRN
  Administered 2013-07-02: 4 mg via INTRAVENOUS

## 2013-07-02 MED ORDER — CEFAZOLIN SODIUM-DEXTROSE 2-3 GM-% IV SOLR
INTRAVENOUS | Status: AC
  Filled 2013-07-02: qty 50

## 2013-07-02 MED ORDER — PROPOFOL 10 MG/ML IV BOLUS
INTRAVENOUS | Status: DC | PRN
  Administered 2013-07-02: 200 mg via INTRAVENOUS

## 2013-07-02 MED ORDER — SODIUM CHLORIDE 0.9 % IV SOLN: INTRAVENOUS | Status: DC

## 2013-07-02 MED ORDER — ALBUTEROL SULFATE HFA 108 (90 BASE) MCG/ACT IN AERS
INHALATION_SPRAY | RESPIRATORY_TRACT | Status: DC | PRN
  Administered 2013-07-02: 4 via RESPIRATORY_TRACT

## 2013-07-02 MED ORDER — METOCLOPRAMIDE HCL 5 MG/ML IJ SOLN: 5.0000 mg | Freq: Three times a day (TID) | INTRAMUSCULAR | Status: DC | PRN

## 2013-07-02 MED ORDER — LACTATED RINGERS IV SOLN
INTRAVENOUS | Status: DC
  Administered 2013-07-02: 09:00:00 via INTRAVENOUS

## 2013-07-02 MED ORDER — ALBUTEROL SULFATE (2.5 MG/3ML) 0.083% IN NEBU
2.5000 mg | INHALATION_SOLUTION | Freq: Once | RESPIRATORY_TRACT | Status: AC
  Administered 2013-07-02: 2.5 mg via RESPIRATORY_TRACT

## 2013-07-02 MED ORDER — ONDANSETRON HCL 4 MG/2ML IJ SOLN: 4.0000 mg | Freq: Four times a day (QID) | INTRAMUSCULAR | Status: DC | PRN

## 2013-07-02 MED ORDER — METHOCARBAMOL 500 MG PO TABS
500.0000 mg | ORAL_TABLET | Freq: Four times a day (QID) | ORAL | Status: DC | PRN
  Administered 2013-07-02: 500 mg via ORAL
  Filled 2013-07-02: qty 1

## 2013-07-02 MED ORDER — GLYCOPYRROLATE 0.2 MG/ML IJ SOLN
INTRAMUSCULAR | Status: DC | PRN
  Administered 2013-07-02: 0.6 mg via INTRAVENOUS

## 2013-07-02 MED ORDER — CEFAZOLIN SODIUM 1-5 GM-% IV SOLN
INTRAVENOUS | Status: AC
  Filled 2013-07-02: qty 50

## 2013-07-02 MED ORDER — MORPHINE SULFATE 2 MG/ML IJ SOLN: 1.0000 mg | INTRAMUSCULAR | Status: DC | PRN

## 2013-07-02 MED ORDER — HYDROCODONE-ACETAMINOPHEN 7.5-325 MG PO TABS: 1.0000 | ORAL_TABLET | ORAL | Status: DC | PRN

## 2013-07-02 MED ORDER — SUCCINYLCHOLINE CHLORIDE 20 MG/ML IJ SOLN: INTRAMUSCULAR | Status: DC | PRN

## 2013-07-02 MED ORDER — ROCURONIUM BROMIDE 100 MG/10ML IV SOLN
INTRAVENOUS | Status: DC | PRN
  Administered 2013-07-02: 20 mg via INTRAVENOUS

## 2013-07-02 MED ORDER — CEFAZOLIN SODIUM-DEXTROSE 2-3 GM-% IV SOLR
2.0000 g | Freq: Four times a day (QID) | INTRAVENOUS | Status: AC
  Administered 2013-07-02 – 2013-07-03 (×3): 2 g via INTRAVENOUS

## 2013-07-02 MED ORDER — SODIUM CHLORIDE 0.9 % IN NEBU
INHALATION_SOLUTION | RESPIRATORY_TRACT | Status: AC
  Filled 2013-07-02: qty 3

## 2013-07-02 MED ORDER — MIDAZOLAM HCL 2 MG/2ML IJ SOLN
1.0000 mg | INTRAMUSCULAR | Status: DC | PRN
  Administered 2013-07-02: 2 mg via INTRAVENOUS

## 2013-07-02 MED ORDER — CHLORHEXIDINE GLUCONATE 4 % EX LIQD: 60.0000 mL | Freq: Once | CUTANEOUS | Status: DC

## 2013-07-02 MED ORDER — FENTANYL CITRATE 0.05 MG/ML IJ SOLN
INTRAMUSCULAR | Status: AC
  Filled 2013-07-02: qty 2

## 2013-07-02 MED ORDER — DEXAMETHASONE SODIUM PHOSPHATE 4 MG/ML IJ SOLN
INTRAMUSCULAR | Status: DC | PRN
  Administered 2013-07-02: 10 mg via INTRAVENOUS

## 2013-07-02 SURGICAL SUPPLY — 83 items
BENZOIN TINCTURE PRP APPL 2/3 (GAUZE/BANDAGES/DRESSINGS) IMPLANT
BLADE 10 SAFETY STRL DISP (BLADE) IMPLANT
BLADE 15 SAFETY STRL DISP (BLADE) IMPLANT
BLADE 4.2CUDA (BLADE) ×2 IMPLANT
BLADE AVERAGE 25X9 (BLADE) IMPLANT
BLADE CUTTER GATOR 3.5 (BLADE) IMPLANT
BLADE SURG 15 STRL LF DISP TIS (BLADE) ×1 IMPLANT
BLADE SURG 15 STRL SS (BLADE) ×1
BLADE VORTEX 6.0 (BLADE) ×2 IMPLANT
BUR 3.5 LG SPHERICAL (BURR) IMPLANT
BUR EGG 3PK/BX (BURR) IMPLANT
BUR OVAL 4.0 (BURR) IMPLANT
BUR OVAL 6.0 (BURR) IMPLANT
BUR VERTEX HOODED 4.5 (BURR) IMPLANT
BURR 3.5 LG SPHERICAL (BURR)
CANISTER SUCT 3000ML (MISCELLANEOUS) IMPLANT
CANNULA SHOULDER 7CM (CANNULA) ×2 IMPLANT
CLEANER CAUTERY TIP 5X5 PAD (MISCELLANEOUS) IMPLANT
CUTTER MENISCUS  4.2MM (BLADE) ×1
CUTTER MENISCUS 4.2MM (BLADE) ×1 IMPLANT
DECANTER SPIKE VIAL GLASS SM (MISCELLANEOUS) IMPLANT
DRAPE STERI 35X30 U-POUCH (DRAPES) ×2 IMPLANT
DRAPE SURG 17X23 STRL (DRAPES) ×2 IMPLANT
DRAPE U-SHAPE 76X120 STRL (DRAPES) ×4 IMPLANT
DRSG EMULSION OIL 3X3 NADH (GAUZE/BANDAGES/DRESSINGS) ×2 IMPLANT
DRSG PAD ABDOMINAL 8X10 ST (GAUZE/BANDAGES/DRESSINGS) ×2 IMPLANT
DURAPREP 26ML APPLICATOR (WOUND CARE) ×2 IMPLANT
ELECT REM PT RETURN 9FT ADLT (ELECTROSURGICAL) ×2
ELECTRODE REM PT RTRN 9FT ADLT (ELECTROSURGICAL) ×1 IMPLANT
GAUZE SPONGE 4X4 12PLY STRL (GAUZE/BANDAGES/DRESSINGS) ×2 IMPLANT
GLOVE BIO SURGEON STRL SZ 6.5 (GLOVE) ×2 IMPLANT
GLOVE BIO SURGEON STRL SZ7.5 (GLOVE) IMPLANT
GLOVE BIOGEL M STRL SZ7.5 (GLOVE) ×2 IMPLANT
GLOVE BIOGEL PI IND STRL 6.5 (GLOVE) ×1 IMPLANT
GLOVE BIOGEL PI IND STRL 8 (GLOVE) ×3 IMPLANT
GLOVE BIOGEL PI IND STRL 8.5 (GLOVE) IMPLANT
GLOVE BIOGEL PI INDICATOR 6.5 (GLOVE) ×1
GLOVE BIOGEL PI INDICATOR 8 (GLOVE) ×3
GLOVE BIOGEL PI INDICATOR 8.5 (GLOVE)
GLOVE EXAM NITRILE MD LF STRL (GLOVE) ×2 IMPLANT
GLOVE SURG ORTHO 8.0 STRL STRW (GLOVE) ×2 IMPLANT
GOWN STRL REUS W/ TWL LRG LVL3 (GOWN DISPOSABLE) ×1 IMPLANT
GOWN STRL REUS W/ TWL XL LVL3 (GOWN DISPOSABLE) ×1 IMPLANT
GOWN STRL REUS W/TWL LRG LVL3 (GOWN DISPOSABLE) ×1
GOWN STRL REUS W/TWL XL LVL3 (GOWN DISPOSABLE) ×1
MANIFOLD NEPTUNE II (INSTRUMENTS) ×2 IMPLANT
NEEDLE 1/2 CIR CATGUT .05X1.09 (NEEDLE) IMPLANT
NS IRRIG 1000ML POUR BTL (IV SOLUTION) ×2 IMPLANT
PACK ARTHROSCOPY DSU (CUSTOM PROCEDURE TRAY) ×2 IMPLANT
PACK BASIN DAY SURGERY FS (CUSTOM PROCEDURE TRAY) ×2 IMPLANT
PAD CLEANER CAUTERY TIP 5X5 (MISCELLANEOUS)
PAD ORTHO SHOULDER 7X19 LRG (SOFTGOODS) IMPLANT
PENCIL BUTTON HOLSTER BLD 10FT (ELECTRODE) IMPLANT
SET ARTHROSCOPY TUBING (MISCELLANEOUS) ×1
SET ARTHROSCOPY TUBING LN (MISCELLANEOUS) ×1 IMPLANT
SLEEVE SCD COMPRESS KNEE MED (MISCELLANEOUS) ×2 IMPLANT
SLING ARM LRG ADULT FOAM STRAP (SOFTGOODS) IMPLANT
SLING ARM MED ADULT FOAM STRAP (SOFTGOODS) IMPLANT
SLING ARM XL FOAM STRAP (SOFTGOODS) ×2 IMPLANT
SLING ULTRA II MEDIUM (SOFTGOODS) IMPLANT
SLING ULTRA II SMALL (SOFTGOODS) IMPLANT
SPONGE LAP 4X18 X RAY DECT (DISPOSABLE) ×2 IMPLANT
STAPLER VISISTAT 35W (STAPLE) IMPLANT
STRIP CLOSURE SKIN 1/2X4 (GAUZE/BANDAGES/DRESSINGS) IMPLANT
SUCTION FRAZIER TIP 10 FR DISP (SUCTIONS) IMPLANT
SUT BONE WAX W31G (SUTURE) IMPLANT
SUT ETHIBOND 2 OS 4 DA (SUTURE) IMPLANT
SUT ETHILON 4 0 PS 2 18 (SUTURE) ×2 IMPLANT
SUT FIBERWIRE #2 38 T-5 BLUE (SUTURE)
SUT PROLENE 3 0 PS 2 (SUTURE) IMPLANT
SUT TICRON 1 T 12 (SUTURE) IMPLANT
SUT VIC AB 0 CT1 27 (SUTURE)
SUT VIC AB 0 CT1 27XBRD ANBCTR (SUTURE) IMPLANT
SUT VIC AB 1 CT1 27 (SUTURE)
SUT VIC AB 1 CT1 27XBRD ANBCTR (SUTURE) IMPLANT
SUT VIC AB 2-0 PS2 27 (SUTURE) IMPLANT
SUT VIC AB 2-0 SH 27 (SUTURE)
SUT VIC AB 2-0 SH 27XBRD (SUTURE) IMPLANT
SUTURE FIBERWR #2 38 T-5 BLUE (SUTURE) IMPLANT
TOWEL OR 17X24 6PK STRL BLUE (TOWEL DISPOSABLE) ×2 IMPLANT
WAND STAR VAC 90 (SURGICAL WAND) IMPLANT
WATER STERILE IRR 1000ML POUR (IV SOLUTION) ×2 IMPLANT
YANKAUER SUCT BULB TIP NO VENT (SUCTIONS) IMPLANT

## 2013-07-02 NOTE — H&P (View-Only) (Signed)
Christian Bowen is an 53 y.o. male.   Chief Complaint: right shoulder pain HPI: patient seen and evaluated in outpatient office for right shoulder complaints as well as bilat hand numbness and tingling.  Nerve conduction studies have revealed carpal tunnel but is more symptomatic with right shoulder pain.  History of left shoulder arthroscopy for impingement and is not interested in conservative treatment for right shoulder.  Review of systems positive for shortness of breath with recent asthma excerbation shoulder pain and hand n/t as mentioned above otherwise negative.    Past Medical History  Diagnosis Date  . Hypertension   . Diabetes mellitus   . Asthma   . Obesity   . Sleep apnea     CPAP-has had UPPP-tonsils and turb red  . Apnea, sleep     was tested 2005    Past Surgical History  Procedure Laterality Date  . Heel spurs      bilateral feel  . Spinal cord stimulator implant  1997  . Back surgery  2009  . Nasal turbinate reduction  8/13    septoplasty-UPPP-tonsils  . Ganglion cyst excision  2/14-[facility]    lt wrist   . Tonsillectomy    . Dorsal compartment release Left 08/13/2012    Procedure: RELEASE DORSAL COMPARTMENT (DEQUERVAIN) and exploration of radial nerve branch;  Surgeon: Tami Ribas, MD;  Location: [facility];  Service: Orthopedics;  Laterality: Left;  . Ganglion cyst excision Left 08/13/2012    Procedure: REMOVAL GANGLION OF WRIST;  Surgeon: Tami Ribas, MD;  Location: [facility];  Service: Orthopedics;  Laterality: Left;  . Spinal cord stimulator insertion N/A 04/02/2013    Procedure: LUMBAR SPINAL CORD STIMULATOR Insertion;  Surgeon: Gwynne Edinger, MD;  Location: [facility];  Service: Neurosurgery;  Laterality: N/A;    No family history on file. Social History:  reports that he has been smoking.  He has never used smokeless tobacco. He reports that he drinks alcohol. He reports that he uses illicit drugs (Marijuana).  Allergies:   Allergies  Allergen Reactions  . Hydromorphone Hcl     Patient went into respiratory distress     (Not in a hospital admission)  No results found for this or any previous visit (from the past 48 hour(s)). No results found.  ROS  There were no vitals taken for this visit. Physical Exam  Constitutional: He is oriented to person, place, and time. He appears well-developed and well-nourished.  obese  HENT:  Head: Normocephalic and atraumatic.  Nose: Nose normal.  Eyes: Conjunctivae and EOM are normal.  Neck: Normal range of motion. Neck supple.  Cardiovascular: Normal rate, regular rhythm and intact distal pulses.   Respiratory: Effort normal. No respiratory distress. He has wheezes.  GI: Soft. He exhibits no distension. There is no tenderness.  Musculoskeletal:       Right shoulder: He exhibits decreased range of motion, tenderness, bony tenderness and pain.  bilat wrist/hand numbness and tingling on tinel testing  Neurological: He is alert and oriented to person, place, and time.  Skin: Skin is warm and dry. No rash noted. No erythema.  Psychiatric: He has a normal mood and affect. His behavior is normal.     Assessment/Plan Right shoulder impingement  As mentioned above pt not interested in conservative treatment for impingement symptoms.  Has done well with the other shoulder discussed risks and benefits of right shoulder arthroscopy debridement acromioplasty possible distal clavicle excision and patient wishes to  proceed.  This will be performed outpatient.    Margart SicklesJoshua Teryn Gust 07/01/2013, 5:34 PM

## 2013-07-02 NOTE — Interval H&P Note (Signed)
History and Physical Interval Note:  07/02/2013 7:26 AM  Christian Bowen  has presented today for surgery, with the diagnosis of Right Shoulder: Disorder Articular Cartilage - Shoulder, Disorders of Bursae and Tendons in Shoulder Region, Unspecified   The various methods of treatment have been discussed with the patient and family. After consideration of risks, benefits and other options for treatment, the patient has consented to  Procedure(s): RIGHT SHOULDER ARTHROSCOPY WITH DEBRIDEMENT EXTENSIVE, SUBACROMIAL DECOMPRESSION, PARTIAL ACROMIOPLASTY WITH CORACOACROMIAL RELEASE (Right) as a surgical intervention .  [name] history has been reviewed, patient examined, no change in status, stable for surgery.  I have reviewed the patient's chart and labs.  Questions were answered to the patient's satisfaction.     Thera Flake.

## 2013-07-02 NOTE — Discharge Instructions (Signed)
Diet: As you were doing prior to hospitalization   Activity: Increase activity slowly as tolerated  No lifting or driving for 6 weeks   Shower: May shower without a dressing on post op day #2, NO SOAKING in tub   Dressing: You may change your dressing on post op day #2.  Then change the dressing daily with sterile 4"x4"s gauze dressing  Or band aids.   Weight Bearing: no lifting with operative arm, pendelum exercises only until follow up May remove sling to shower and do exercises.   To prevent constipation: you may use a stool softener such as -  Colace ( over the counter) 100 mg by mouth twice a day  Drink plenty of fluids ( prune juice may be helpful) and high fiber foods  Miralax ( over the counter) for constipation as needed.   Precautions: If you experience chest pain or shortness of breath - call 911 immediately For transfer to the hospital emergency department!!  If you develop a fever greater that 101 F, purulent drainage from wound, increased redness or drainage from wound, or calf pain -- Call the office   Follow- Up Appointment: Please call for an appointment to be seen in 1 week [place] - [phone]    Post Anesthesia Home Care Instructions  Activity: Get plenty of rest for the remainder of the day. A responsible adult should stay with you for 24 hours following the procedure.  For the next 24 hours, DO NOT: -Drive a car -Advertising copywriter -Drink alcoholic beverages -Take any medication unless instructed by your physician -Make any legal decisions or sign important papers.  Meals: Start with liquid foods such as gelatin or soup. Progress to regular foods as tolerated. Avoid greasy, spicy, heavy foods. If nausea and/or vomiting occur, drink only clear liquids until the nausea and/or vomiting subsides. Call your physician if vomiting continues.  Special Instructions/Symptoms: Your throat may feel dry or sore from the anesthesia or the breathing tube placed  in your throat during surgery. If this causes discomfort, gargle with warm salt water. The discomfort should disappear within 24 hours.   Regional Anesthesia Blocks  1. Numbness or the inability to move the "blocked" extremity may last from 3-48 hours after placement. The length of time depends on the medication injected and your individual response to the medication. If the numbness is not going away after 48 hours, call your surgeon.  2. The extremity that is blocked will need to be protected until the numbness is gone and the  Strength has returned. Because you cannot feel it, you will need to take extra care to avoid injury. Because it may be weak, you may have difficulty moving it or using it. You may not know what position it is in without looking at it while the block is in effect.  3. For blocks in the legs and feet, returning to weight bearing and walking needs to be done carefully. You will need to wait until the numbness is entirely gone and the strength has returned. You should be able to move your leg and foot normally before you try and bear weight or walk. You will need someone to be with you when you first try to ensure you do not fall and possibly risk injury.  4. Bruising and tenderness at the needle site are common side effects and will resolve in a few days.  5. Persistent numbness or new problems with movement should be communicated to the surgeon or the [facility] [phone] [place] [phone]).

## 2013-07-02 NOTE — Brief Op Note (Signed)
07/02/2013  12:56 PM  PATIENT:  Christian Bowen  53 y.o. male  PRE-OPERATIVE DIAGNOSIS:  Right Shoulder: Disorder Articular Cartilage - Shoulder, Disorders of Bursae and Tendons in Shoulder Region, Unspecified   POST-OPERATIVE DIAGNOSIS:  Right Shoulder: Disorder Articular Cartilage - Shoulder, Disorders of Bursae and Tendons in Shoulder Region, Unspecified   PROCEDURE:  Procedure(s): RIGHT SHOULDER ARTHROSCOPY WITH EXTENSIVE DEBRIDEMENT OF ROTATOR CUFF AND LABRUM, SUBACROMIAL DECOMPRESSION, PARTIAL ACROMIOPLASTY WITH CORACOACROMIAL RELEASE (Right)  SURGEON:  Surgeon(s) and Role:    * W D Carloyn Manner., MD - Primary  PHYSICIAN ASSISTANT:   ASSISTANTS:   ANESTHESIA:   regional and general  EBL:  Total I/O In: 500 [I.V.:500] Out: -   BLOOD ADMINISTERED:none  DRAINS: none   LOCAL MEDICATIONS USED:  NONE  SPECIMEN:  No Specimen  DISPOSITION OF SPECIMEN:  N/A  COUNTS:  YES  TOURNIQUET:  * No tourniquets in log *  DICTATION: .Other Dictation: Dictation Number unknown  PLAN OF CARE: Discharge to home after PACU  PATIENT DISPOSITION:  PACU - hemodynamically stable.   Delay start of Pharmacological VTE agent (>24hrs) due to surgical blood loss or risk of bleeding: not applicable

## 2013-07-02 NOTE — Anesthesia Preprocedure Evaluation (Addendum)
Anesthesia Evaluation  Patient identified by MRN, date of birth, ID band Patient awake    Reviewed: Allergy & Precautions, H&P , NPO status , Patient's Chart, lab work & pertinent test results  Airway Mallampati: I TM Distance: >3 FB Neck ROM: Full    Dental no notable dental hx. (+) Teeth Intact, Dental Advisory Given   Pulmonary sleep apnea and Continuous Positive Airway Pressure Ventilation , Current Smoker,  breath sounds clear to auscultation  Pulmonary exam normal       Cardiovascular hypertension, On Medications Rhythm:Regular Rate:Normal     Neuro/Psych negative neurological ROS  negative psych ROS   GI/Hepatic negative GI ROS, Neg liver ROS,   Endo/Other  diabetes, Well Controlled, Type 2, Oral Hypoglycemic AgentsMorbid obesity  Renal/GU negative Renal ROS  negative genitourinary   Musculoskeletal   Abdominal   Peds  Hematology negative hematology ROS (+)   Anesthesia Other Findings   Reproductive/Obstetrics negative OB ROS                          Anesthesia Physical Anesthesia Plan  ASA: III  Anesthesia Plan: General and Regional   Post-op Pain Management:    Induction: Intravenous  Airway Management Planned: Oral ETT  Additional Equipment:   Intra-op Plan:   Post-operative Plan: Extubation in OR  Informed Consent: I have reviewed the patients History and Physical, chart, labs and discussed the procedure including the risks, benefits and alternatives for the proposed anesthesia with the patient or authorized representative who has indicated his/her understanding and acceptance.   Dental advisory given  Plan Discussed with: CRNA  Anesthesia Plan Comments:         Anesthesia Quick Evaluation

## 2013-07-02 NOTE — Anesthesia Procedure Notes (Addendum)
Anesthesia Regional Block:  Interscalene brachial plexus block  Pre-Anesthetic Checklist: ,, timeout performed, Correct Patient, Correct Site, Correct Laterality, Correct Procedure, Correct Position, site marked, Risks and benefits discussed, pre-op evaluation,  At surgeon's request and post-op pain management  Laterality: Right  Prep: Maximum Sterile Barrier Precautions used and chloraprep       Needles:  Injection technique: Single-shot  Needle Type: Echogenic Stimulator Needle     Needle Length: 5cm 5 cm Needle Gauge: 22 and 22 G    Additional Needles:  Procedures: ultrasound guided (picture in chart) and nerve stimulator Interscalene brachial plexus block  Nerve Stimulator or Paresthesia:  Response: Biceps response,   Additional Responses:   Narrative:  Start time: 07/02/2013 8:54 AM End time: 07/02/2013 9:09 AM Injection made incrementally with aspirations every 5 mL. Anesthesiologist: Sampson Goon, MD  Additional Notes: 2% Lidocaine skin wheel.    Procedure Name: Intubation Date/Time: 07/02/2013 9:44 AM Performed by: Canovanas Desanctis Pre-anesthesia Checklist: Patient identified, Emergency Drugs available, Suction available, Patient being monitored and Timeout performed Patient Re-evaluated:Patient Re-evaluated prior to inductionOxygen Delivery Method: Circle System Utilized Preoxygenation: Pre-oxygenation with 100% oxygen Intubation Type: IV induction Ventilation: Mask ventilation without difficulty Laryngoscope Size: Miller and 3 Grade View: Grade III Tube type: Oral Tube size: 8.0 mm Number of attempts: 1 Airway Equipment and Method: stylet,  oral airway and Patient positioned with wedge pillow Placement Confirmation: ETT inserted through vocal cords under direct vision,  positive ETCO2 and breath sounds checked- equal and bilateral Secured at: 24 cm Tube secured with: Tape Dental Injury: Teeth and Oropharynx as per pre-operative assessment

## 2013-07-02 NOTE — Anesthesia Postprocedure Evaluation (Signed)
  Anesthesia Post-op Note  Patient: Christian Bowen  Procedure(s) Performed: Procedure(s): RIGHT SHOULDER ARTHROSCOPY WITH EXTENSIVE DEBRIDEMENT OF ROTATOR CUFF AND LABRUM, SUBACROMIAL DECOMPRESSION, PARTIAL ACROMIOPLASTY WITH CORACOACROMIAL RELEASE (Right)  Patient Location: PACU  Anesthesia Type:General and block  Level of Consciousness: awake and alert   Airway and Oxygen Therapy: Patient Spontanous Breathing  Post-op Pain: none  Post-op Assessment: Post-op Vital signs reviewed, Patient's Cardiovascular Status Stable and Respiratory Function Stable  Post-op Vital Signs: Reviewed  Filed Vitals:   07/02/13 1145  BP: 142/83  Pulse: 104  Temp:   Resp: 19    Complications: No apparent anesthesia complications

## 2013-07-02 NOTE — Progress Notes (Signed)
chl opt Assisted Dr. Sampson Goon with right, ultrasound guided, interscalene  block. Side rails up, monitors on throughout procedure. See vital signs in flow sheet. Tolerated Procedure well.

## 2013-07-02 NOTE — Progress Notes (Signed)
Dr. Sampson Goon in to view O2 sats .  Dr. Sampson Goon would like for Dr. Madelon Lips to be notified of sats and for possible overnight hospital stay.  Dr. Candise Bowens PA notified of pt status and order will be placed for pt to stay overnight.

## 2013-07-02 NOTE — Telephone Encounter (Signed)
I called to speak with patient-was told by male that answered the phone that Christian Bowen is currently in surgery. I requested that she let Christian Bowen know that I called and to call back as he is able to discuss his CPAP concerns/ results.    Download showed patient is not using his CPAP enough. I have download paper in my holding file on my desk.

## 2013-07-02 NOTE — Transfer of Care (Signed)
Immediate Anesthesia Transfer of Care Note  Patient: Christian Bowen  Procedure(s) Performed: Procedure(s): RIGHT SHOULDER ARTHROSCOPY WITH EXTENSIVE DEBRIDEMENT OF ROTATOR CUFF AND LABRUM, SUBACROMIAL DECOMPRESSION, PARTIAL ACROMIOPLASTY WITH CORACOACROMIAL RELEASE (Right)  Patient Location: PACU  Anesthesia Type:GA combined with regional for post-op pain  Level of Consciousness: awake, alert , oriented and patient cooperative  Airway & Oxygen Therapy: Patient Spontanous Breathing and Patient connected to face mask oxygen  Post-op Assessment: Report given to PACU RN and Post -op Vital signs reviewed and stable  Post vital signs: Reviewed and stable  Complications: No apparent anesthesia complications

## 2013-07-03 DIAGNOSIS — S43439A Superior glenoid labrum lesion of unspecified shoulder, initial encounter: Secondary | ICD-10-CM | POA: Diagnosis not present

## 2013-07-03 MED ORDER — CEFAZOLIN SODIUM-DEXTROSE 2-3 GM-% IV SOLR
INTRAVENOUS | Status: AC
  Filled 2013-07-03: qty 50

## 2013-07-03 NOTE — Op Note (Deleted)
NAME:  Christian Bowen, Christian Bowen             ACCOUNT NO.:  [id number]  MEDICAL RECORD NO.:  [id number]  LOCATION:                               FACILITY:  [facility]  PHYSICIAN:  W. D. Zeyna Mkrtchyan, M.D.    DATE OF BIRTH:  01/05/1961  DATE OF PROCEDURE:  07/03/2013 DATE OF DISCHARGE:  07/03/2013                              OPERATIVE REPORT   PREOPERATIVE DIAGNOSIS:  Impingement.  POSTOPERATIVE DIAGNOSIS: 1. Degenerative tearing of anterior superior labrum. 2. Partial rotator cuff tear. 3. Impingement.  OPERATION: 1. Arthroscopic debridement torn labrum and rotator cuff (extensive) 2. Arthroscopic acromioplasty all for the involved right shoulder.  SURGEON:  W. D. Gilmore List, M.D.  ANESTHESIA:  General with a nerve block.  INDICATIONS:  This is a 52-year-old with recurrent impingement of right shoulder pain, did not respond to conservative treatment including injection __________ outpatient surgery.  DESCRIPTION OF PROCEDURE:  Beach chair positioning.  Examination under anesthesia showed normal range of motion.  No instability.  Arthroscoped the posterolateral and anterior portal on the right shoulder. Systematic inspection of the shoulder showed significant degenerative tearing of the anterosuperior labrum with pieces of the labrum that would presumably catch in the joint, we aggressively debrided this. Biceps tendon anchor intact.  Full width partial-thickness tear of the supraspinatus estimated about 20% to 30%, which was debrided as well.  Subacromial space was inflamed, hypertrophied with a type 3 acromion. We had an abrasion phenomenon consistent with impingement with periosteum reaction and some abrasion of the cup, but nothing approaching the full-thickness tear.  Performed bursectomy, acromioplasty.  AC joint was minimally degenerative on the studies and clinically.  I elected not to do a distal clavicle excision.  Bursectomy carried out.  Shoulder  drained free of fluid.  Portals  closed with nylon, placed in a sling and sterile dressing, taken to the recovery room in stable condition.    W. D. Oriana Horiuchi, M.D.    WDC/MEDQ  D:  07/02/2013  T:  07/03/2013  Job:  [id number] 

## 2013-07-03 NOTE — Op Note (Signed)
NAMEMarland Kitchen  SHAUNA, VARELAS NO.:  [id number]  MEDICAL RECORD NO.:  [id number]  LOCATION:                               FACILITY:  [facility]  PHYSICIAN:  Dyke Brackett, M.D.    DATE OF BIRTH:  1960/10/24  DATE OF PROCEDURE:  07/03/2013 DATE OF DISCHARGE:  07/03/2013                              OPERATIVE REPORT   PREOPERATIVE DIAGNOSIS:  Impingement.  POSTOPERATIVE DIAGNOSIS: 1. Degenerative tearing of anterior superior labrum. 2. Partial rotator cuff tear. 3. Impingement.  OPERATION: 1. Arthroscopic debridement torn labrum and rotator cuff (extensive) 2. Arthroscopic acromioplasty all for the involved right shoulder.  SURGEON:  Dyke Brackett, M.D.  ANESTHESIA:  General with a nerve block.  INDICATIONS:  This is a 53 year old with recurrent impingement of right shoulder pain, did not respond to conservative treatment including injection __________ outpatient surgery.  DESCRIPTION OF PROCEDURE:  Beach chair positioning.  Examination under anesthesia showed normal range of motion.  No instability.  Arthroscoped the posterolateral and anterior portal on the right shoulder. Systematic inspection of the shoulder showed significant degenerative tearing of the anterosuperior labrum with pieces of the labrum that would presumably catch in the joint, we aggressively debrided this. Biceps tendon anchor intact.  Full width partial-thickness tear of the supraspinatus estimated about 20% to 30%, which was debrided as well.  Subacromial space was inflamed, hypertrophied with a type 3 acromion. We had an abrasion phenomenon consistent with impingement with periosteum reaction and some abrasion of the cup, but nothing approaching the full-thickness tear.  Performed bursectomy, acromioplasty.  AC joint was minimally degenerative on the studies and clinically.  I elected not to do a distal clavicle excision.  Bursectomy carried out.  Shoulder  drained free of fluid.  Portals  closed with nylon, placed in a sling and sterile dressing, taken to the recovery room in stable condition.    Dyke Brackett, M.D.    WDC/MEDQ  D:  07/02/2013  T:  07/03/2013  Job:  [id number]

## 2013-07-05 ENCOUNTER — Encounter (HOSPITAL_BASED_OUTPATIENT_CLINIC_OR_DEPARTMENT_OTHER): Payer: Self-pay | Admitting: Orthopedic Surgery

## 2013-07-07 NOTE — Telephone Encounter (Signed)
Ask [employer] for download from recent autotitration, so we can set pressure.

## 2013-07-07 NOTE — Telephone Encounter (Signed)
Spoke with the pt  He states he feels that the CPAP pressure is set too low  He is snoring and just feels that it is not giving him enough air  He admits to not using machine every night b/c of this  Please advise, thanks!

## 2013-07-07 NOTE — Telephone Encounter (Signed)
ATC pt -- cont ring -- NA WCB

## 2013-07-07 NOTE — Telephone Encounter (Signed)
Pt returning call a/b setting for cpap machine says that he has dentist appt but can still call.Caren Griffins

## 2013-07-07 NOTE — Telephone Encounter (Signed)
Called [employer] and spoke with Shereece and requested download to be faxed  Will await results

## 2013-07-08 NOTE — Telephone Encounter (Signed)
Results attached to phone note for CY to review and advise.

## 2013-07-08 NOTE — Telephone Encounter (Signed)
Nothing in triage so will forward to [facility] to f/u on thanks

## 2013-07-09 ENCOUNTER — Telehealth: Payer: Self-pay

## 2013-07-09 DIAGNOSIS — G4733 Obstructive sleep apnea (adult) (pediatric): Secondary | ICD-10-CM

## 2013-07-09 NOTE — Telephone Encounter (Signed)
Pt aware of change in pressure and to increase the use of CPAP. Nothing more needed at this time.

## 2013-07-09 NOTE — Telephone Encounter (Signed)
We are changing CPAP to 16, encourage more regular use all night, every night

## 2013-07-09 NOTE — Telephone Encounter (Signed)
Per CY, we need to set CPAP to 16 and increase use. Order placed. lmtcb X1 to make pt aware.

## 2013-07-09 NOTE — Telephone Encounter (Signed)
Pt aware of change in pressure and to increase the use of CPAP. Nothing more needed at this time.  

## 2013-07-17 ENCOUNTER — Emergency Department (INDEPENDENT_AMBULATORY_CARE_PROVIDER_SITE_OTHER)
Admission: EM | Admit: 2013-07-17 | Discharge: 2013-07-17 | Disposition: A | Discharge status: Home / Self Care | Payer: Medicare Other | Source: Home / Self Care

## 2013-07-17 ENCOUNTER — Emergency Department (INDEPENDENT_AMBULATORY_CARE_PROVIDER_SITE_OTHER): Payer: Medicare Other

## 2013-07-17 DIAGNOSIS — S40019A Contusion of unspecified shoulder, initial encounter: Secondary | ICD-10-CM

## 2013-07-17 DIAGNOSIS — W108XXA Fall (on) (from) other stairs and steps, initial encounter: Secondary | ICD-10-CM

## 2013-07-17 MED ORDER — IBUPROFEN 800 MG PO TABS
800.0000 mg | ORAL_TABLET | Freq: Once | ORAL | Status: AC
  Administered 2013-07-17: 800 mg via ORAL

## 2013-07-17 MED ORDER — IBUPROFEN 800 MG PO TABS
ORAL_TABLET | ORAL | Status: AC
  Filled 2013-07-17: qty 1

## 2013-07-17 MED ORDER — OXYCODONE-ACETAMINOPHEN 10-325 MG PO TABS: 1.0000 | ORAL_TABLET | ORAL | Status: DC | PRN

## 2013-07-17 NOTE — ED Provider Notes (Signed)
Chief Complaint   No chief complaint on file.   History of Present Illness   Christian Bowen is a [age] year old male who had right rotator cuff surgery about 3 weeks ago done by Dr. Madelon Lipsaffrey. This is healing up well, when he fell today going up some steps. He had a shoulder in a sling and was not able to stop his fall, thus he fell, striking his shoulder against the steps. There is no swelling, bruising, or deformity. He has pain over the shoulder and pain with movement. There is no pain radiating down into his arm, numbness, tingling, or weakness he did not hit his head and there was no loss of consciousness. He denies any other injuries except for some minor left hip pain, but he is able to ambulate.  Review of Systems   Other than as noted above, the patient denies any of the following symptoms: Systemic:  No fevers or chills. Musculoskeletal:  No joint pain, arthritis, swelling, back pain, or neck pain. No history of arthritis.  Neurological:  No muscular weakness or paresthesia.  PMFSH   Past medical history, family history, social history, meds, and allergies were reviewed.  He has hypertension, diabetes, asthma, obesity, and sleep apnea. Current medications include albuterol, lisinopril/or thiazide, Janumet, Lipitor, Breo Ellipta, Mobic, Robaxin, and Deltasone.  Physical Examination     Vital signs:  BP 128/98  Pulse 123  Temp(Src) 99.5 F (37.5 C) (Oral)  Resp 20  SpO2 100% Gen:  Alert and oriented times 3.  In no distress. Musculoskeletal: He has some healing surgical scars. There is no bruising, swelling, or deformity. There is pain to palpation over the entire shoulder. He has a limited range of motion, but it's hard to say what his baseline was prior to the injury and there is pain with movement. Muscle strength is normal. Pulses are full. He has good capillary refill and sensation. Neer test was positive.  Hawkins test was negative.  Empty cans test could not be done.  Otherwise, all joints had a full a ROM with no swelling, bruising or deformity.  No edema, pulses full. Extremities were warm and pink.  Capillary refill was brisk.  Skin:  Clear, warm and dry.  No rash. Neuro:  Alert and oriented times 3.  Muscle strength was normal.  Sensation was intact to light touch.   Radiology   Dg Shoulder Right  07/17/2013   CLINICAL DATA:  Fall, right shoulder pain.  EXAM: RIGHT SHOULDER - 2+ VIEW  COMPARISON:  None.  FINDINGS: Degenerative changes in the right [facility]. Mild subacromial and subclavicular spurring. Splenic femur joint is intact. No acute bony abnormality. Specifically, no fracture, subluxation, or dislocation. Soft tissues are intact.  IMPRESSION: No acute bony abnormality.  Osteoarthritis right AC joint.   Electronically Signed   By: Charlett NoseKevin  Dover M.D.   On: 07/17/2013 14:57   I reviewed the images independently and personally and concur with the radiologist's findings.  Assessment   The encounter diagnosis was Shoulder contusion.  Here contused his rotator cuff or this possibly could report it. I suggest he followup with his orthopedist next week in the meantime start icing and rest this weekend. He can continue on with a sling, but I suggested some wall climbs in pendulum exercises.  Plan     1.  Meds:  The following meds were prescribed:   Discharge Medication List as of 07/17/2013  3:17 PM    START taking these medications   Details  oxyCODONE-acetaminophen (PERCOCET) 10-325 MG per tablet Take 1 tablet by mouth every 4 (four) hours as needed for pain., Starting 07/17/2013, Until Discontinued, Print        2.  Patient Education/Counseling:  The patient was given appropriate handouts, self care instructions, and instructed in symptomatic relief.    3.  Follow up:  The patient was told to follow up here if no better in 3 to 4 days, or sooner if becoming worse in any way, and given some red flag symptoms such as worsening pain or new  neurological symptoms which would prompt immediate return.  Follow up with Dr. Madelon Lipsaffrey next week.     Christian Likesavid C Khia Dieterich, MD 07/17/13 [phone]

## 2013-07-17 NOTE — Discharge Instructions (Signed)

## 2013-07-19 ENCOUNTER — Telehealth: Payer: Self-pay | Admitting: Internal Medicine

## 2013-07-19 MED ORDER — PREDNISONE 10 MG PO TABS: ORAL_TABLET | ORAL | Status: DC

## 2013-07-19 NOTE — Telephone Encounter (Signed)
Per CY-lets give Prednisone 10 mg #20 take 4 x 2 days, 3 x 2 days, 2 x 2 days,1 x 2 days, then stop no refills.

## 2013-07-19 NOTE — Telephone Encounter (Signed)
pred 8 day taper sent to pt pharmacy [facility] [address] Pt aware Nothing further needed.

## 2013-07-19 NOTE — Telephone Encounter (Signed)
Pt c/o incr tightness in chest, chest congestion and SOB x 3 days Using daily inhalers and nebs as directed-- no relief Using both the albuterol HFA and nebulizers frequently x 3 days Pt states that Prednisone usually works to help clear this up. Pt reports worse since increased heat outside.  [facility] [address] Allergies  Allergen Reactions  . Hydromorphone Hcl     Patient went into respiratory distress   Please advise Dr Maple HudsonYoung. thanks

## 2013-07-29 ENCOUNTER — Emergency Department (INDEPENDENT_AMBULATORY_CARE_PROVIDER_SITE_OTHER)
Admission: EM | Admit: 2013-07-29 | Discharge: 2013-07-29 | Disposition: A | Discharge status: Home / Self Care | Payer: Medicare Other | Source: Home / Self Care | Attending: Family Medicine | Admitting: Family Medicine

## 2013-07-29 ENCOUNTER — Encounter (HOSPITAL_COMMUNITY): Payer: Self-pay | Admitting: Emergency Medicine

## 2013-07-29 DIAGNOSIS — M25511 Pain in right shoulder: Secondary | ICD-10-CM

## 2013-07-29 DIAGNOSIS — M25519 Pain in unspecified shoulder: Secondary | ICD-10-CM

## 2013-07-29 MED ORDER — OXYCODONE-ACETAMINOPHEN 5-325 MG PO TABS: 1.0000 | ORAL_TABLET | Freq: Three times a day (TID) | ORAL | Status: DC | PRN

## 2013-07-29 NOTE — Discharge Instructions (Signed)
Thank you for coming in today. Please follow up with Dr. Madelon Lipsaffrey sooner than June 6th.  You will need to see the orthopedic group for further pain medication.   Shoulder Pain The shoulder is the joint that connects your arms to your body. The bones that form the shoulder joint include the upper arm bone (humerus), the shoulder blade (scapula), and the collarbone (clavicle). The top of the humerus is shaped like a ball and fits into a rather flat socket on the scapula (glenoid cavity). A combination of muscles and strong, fibrous tissues that connect muscles to bones (tendons) support your shoulder joint and hold the ball in the socket. Small, fluid-filled sacs (bursae) are located in different areas of the joint. They act as cushions between the bones and the overlying soft tissues and help reduce friction between the gliding tendons and the bone as you move your arm. Your shoulder joint allows a wide range of motion in your arm. This range of motion allows you to do things like scratch your back or throw a ball. However, this range of motion also makes your shoulder more prone to pain from overuse and injury. Causes of shoulder pain can originate from both injury and overuse and usually can be grouped in the following four categories:  Redness, swelling, and pain (inflammation) of the tendon (tendinitis) or the bursae (bursitis).  Instability, such as a dislocation of the joint.  Inflammation of the joint (arthritis).  Broken bone (fracture). HOME CARE INSTRUCTIONS   Apply ice to the sore area.  Put ice in a plastic bag.  Place a towel between your skin and the bag.  Leave the ice on for 15-20 minutes, 3-4 times per day for the first 2 days, or as directed by your health care provider.  Stop using cold packs if they do not help with the pain.  If you have a shoulder sling or immobilizer, wear it as long as your caregiver instructs. Only remove it to shower or bathe. Move your arm as  little as possible, but keep your hand moving to prevent swelling.  Squeeze a soft ball or foam pad as much as possible to help prevent swelling.  Only take over-the-counter or prescription medicines for pain, discomfort, or fever as directed by your caregiver. SEEK MEDICAL CARE IF:   Your shoulder pain increases, or new pain develops in your arm, hand, or fingers.  Your hand or fingers become cold and numb.  Your pain is not relieved with medicines. SEEK IMMEDIATE MEDICAL CARE IF:   Your arm, hand, or fingers are numb or tingling.  Your arm, hand, or fingers are significantly swollen or turn white or blue. MAKE SURE YOU:   Understand these instructions.  Will watch your condition.  Will get help right away if you are not doing well or get worse. Document Released: 10/31/2004 Document Revised: 01/26/2013 Document Reviewed: 01/05/2011 [facility] Patient Information 2015 [employer] [place]. This information is not intended to replace advice given to you by your health care provider. Make sure you discuss any questions you have with your health care provider.

## 2013-07-29 NOTE — ED Provider Notes (Signed)
Christian Bowen is a 53 y.o. male who presents to Urgent Care today for right shoulder pain. Patient fell landing on his right shoulder about a one week ago. He was seen in the clinic at that time x-rays are normal. He is here today for refill of his pain medications. He ran out about 2 days ago. The pain returned when the medication ran out. He denies any new injury. He denies any fevers or chills. He has not been seen by his orthopedic surgeon. He had rotator cuff surgery about 4 weeks ago by Dr. Madelon Lipsaffrey.   Past Medical History  Diagnosis Date  . Hypertension   . Diabetes mellitus   . Asthma   . Obesity   . Sleep apnea     CPAP-has had UPPP-tonsils and turb red  . Apnea, sleep     was tested 2005   History  Substance Use Topics  . Smoking status: Current Every Day Smoker -- 0.25 packs/day for 30 years  . Smokeless tobacco: Never Used     Comment: 1 pack will last him 3-4 weeks  . Alcohol Use: Yes     Comment: 1 pint over the weekend...its not all the time   ROS as above Medications: No current facility-administered medications for this encounter.   Current Outpatient Prescriptions  Medication Sig Dispense Refill  . albuterol (PROVENTIL HFA;VENTOLIN HFA) 108 (90 BASE) MCG/ACT inhaler Inhale 2 puffs into the lungs every 6 (six) hours as needed for wheezing or shortness of breath.       Marland Kitchen. albuterol (PROVENTIL) (2.5 MG/3ML) 0.083% nebulizer solution Take 3 mLs (2.5 mg total) by nebulization every 6 (six) hours as needed for wheezing or shortness of breath.  75 vial  prn  . atorvastatin (LIPITOR) 40 MG tablet Take 40 mg by mouth daily.      . Fluticasone Furoate-Vilanterol (BREO ELLIPTA) 100-25 MCG/INH AEPB Inhale 1 puff then rinse mouth, once daily -maintenance  1 each  prn  . glimepiride (AMARYL) 4 MG tablet Take 4 mg by mouth daily with breakfast.      . lisinopril-hydrochlorothiazide (PRINZIDE,ZESTORETIC) 20-25 MG per tablet Take 1 tablet by mouth daily.      Marland Kitchen.  oxyCODONE-acetaminophen (PERCOCET/ROXICET) 5-325 MG per tablet Take 1 tablet by mouth every 8 (eight) hours as needed for severe pain.  15 tablet  0  . predniSONE (DELTASONE) 10 MG tablet Take 4 tabs po x 2 days, then 3 x 2 days, then 2 x 2 days, then 1 x 2 days then stop.  20 tablet  0  . sitaGLIPtan-metformin (JANUMET) 50-1000 MG per tablet Take 1 tablet by mouth 2 (two) times daily with a meal.      . traMADol (ULTRAM) 50 MG tablet Take by mouth every 6 (six) hours as needed.        Exam:  BP 135/91  Pulse 96  Temp(Src) 98.2 F (36.8 C) (Oral)  Resp 20  SpO2 100% Gen: Well NAD Right shoulder: In a sling.  Healing surgical scars no erythema or exudate. No ecchymosis. Mildly tender along the posterior scapula. Did not test range of motion given post surgical state. Capillary refill pulses and sensation and grip strength are intact distally.  Assessment and Plan: 53 y.o. male with shoulder pain following fall and surgery. Limited amount of pain medications. Followup with orthopedic surgeon for more pain medications.  Discussed warning signs or symptoms. Please see discharge instructions. Patient expresses understanding.    Rodolph BongEvan S Corey, MD 07/29/13 [phone]

## 2013-07-29 NOTE — ED Notes (Signed)
Here for follow up on right shoulder pain/arm pain States he ran out of pain medication

## 2013-08-12 ENCOUNTER — Telehealth: Payer: Self-pay | Admitting: Family Medicine

## 2013-08-12 NOTE — Telephone Encounter (Signed)
This is a new pt to the practice and I am having to reschedule due to Dr Selena BattenKim being off the week of 09/17/13 he has medicare which means it would be 2016 before pt can get in. Since Dr Selena BattenKim is having to have this pt reschedule can I just put him in a new pt slot even though he has [employer] medicare

## 2013-08-12 NOTE — Telephone Encounter (Signed)
Yes, that is ok 

## 2013-09-02 ENCOUNTER — Telehealth: Payer: Self-pay | Admitting: Internal Medicine

## 2013-09-02 NOTE — Telephone Encounter (Signed)
Called spoke [facility]ise [facility]. RT is currently out but she does show they received order for pt CPAP to be increased to 16 cm per June order. She will call me back with an update  Pt is aware of this as well

## 2013-09-06 NOTE — Telephone Encounter (Signed)
LM with answering service -- all reps are in meeting until 10am Will await call back

## 2013-09-07 NOTE — Telephone Encounter (Signed)
[facility] and she states they are going to call the pt today and have his pressure changed. She could not answer why it has not been done since order was send in June. Pt aware. Carron CurieJennifer Castillo, CMA

## 2013-09-13 ENCOUNTER — Ambulatory Visit: Payer: Medicare Other | Admitting: Family Medicine

## 2013-10-06 NOTE — Telephone Encounter (Signed)
Pt scheduled  

## 2013-10-07 ENCOUNTER — Ambulatory Visit: Payer: Medicare Other | Admitting: Internal Medicine

## 2013-10-12 ENCOUNTER — Telehealth: Payer: Self-pay | Admitting: Internal Medicine

## 2013-10-12 ENCOUNTER — Ambulatory Visit (INDEPENDENT_AMBULATORY_CARE_PROVIDER_SITE_OTHER): Payer: Medicare Other | Admitting: Internal Medicine

## 2013-10-12 ENCOUNTER — Encounter: Payer: Self-pay | Admitting: Internal Medicine

## 2013-10-12 ENCOUNTER — Encounter: Payer: Self-pay | Admitting: Family Medicine

## 2013-10-12 ENCOUNTER — Encounter: Payer: Medicare Other | Admitting: Family Medicine

## 2013-10-12 VITALS — BP 142/82 | HR 91 | Ht 72.0 in | Wt 314.6 lb

## 2013-10-12 DIAGNOSIS — IMO0002 Reserved for concepts with insufficient information to code with codable children: Secondary | ICD-10-CM

## 2013-10-12 DIAGNOSIS — F17200 Nicotine dependence, unspecified, uncomplicated: Secondary | ICD-10-CM

## 2013-10-12 DIAGNOSIS — Z720 Tobacco use: Secondary | ICD-10-CM

## 2013-10-12 DIAGNOSIS — G4733 Obstructive sleep apnea (adult) (pediatric): Secondary | ICD-10-CM

## 2013-10-12 DIAGNOSIS — E1351 Other specified diabetes mellitus with diabetic peripheral angiopathy without gangrene: Secondary | ICD-10-CM | POA: Insufficient documentation

## 2013-10-12 DIAGNOSIS — E1365 Other specified diabetes mellitus with hyperglycemia: Secondary | ICD-10-CM | POA: Insufficient documentation

## 2013-10-12 DIAGNOSIS — M25519 Pain in unspecified shoulder: Secondary | ICD-10-CM

## 2013-10-12 DIAGNOSIS — I798 Other disorders of arteries, arterioles and capillaries in diseases classified elsewhere: Secondary | ICD-10-CM

## 2013-10-12 DIAGNOSIS — I10 Essential (primary) hypertension: Secondary | ICD-10-CM | POA: Insufficient documentation

## 2013-10-12 DIAGNOSIS — J4541 Moderate persistent asthma with (acute) exacerbation: Secondary | ICD-10-CM

## 2013-10-12 DIAGNOSIS — E785 Hyperlipidemia, unspecified: Secondary | ICD-10-CM

## 2013-10-12 DIAGNOSIS — J45901 Unspecified asthma with (acute) exacerbation: Secondary | ICD-10-CM

## 2013-10-12 HISTORY — DX: Hyperlipidemia, unspecified: E78.5

## 2013-10-12 HISTORY — DX: Pain in unspecified shoulder: M25.519

## 2013-10-12 MED ORDER — PREDNISONE 10 MG PO TABS: ORAL_TABLET | ORAL | Status: DC

## 2013-10-12 MED ORDER — FLUTICASONE FUROATE-VILANTEROL 100-25 MCG/INH IN AEPB: INHALATION_SPRAY | RESPIRATORY_TRACT | Status: DC

## 2013-10-12 NOTE — Assessment & Plan Note (Signed)
I am not sure he is ever free of wheeze, but he should do better if he will stick to a maintenance inhaler. We will restart his Breo. Discussed potential importance of lisinopril although he is wheezing more than coughing and has not had angioedema or hives

## 2013-10-12 NOTE — Assessment & Plan Note (Signed)
Offered support and emphasized importance of quitting

## 2013-10-12 NOTE — Assessment & Plan Note (Signed)
Discussed prednisone interaction with his diabetes

## 2013-10-12 NOTE — Assessment & Plan Note (Signed)
Good compliance and control. Weight loss would help.  

## 2013-10-12 NOTE — Progress Notes (Signed)
error    This encounter was created in error - please disregard.

## 2013-10-12 NOTE — Assessment & Plan Note (Signed)
Encouraged weight loss 

## 2013-10-12 NOTE — Progress Notes (Signed)
02/16/13- 57 yoM smoker referred courtesy of Dr Windle Guard-  c/o snores alot,does not sleep well,has had CPAP 2005 off for 5 yrs. after moving,back on x 1 yr. Now, pressure. not good- too weak NPSG done 10 years ago- searching paper chart. 09/23/11- Dr Marvetta Gibbons- Surgery- UPPP/ turbinate reduction/ septoplasty. Has gained a great deal of weight since original sleep study. Bedtime 1 AM, sleep latency 60-90 minutes, awakening 3 or 4 times during the night before up at 7:30 AM. Thinks current CPAP pressure may be around 8. Asthma treated with albuterol nebulizer. Continues to smoke one quarter pack per day. Now has an incidental chest cold with some wheeze for the past week. Hypertension without cardiac disease.  05/04/13- 52 yoM smoker followed for OSA, s/p UPPP, tobacco abuse NPSG done 10 years ago follows for:   pt had his PFT done today.  here to review the results.  pt brought his cpap in today. Admits occasional cigarette. Counseled again. Continues CPAP 8 (?)/[facility]. Going to gym now trying to lose weight CXR 03/26/13 IMPRESSION:  No active disease.  Electronically Signed  By: Irish Lack M.D.  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 FVC 3.52/78%, FEV1 2.56/72%, FEV1/FVC 0.73/91%, FEF 25-75% 1.74/51%. TLC 73%, DLCO 108%.  10/12/13-53 yoM smoker followed for OSA, s/p UPPP, tobacco abuse NPSG done 10 years ago Follows For: Chest congestion - Non prod cough - Chest tighness - SOB - Wheezing for past 2-3 weeks , flu shot 2 weeks ago. Has not had a cold but has been noticing persistent chest congestion, difficult to bring anything up. Using his nebulizer or inhaler. Says prednisone helped last visit. Not using Breo. CPAP 16/[facility]  ROS-see HPI Constitutional:   No-   weight loss, night sweats, fevers, chills, +fatigue, lassitude. HEENT:   No-  headaches, difficulty  swallowing, tooth/dental problems, sore throat,       No-  sneezing, itching, ear ache, nasal congestion, post nasal drip,  CV:  No-   chest pain, orthopnea, PND, swelling in lower extremities, anasarca,                                  dizziness, palpitations Resp: No-   shortness of breath with exertion or at rest.              No-   productive cough,  No non-productive cough,  No- coughing up of blood.              No-   change in color of mucus.  + wheezing.   Skin: No-   rash or lesions. GI:  No-   heartburn, indigestion, abdominal pain, nausea, vomiting,  GU:  MS:  No-   joint pain or swelling.    + back pain. Neuro-     nothing unusual Psych:  No- change in mood or affect. No depression or anxiety.  No memory loss.  OBJ- Physical Exam General- Alert, Oriented, Affect-appropriate, Distress- none acute, +obese Skin- rash-none, lesions- none, excoriation- none Lymphadenopathy- none Head- atraumatic            Eyes- Gross vision intact, PERRLA, conjunctivae and secretions clear            Ears- Hearing, canals-normal            Nose- Clear, no-Septal dev, mucus, polyps, erosion, perforation  Throat- Mallampati II/ UPPP , mucosa clear , drainage- none, tonsils- atrophic Neck- flexible , trachea midline, no stridor , thyroid nl, carotid no bruit Chest - symmetrical excursion , unlabored           Heart/CV- RRR , no murmur , no gallop  , no rub, nl s1 s2                           - JVD- none , edema- none, stasis changes- none, varices- none           Lung- , wheeze+Bilateral/ unlabored, cough- none , dullness-none, rub- none           Chest wall-  Abd-  Br/ Gen/ Rectal- Not done, not indicated Extrem- cyanosis- none, clubbing, none, atrophy- none, strength- nl Neuro- grossly intact to observation

## 2013-10-12 NOTE — Patient Instructions (Signed)
Script sent for prednisone taper  Sample and script(sent) for Breo Ellipta- 1 puff, then rinse mouth, one time every day

## 2013-10-12 NOTE — Telephone Encounter (Signed)
Called spoke w/ pt. appt scheduled to come in and see CDY today at 14. Nothing further needed

## 2013-11-03 ENCOUNTER — Emergency Department (INDEPENDENT_AMBULATORY_CARE_PROVIDER_SITE_OTHER)
Admission: EM | Admit: 2013-11-03 | Discharge: 2013-11-03 | Disposition: A | Discharge status: Home / Self Care | Payer: Medicare Other | Source: Home / Self Care | Attending: Emergency Medicine | Admitting: Emergency Medicine

## 2013-11-03 ENCOUNTER — Encounter (HOSPITAL_COMMUNITY): Payer: Self-pay | Admitting: Emergency Medicine

## 2013-11-03 DIAGNOSIS — W319XXA Contact with unspecified machinery, initial encounter: Secondary | ICD-10-CM

## 2013-11-03 DIAGNOSIS — S60212A Contusion of left wrist, initial encounter: Secondary | ICD-10-CM

## 2013-11-03 DIAGNOSIS — S60219A Contusion of unspecified wrist, initial encounter: Secondary | ICD-10-CM

## 2013-11-03 DIAGNOSIS — W208XXA Other cause of strike by thrown, projected or falling object, initial encounter: Secondary | ICD-10-CM

## 2013-11-03 MED ORDER — INDOMETHACIN 25 MG PO CAPS: 25.0000 mg | ORAL_CAPSULE | Freq: Three times a day (TID) | ORAL | Status: DC | PRN

## 2013-11-03 NOTE — ED Provider Notes (Signed)
CSN: [id number]     Arrival date & time 11/03/13  1853 History   None    Chief Complaint  Patient presents with  . Wrist Injury   (Consider location/radiation/quality/duration/timing/severity/associated sxs/prior Treatment) HPI Comments: Patient states he was working on his car on 10/30/2013 when a portion of the engine fell and landed on his left wrist/forearm area. Initially area was bruised and swollen with minor abrasion. He states bruising has resolved and swelling has improved, but still has some residual discomfort with ROM or wrist and forearm. States that the does not think he needs xrays because he feels that nothing is broken. He requests medication for pain management.  Reports tetanus booster within past 12 months.   Patient is a 53 y.o. male presenting with wrist injury. The history is provided by the patient.  Wrist Injury   Past Medical History  Diagnosis Date  . Hypertension   . Diabetes mellitus   . Asthma   . Obesity   . Sleep apnea     CPAP-has had UPPP-tonsils and turb red  . Apnea, sleep     was tested 2005  . Lumbar post-laminectomy syndrome 04/02/2013  . Tobacco abuse 03/13/2013  . Hyperlipemia 10/12/2013  . Pain in joint, shoulder region 10/12/2013   Past Surgical History  Procedure Laterality Date  . Heel spurs      bilateral feel  . Spinal cord stimulator implant  1997  . Back surgery  2009  . Nasal turbinate reduction  8/13    septoplasty-UPPP-tonsils  . Ganglion cyst excision  2/14-[facility]    lt wrist   . Tonsillectomy    . Dorsal compartment release Left 08/13/2012    Procedure: RELEASE DORSAL COMPARTMENT (DEQUERVAIN) and exploration of radial nerve branch;  Surgeon: Tami Ribas, MD;  Location: [facility];  Service: Orthopedics;  Laterality: Left;  . Ganglion cyst excision Left 08/13/2012    Procedure: REMOVAL GANGLION OF WRIST;  Surgeon: Tami Ribas, MD;  Location: [facility];  Service: Orthopedics;  Laterality: Left;  .  Spinal cord stimulator insertion N/A 04/02/2013    Procedure: LUMBAR SPINAL CORD STIMULATOR Insertion;  Surgeon: Gwynne Edinger, MD;  Location: [facility];  Service: Neurosurgery;  Laterality: N/A;  . Shoulder arthroscopy Right 07/02/2013    Procedure: RIGHT SHOULDER ARTHROSCOPY WITH EXTENSIVE DEBRIDEMENT OF ROTATOR CUFF AND LABRUM, SUBACROMIAL DECOMPRESSION, PARTIAL ACROMIOPLASTY WITH CORACOACROMIAL RELEASE;  Surgeon: Thera Flake., MD;  Location: [facility];  Service: Orthopedics;  Laterality: Right;   Family History  Problem Relation Age of Onset  . Hypertension Mother   . Heart attack Father    History  Substance Use Topics  . Smoking status: Current Some Day Smoker -- 0.25 packs/day for 30 years    Types: Cigarettes  . Smokeless tobacco: Never Used     Comment: 1 pack will last him 3-4 weeks  . Alcohol Use: Yes     Comment: 1 pint over the weekend...its not all the time    Review of Systems  All other systems reviewed and are negative.   Allergies  Hydromorphone hcl  Home Medications   Prior to Admission medications   Medication Sig Start Date End Date Taking? Authorizing Provider  acetaminophen (TYLENOL) 500 MG tablet Take 1,000 mg by mouth every 6 (six) hours as needed for moderate pain.   Yes Historical Provider, MD  albuterol (PROVENTIL HFA;VENTOLIN HFA) 108 (90 BASE) MCG/ACT inhaler Inhale 2 puffs into the lungs  every 6 (six) hours as needed for wheezing or shortness of breath.    Yes Historical Provider, MD  albuterol (PROVENTIL) (2.5 MG/3ML) 0.083% nebulizer solution Take 3 mLs (2.5 mg total) by nebulization every 6 (six) hours as needed for wheezing or shortness of breath. 06/21/13  Yes Waymon Budgelinton D Young, MD  atorvastatin (LIPITOR) 40 MG tablet Take 40 mg by mouth daily.   Yes Historical Provider, MD  Fluticasone Furoate-Vilanterol 100-25 MCG/INH AEPB 1 puff then rinse mouth, one time daily 10/12/13  Yes Clinton D Young, MD  glimepiride (AMARYL) 4 MG  tablet Take 4 mg by mouth daily with breakfast.   Yes Historical Provider, MD  ibuprofen (ADVIL,MOTRIN) 200 MG tablet Take 400 mg by mouth every 6 (six) hours as needed for moderate pain.   Yes Historical Provider, MD  lisinopril-hydrochlorothiazide (PRINZIDE,ZESTORETIC) 20-25 MG per tablet Take 1 tablet by mouth daily.   Yes Historical Provider, MD  sitaGLIPtan-metformin (JANUMET) 50-1000 MG per tablet Take 1 tablet by mouth 2 (two) times daily with a meal.   Yes Historical Provider, MD  indomethacin (INDOCIN) 25 MG capsule Take 1 capsule (25 mg total) by mouth 3 (three) times daily as needed for mild pain or moderate pain. 11/03/13   Mathis FareJennifer Lee H Allis Quirarte, PA  predniSONE (DELTASONE) 10 MG tablet 4 X 2 DAYS, 3 X 2 DAYS, 2 X 2 DAYS, 1 X 2 DAYS 10/12/13   Waymon Budgelinton D Young, MD   BP 150/87  Pulse 105  Temp(Src) 98 F (36.7 C)  Resp 20  SpO2 98% Physical Exam  Nursing note and vitals reviewed. Constitutional: He is oriented to person, place, and time. He appears well-developed and well-nourished. No distress.  HENT:  Head: Normocephalic and atraumatic.  Cardiovascular: Normal rate.   Pulmonary/Chest: Effort normal.  Musculoskeletal: Normal range of motion.       Left forearm: He exhibits tenderness and swelling. He exhibits no bony tenderness, no edema, no deformity and no laceration.       Arms: Neurological: He is alert and oriented to person, place, and time.  Skin: Skin is warm and dry.  Several small healing superficial abrasions at left wrist. No clinical signs of infection.   Psychiatric: He has a normal mood and affect. His behavior is normal.    ED Course  Procedures (including critical care time) Labs Review Labs Reviewed - No data to display  Imaging Review No results found.   MDM   1. Wrist contusion, left, initial encounter    RICE therapy and indocin as prescribed with follow up if no improvement over the next 1-2 weeks.    Ria ClockJennifer Lee H Jamari Moten, [place] 11/03/13  2018

## 2013-11-03 NOTE — Discharge Instructions (Signed)
Contusion °A contusion is a deep bruise. Contusions are the result of an injury that caused bleeding under the skin. The contusion may turn blue, purple, or yellow. Minor injuries will give you a painless contusion, but more severe contusions may stay painful and swollen for a few weeks.  °CAUSES  °A contusion is usually caused by a blow, trauma, or direct force to an area of the body. °SYMPTOMS  °· Swelling and redness of the injured area. °· Bruising of the injured area. °· Tenderness and soreness of the injured area. °· Pain. °DIAGNOSIS  °The diagnosis can be made by taking a history and physical exam. An X-ray, CT scan, or MRI may be needed to determine if there were any associated injuries, such as fractures. °TREATMENT  °Specific treatment will depend on what area of the body was injured. In general, the best treatment for a contusion is resting, icing, elevating, and applying cold compresses to the injured area. Over-the-counter medicines may also be recommended for pain control. Ask your caregiver what the best treatment is for your contusion. °HOME CARE INSTRUCTIONS  °· Put ice on the injured area. °¨ Put ice in a plastic bag. °¨ Place a towel between your skin and the bag. °¨ Leave the ice on for 15-20 minutes, 3-4 times a day, or as directed by your health care provider. °· Only take over-the-counter or prescription medicines for pain, discomfort, or fever as directed by your caregiver. Your caregiver may recommend avoiding anti-inflammatory medicines (aspirin, ibuprofen, and naproxen) for 48 hours because these medicines may increase bruising. °· Rest the injured area. °· If possible, elevate the injured area to reduce swelling. °SEEK IMMEDIATE MEDICAL CARE IF:  °· You have increased bruising or swelling. °· You have pain that is getting worse. °· Your swelling or pain is not relieved with medicines. °MAKE SURE YOU:  °· Understand these instructions. °· Will watch your condition. °· Will get help right  away if you are not doing well or get worse. °Document Released: 10/31/2004 Document Revised: 01/26/2013 Document Reviewed: 11/26/2010 °ExitCare® Patient Information ©2015 [employer]. This information is not intended to replace advice given to you by your health care provider. Make sure you discuss any questions you have with your health care provider. ° °

## 2013-11-03 NOTE — ED Provider Notes (Signed)
Medical screening examination/treatment/procedure(s) were performed by non-physician practitioner and as supervising physician I was immediately available for consultation/collaboration.  Dalayla Aldredge, M.D.  Dhara Schepp C Stylianos Stradling, MD 11/03/13 2159 

## 2013-11-03 NOTE — ED Notes (Addendum)
Taking transmission out a car and it fell off the jack onto his L wrist.  C/o pain and swelling to same.  He applied ice/heat on it and taking Tylenol and Advil.  Pain keeping him awake at night. Pain radiates to L elbow.  Multiple small healing abrasions and lacerations to L hand and wrist.

## 2013-11-23 ENCOUNTER — Ambulatory Visit: Payer: Medicare Other | Admitting: Internal Medicine

## 2013-12-02 NOTE — Op Note (Signed)
NAME:  Orpah GreekSHERRILL, Hager                  ACCOUNT NO.:  MEDICAL RECORD NO.:  [id number]  LOCATION:                                 FACILITY:  PHYSICIAN:  Dyke BrackettW. D. Krishan Mcbreen, M.D.    DATE OF BIRTH:  06-09-1960  DATE OF PROCEDURE:  07/02/2013 DATE OF DISCHARGE:                              OPERATIVE REPORT   THIS IS A RE-DICTATION.  INDICATIONS:  This patient is status post numerous conservative treatment.  He is a 53 year old with history of impingement, partial rotator cuff tear, severe pain not responding to conservative treatment including injection and physical therapy thought amenable to outpatient surgery.  PREOPERATIVE DIAGNOSIS:  Impingement.  POSTOPERATIVE DIAGNOSES: 1. Impingement in addition to degenerative tearing in anterior and     superior labrum. 2. Partial rotator cuff tear. 3. Impingement.  SURGEON:  Dyke BrackettW. D. Robi Mitter, M.D.  ANESTHESIA:  Stellate block.  DESCRIPTION OF PROCEDURE:  The patient was examined under anesthesia. He had no loss of range of motion.  He had no instability.  He was arthroscoped through standard posterolateral and anterior portals of the right shoulder.  The shoulder intra-articularly showed no evidence of glenohumeral degenerative change.  There was significant tearing of the anterior superior labrum degenerative in nature with loose pieces of labrum, which were aggressively debrided.  The biceps tendon anchor was intact.  The undersurface of the cup on the articular side showed a full width partial-thickness rotator cuff tear that was aggressively debrided, although there was no full-thickness component identified.  Subacromial space was inflamed and hypertrophied.  There was clinical evidence of impingement in the leading edge of the acromion and CA ligament.  We released the CA ligament, performed acromioplasty with the bur resecting about 7-8 mm of the anterior leading edge of the acromion. The superior surface of the cuff showed an  abrasion type phenomenon consistent with impingement, but no evidence of a full-thickness tear. Complete bursectomy was carried out.  The distal clavicle was not involved in the diseased process.  We elected not to do a distal clavicle excision. Shoulder was drained free of fluid.  Portals were closed with nylon, and the patient was taken to the recovery room in a stable condition in a sling.     Dyke BrackettW. D. Kamali Nephew, M.D.     WDC/MEDQ  D:  12/01/2013  T:  12/01/2013  Job:  [id number]

## 2013-12-23 NOTE — Op Note (Signed)
NAME:  [name], Christian                  ACCOUNT NO.:  MEDICAL RECORD NO.:  [id number]  LOCATION:                                 FACILITY:  PHYSICIAN:  Dyke BrackettW. D. Lexis Potenza, M.D.    DATE OF BIRTH:  1960-12-03  DATE OF PROCEDURE:  07/02/2013 DATE OF DISCHARGE:                              OPERATIVE REPORT   PREOPERATIVE DIAGNOSIS:  Impingement with degenerative tearing anterior superior labrum, partial rotator cuff tear.  POSTOPERATIVE DIAGNOSES:  Impingement with degenerative tearing anterior superior labrum, partial rotator cuff tear.  OPERATION: 1. Arthroscopic debridement, torn labrum rotator cuff (extensive). 2. Arthroscopic acromioplasty for the right shoulder.  SURGEON:  Dyke BrackettW. D. Torre Pikus, M.D.  ANESTHESIA:  General with block.  BLOOD LOSS:  Minimal.  INDICATIONS:  This is a 53 year old with recurrent impingement with shoulder pain not responding to conservative treatment including injection, therefore scheduled for outpatient surgery.  PROCEDURE:  Beach chair position.  Examination under anesthesia, normal range of motion.  No instability.  Arthroscoped the posterolateral portal.  Systematic inspection of the shoulder showed tearing in the anterior superior labrum, which portion of the labrum with subluxing the joint, this was debrided, this was not associated with any instability. Additionally, there was a full width partial-thickness rotator cuff tear estimated about 20% to 30% of the rotator cuff.  Thickness was debrided as well.  Subacromial space was noted to be inflamed, hypertrophied with a very prominent type 3 acromion.  There was an abrasion of the cup and no full- thickness cuff.  Tear was appreciated.  We performed a bursectomy, release of the CA ligament and an acromioplasty.  I elected not to do a distal clavicle excision.  Shoulder was drained free of fluid.  Portals were closed with nylon, placed in lightly compressive sterile dressing in a sling,  and taken to the recovery room in stable condition.     Dyke BrackettW. D. Thu Baggett, M.D.     WDC/MEDQ  D:  12/22/2013  T:  12/22/2013  Job:  [id number]

## 2014-01-15 ENCOUNTER — Encounter (HOSPITAL_COMMUNITY): Payer: Self-pay | Admitting: Emergency Medicine

## 2014-01-15 ENCOUNTER — Emergency Department (INDEPENDENT_AMBULATORY_CARE_PROVIDER_SITE_OTHER)
Admission: EM | Admit: 2014-01-15 | Discharge: 2014-01-15 | Disposition: A | Discharge status: Home / Self Care | Payer: Medicare Other | Source: Home / Self Care | Attending: Family Medicine | Admitting: Family Medicine

## 2014-01-15 DIAGNOSIS — M79671 Pain in right foot: Secondary | ICD-10-CM

## 2014-01-15 DIAGNOSIS — G8918 Other acute postprocedural pain: Secondary | ICD-10-CM

## 2014-01-15 MED ORDER — HYDROCODONE-ACETAMINOPHEN 10-325 MG PO TABS: 1.0000 | ORAL_TABLET | Freq: Four times a day (QID) | ORAL | Status: DC | PRN

## 2014-01-15 NOTE — ED Notes (Signed)
Reports right foot pain onset yest night; hit greater toe against wooden chair Also reports right greater toe surgery "couple of weeks ago" done [facility] Dr[facility] Pain is 10/10; alert, no signs of acute distress.

## 2014-01-15 NOTE — ED Provider Notes (Signed)
CSN: [id number]     Arrival date & time 01/15/14  1312 History   First MD Initiated Contact with Patient 01/15/14 1326     Chief Complaint  Patient presents with  . Foot Pain   (Consider location/radiation/quality/duration/timing/severity/associated sxs/prior Treatment) HPI Comments: 53 year old morbidly obese male states that he had surgery to the right toe and meta tarsal approximate 2 weeks ago. He has continued to have pain and swelling in the forefoot since that time but is gotten worse in the past 48 hours. His pain is never abated. He was given hydrocodone but states it never helped that much. He states that he stumped his toe earlier this morning and caused increased pain but states the pain he had before was still moderate to severe. States he is unable to sleep at night.   Past Medical History  Diagnosis Date  . Hypertension   . Diabetes mellitus   . Asthma   . Obesity   . Sleep apnea     CPAP-has had UPPP-tonsils and turb red  . Apnea, sleep     was tested 2005  . Lumbar post-laminectomy syndrome 04/02/2013  . Tobacco abuse 03/13/2013  . Hyperlipemia 10/12/2013  . Pain in joint, shoulder region 10/12/2013   Past Surgical History  Procedure Laterality Date  . Heel spurs      bilateral feel  . Spinal cord stimulator implant  1997  . Back surgery  2009  . Nasal turbinate reduction  8/13    septoplasty-UPPP-tonsils  . Ganglion cyst excision  2/14-[facility]    lt wrist   . Tonsillectomy    . Dorsal compartment release Left 08/13/2012    Procedure: RELEASE DORSAL COMPARTMENT (DEQUERVAIN) and exploration of radial nerve branch;  Surgeon: Tami RibasKevin R Kuzma, MD;  Location: [facility];  Service: Orthopedics;  Laterality: Left;  . Ganglion cyst excision Left 08/13/2012    Procedure: REMOVAL GANGLION OF WRIST;  Surgeon: Tami RibasKevin R Kuzma, MD;  Location: [facility];  Service: Orthopedics;  Laterality: Left;  . Spinal cord stimulator insertion N/A 04/02/2013   Procedure: LUMBAR SPINAL CORD STIMULATOR Insertion;  Surgeon: Gwynne EdingerPaul C Harkins, MD;  Location: [facility];  Service: Neurosurgery;  Laterality: N/A;  . Shoulder arthroscopy Right 07/02/2013    Procedure: RIGHT SHOULDER ARTHROSCOPY WITH EXTENSIVE DEBRIDEMENT OF ROTATOR CUFF AND LABRUM, SUBACROMIAL DECOMPRESSION, PARTIAL ACROMIOPLASTY WITH CORACOACROMIAL RELEASE;  Surgeon: Thera FlakeW D Caffrey Jr., MD;  Location: [facility];  Service: Orthopedics;  Laterality: Right;   Family History  Problem Relation Age of Onset  . Hypertension Mother   . Heart attack Father    History  Substance Use Topics  . Smoking status: Current Some Day Smoker -- 0.25 packs/day for 30 years    Types: Cigarettes  . Smokeless tobacco: Never Used     Comment: 1 pack will last him 3-4 weeks  . Alcohol Use: Yes     Comment: 1 pint over the weekend...its not all the time    Review of Systems  Constitutional: Negative.  Negative for fever.  Musculoskeletal:       As per history of present illness    Allergies  Hydromorphone hcl  Home Medications   Prior to Admission medications   Medication Sig Start Date End Date Taking? Authorizing Provider  atorvastatin (LIPITOR) 40 MG tablet Take 40 mg by mouth daily.   Yes Historical Provider, MD  lisinopril-hydrochlorothiazide (PRINZIDE,ZESTORETIC) 20-25 MG per tablet Take 1 tablet by mouth daily.   Yes Historical  Provider, MD  sitaGLIPtan-metformin (JANUMET) 50-1000 MG per tablet Take 1 tablet by mouth 2 (two) times daily with a meal.   Yes Historical Provider, MD  acetaminophen (TYLENOL) 500 MG tablet Take 1,000 mg by mouth every 6 (six) hours as needed for moderate pain.    Historical Provider, MD  albuterol (PROVENTIL HFA;VENTOLIN HFA) 108 (90 BASE) MCG/ACT inhaler Inhale 2 puffs into the lungs every 6 (six) hours as needed for wheezing or shortness of breath.     Historical Provider, MD  albuterol (PROVENTIL) (2.5 MG/3ML) 0.083% nebulizer solution Take 3 mLs (2.5  mg total) by nebulization every 6 (six) hours as needed for wheezing or shortness of breath. 06/21/13   Waymon Budgelinton D Young, MD  Fluticasone Furoate-Vilanterol 100-25 MCG/INH AEPB 1 puff then rinse mouth, one time daily 10/12/13   Waymon Budgelinton D Young, MD  glimepiride (AMARYL) 4 MG tablet Take 4 mg by mouth daily with breakfast.    Historical Provider, MD  HYDROcodone-acetaminophen (NORCO) 10-325 MG per tablet Take 1 tablet by mouth every 6 (six) hours as needed. 01/15/14   Hayden Rasmussenavid Yerlin Gasparyan, NP  ibuprofen (ADVIL,MOTRIN) 200 MG tablet Take 400 mg by mouth every 6 (six) hours as needed for moderate pain.    Historical Provider, MD  indomethacin (INDOCIN) 25 MG capsule Take 1 capsule (25 mg total) by mouth 3 (three) times daily as needed for mild pain or moderate pain. 11/03/13   Mathis FareJennifer Lee H Presson, PA  predniSONE (DELTASONE) 10 MG tablet 4 X 2 DAYS, 3 X 2 DAYS, 2 X 2 DAYS, 1 X 2 DAYS 10/12/13   Waymon Budgelinton D Young, MD   BP 126/85 mmHg  Pulse 100  Temp(Src) 97.8 F (36.6 C) (Oral)  Resp 18  SpO2 95% Physical Exam  Constitutional: He is oriented to person, place, and time. He appears well-developed and well-nourished. No distress.  Pulmonary/Chest: Effort normal. No respiratory distress.  Musculoskeletal:  There is swelling across the right forefoot and in the left toe. It is not tense. There is a slightly brownish discoloration. He states that this appears similar as to his postop appearance. No evidence of necrosis. No evidence of cellulitis or lymphangitis. No evidence of abscess. He is able to wiggle his toes. Distal neurovascular motor sensory is intact.  Neurological: He is alert and oriented to person, place, and time.  Skin: Skin is warm and dry.  Psychiatric: He has a normal mood and affect.  Nursing note and vitals reviewed.   ED Course  Procedures (including critical care time) Labs Review Labs Reviewed - No data to display  Imaging Review No results found.   MDM   1. Post-op pain   2. Right  foot pain    Norco 10 mg every 4 hours when necessary pain #15. He is to call his podiatrist on Monday for reassessment and additional pain medicines if needed. Prednisone as infection, increased redness, drainage, pus, red streaks, fever or other problems return or go to the ER promptly. Elevate No hot soaks      Hayden Rasmussenavid Helder Crisafulli, NP 01/15/14 [phone]

## 2014-01-15 NOTE — Discharge Instructions (Signed)
Pain Relief Preoperatively and Postoperatively °Being a good patient does not mean being a silent one. If you have questions, problems, or concerns about the pain you may feel after surgery, let your caregiver know. Patients have the right to assessment and management of pain. The treatment of pain after surgery is important to speed up recovery and return to normal activities. Severe pain after surgery, and the fear or anxiety associated with that pain, may cause extreme discomfort that: °· Prevents sleep. °· Decreases the ability to breathe deeply and cough. This can cause pneumonia or other upper airway infections. °· Causes your heart to beat faster and your blood pressure to be higher. °· Increases the risk for constipation and bloating. °· Decreases the ability of wounds to heal. °· May result in depression, increased anxiety, and feelings of helplessness. °Relief of pain before surgery is also important because it will lessen the pain after surgery. Patients who receive both pain relief before and after surgery experience greater pain relief than those who only receive pain relief after surgery. Let your caregiver know if you are having uncontrolled pain. This is very important. Pain after surgery is more difficult to manage if it is permitted to become severe, so prompt and adequate treatment of acute pain is necessary. °PAIN CONTROL METHODS °Your caregivers follow policies and procedures about the management of patient pain. These guidelines should be explained to you before surgery. Plans for pain control after surgery must be mutually decided upon and instituted with your full understanding and agreement. Do not be afraid to ask questions regarding the care you are receiving. There are many different ways your caregivers will attempt to control your pain, including the following methods. °As needed pain control °· You may be given pain medicine either through your intravenous (IV) tube, or as a pill or  liquid you can swallow. You will need to let your caregiver know when you are having pain. Then, your caregiver will give you the pain medicine ordered for you. °· Your pain medicine may make you constipated. If constipation occurs, drink more liquids if you can. Your caregiver may have you take a mild laxative. °IV patient-controlled analgesia pump (PCA pump) °· You can get your pain medicine through the IV tube which goes into your vein. You are able to control the amount of pain medicine that you get. The pain medicine flows in through an IV tube and is controlled by a pump. This pump gives you a set amount of pain medicine when you push the button hooked up to it. Nobody should push this button but you or someone specifically assigned by you to do so. It is set up to keep you from accidentally giving yourself too much pain medicine. You will be able to start using your pain pump in the recovery room after your surgery. This method can be helpful for most types of surgery. °· If you are still having too much pain, tell your caregiver. Also, tell your caregiver if you are feeling too sleepy or nauseous. °Continuous epidural pain control °· A thin, soft tube (catheter) is put into your back. Pain medicine flows through the catheter to lessen pain in the part of your body where the surgery is done. Continuous epidural pain control may work best for you if you are having surgery on your chest, abdomen, hip area, or legs. The epidural catheter is usually put into your back just before surgery. The catheter is left in until you can eat and take medicine by mouth. In most cases,   this may take 2 to 3 days. °· Giving pain medicine through the epidural catheter may help you heal faster because: °¨ Your bowel gets back to normal faster. °¨ You can get back to eating sooner. °¨ You can be up and walking sooner. °Medicine that numbs the area (local anesthetic) °· You may receive an injection of pain medicine near where the  pain is (local infiltration). °· You may receive an injection of pain medicine near the nerve that controls the sensation to a specific part of the body (peripheral nerve block). °· Medicine may be put in the spine to block pain (spinal block). °Opioids °· Moderate to moderately severe acute pain after surgery may respond to opioids. Opioids are narcotic pain medicine. Opioids are often combined with non-narcotic medicines to improve pain relief, diminish the risk of side effects, and reduce the chance of addiction. °· If you follow your caregiver's directions about taking opioids and you do not have a history of substance abuse, your risk of becoming addicted is exceptionally small. Opioids are given for short periods of time in careful doses to prevent addiction. °Other methods of pain control include: °· Steroids. °· Physical therapy. °· Heat and cold therapy. °· Compression, such as wrapping an elastic bandage around the area of pain. °· Massage. °These various ways of controlling pain may be used together. Combining different methods of pain control is called multimodal analgesia. Using this approach has many benefits, including being able to eat, move around, and leave the hospital sooner. °Document Released: 04/13/2002 Document Revised: 04/15/2011 Document Reviewed: 04/17/2010 °[employer]® Patient Information ©2015 [employer]. This information is not intended to replace advice given to you by your health care provider. Make sure you discuss any questions you have with your health care provider. ° °

## 2014-03-22 ENCOUNTER — Other Ambulatory Visit: Payer: Self-pay | Admitting: Orthopedic Surgery

## 2014-03-22 DIAGNOSIS — M25511 Pain in right shoulder: Secondary | ICD-10-CM

## 2014-03-25 ENCOUNTER — Ambulatory Visit
Admission: RE | Admit: 2014-03-25 | Discharge: 2014-03-25 | Disposition: A | Discharge status: Home / Self Care | Payer: Medicare Other | Source: Ambulatory Visit | Attending: Orthopedic Surgery | Admitting: Orthopedic Surgery

## 2014-03-25 DIAGNOSIS — M25511 Pain in right shoulder: Secondary | ICD-10-CM

## 2014-04-12 ENCOUNTER — Encounter (HOSPITAL_BASED_OUTPATIENT_CLINIC_OR_DEPARTMENT_OTHER)
Admission: RE | Admit: 2014-04-12 | Discharge: 2014-04-12 | Disposition: A | Discharge status: Home / Self Care | Payer: Medicare Other | Source: Ambulatory Visit | Attending: Orthopedic Surgery | Admitting: Orthopedic Surgery

## 2014-04-12 ENCOUNTER — Encounter (HOSPITAL_BASED_OUTPATIENT_CLINIC_OR_DEPARTMENT_OTHER): Payer: Self-pay | Admitting: *Deleted

## 2014-04-12 DIAGNOSIS — I10 Essential (primary) hypertension: Secondary | ICD-10-CM | POA: Diagnosis not present

## 2014-04-12 DIAGNOSIS — E785 Hyperlipidemia, unspecified: Secondary | ICD-10-CM | POA: Diagnosis not present

## 2014-04-12 DIAGNOSIS — M7521 Bicipital tendinitis, right shoulder: Secondary | ICD-10-CM | POA: Diagnosis not present

## 2014-04-12 DIAGNOSIS — G4730 Sleep apnea, unspecified: Secondary | ICD-10-CM | POA: Diagnosis not present

## 2014-04-12 DIAGNOSIS — M13811 Other specified arthritis, right shoulder: Secondary | ICD-10-CM | POA: Diagnosis not present

## 2014-04-12 DIAGNOSIS — Z6841 Body Mass Index (BMI) 40.0 and over, adult: Secondary | ICD-10-CM | POA: Diagnosis not present

## 2014-04-12 DIAGNOSIS — Z886 Allergy status to analgesic agent status: Secondary | ICD-10-CM | POA: Diagnosis not present

## 2014-04-12 DIAGNOSIS — M7541 Impingement syndrome of right shoulder: Secondary | ICD-10-CM | POA: Diagnosis not present

## 2014-04-12 DIAGNOSIS — F17210 Nicotine dependence, cigarettes, uncomplicated: Secondary | ICD-10-CM | POA: Diagnosis not present

## 2014-04-12 DIAGNOSIS — M24111 Other articular cartilage disorders, right shoulder: Secondary | ICD-10-CM | POA: Diagnosis not present

## 2014-04-12 DIAGNOSIS — J45909 Unspecified asthma, uncomplicated: Secondary | ICD-10-CM | POA: Diagnosis not present

## 2014-04-12 DIAGNOSIS — E669 Obesity, unspecified: Secondary | ICD-10-CM | POA: Diagnosis not present

## 2014-04-12 DIAGNOSIS — E119 Type 2 diabetes mellitus without complications: Secondary | ICD-10-CM | POA: Diagnosis not present

## 2014-04-12 LAB — BASIC METABOLIC PANEL
Anion gap: 4 — ABNORMAL LOW (ref 5–15)
BUN: 18 mg/dL (ref 6–23)
CO2: 28 mmol/L (ref 19–32)
CREATININE: 0.93 mg/dL (ref 0.50–1.35)
Calcium: 9.6 mg/dL (ref 8.4–10.5)
Chloride: 105 mmol/L (ref 96–112)
Glucose, Bld: 231 mg/dL — ABNORMAL HIGH (ref 70–99)
POTASSIUM: 4.1 mmol/L (ref 3.5–5.1)
Sodium: 137 mmol/L (ref 135–145)

## 2014-04-12 NOTE — Progress Notes (Signed)
To come in for ekg-bmet-has had several surgeries here-to bring cpap,meds,in case he has to stay overnight-he has not had to last shoulder surgery

## 2014-04-14 ENCOUNTER — Encounter (HOSPITAL_BASED_OUTPATIENT_CLINIC_OR_DEPARTMENT_OTHER): Payer: Self-pay | Admitting: *Deleted

## 2014-04-14 ENCOUNTER — Ambulatory Visit: Payer: Self-pay | Admitting: Physician Assistant

## 2014-04-14 NOTE — H&P (Signed)
Christian Bowen is an 54 y.o. male.   Chief Complaint: right shoulder pain  HPI: Right shoulder pain, popping and clicking.  History of present illness: Patient is a 54 year-old male.  We had done a debridement type right shoulder arthroscopy, including acromioplasty back in May of last year.  Believe he had a labral tear due to extensive debridement, as well as partial rotator cuff tear, which we debrided.  He had done well after surgery, although complains of more of a popping and catching sensation over the last 2-3 months.  He is active and works out at [employer].  He is on disability as well.  He states he is taking Tylenol and occasional Advil for the pain. We elected to obtain new CT shoulder which shows no tear but a type III acromion.  Past Medical History  Diagnosis Date  . Hypertension   . Diabetes mellitus   . Asthma   . Obesity   . Sleep apnea     CPAP-has had UPPP-tonsils and turb red  . Apnea, sleep     was tested 2005  . Lumbar post-laminectomy syndrome 04/02/2013  . Tobacco abuse 03/13/2013  . Hyperlipemia 10/12/2013  . Pain in joint, shoulder region 10/12/2013    Past Surgical History  Procedure Laterality Date  . Heel spurs      bilateral feel  . Spinal cord stimulator implant  1997  . Back surgery  2009  . Nasal turbinate reduction  8/13    septoplasty-UPPP-tonsils  . Ganglion cyst excision  2/14-[facility]    lt wrist   . Tonsillectomy    . Dorsal compartment release Left 08/13/2012    Procedure: RELEASE DORSAL COMPARTMENT (DEQUERVAIN) and exploration of radial nerve branch;  Surgeon: Tami Ribas, MD;  Location: [facility];  Service: Orthopedics;  Laterality: Left;  . Ganglion cyst excision Left 08/13/2012    Procedure: REMOVAL GANGLION OF WRIST;  Surgeon: Tami Ribas, MD;  Location: [facility];  Service: Orthopedics;  Laterality: Left;  . Spinal cord stimulator insertion N/A 04/02/2013    Procedure: LUMBAR SPINAL CORD STIMULATOR Insertion;   Surgeon: Gwynne Edinger, MD;  Location: [facility];  Service: Neurosurgery;  Laterality: N/A;  . Shoulder arthroscopy Right 07/02/2013    Procedure: RIGHT SHOULDER ARTHROSCOPY WITH EXTENSIVE DEBRIDEMENT OF ROTATOR CUFF AND LABRUM, SUBACROMIAL DECOMPRESSION, PARTIAL ACROMIOPLASTY WITH CORACOACROMIAL RELEASE;  Surgeon: Thera Flake., MD;  Location: [facility];  Service: Orthopedics;  Laterality: Right;    Family History  Problem Relation Age of Onset  . Hypertension Mother   . Heart attack Father    Social History:  reports that he has been smoking Cigarettes.  He has a 7.5 pack-year smoking history. He has never used smokeless tobacco. He reports that he drinks alcohol. He reports that he does not use illicit drugs.  Allergies:  Allergies  Allergen Reactions  . Hydromorphone Hcl     Patient went into respiratory distress     (Not in a hospital admission)  No results found for this or any previous visit (from the past 48 hour(s)). No results found.  Review of Systems  Constitutional: Negative.   HENT: Negative.   Eyes: Negative.   Respiratory: Negative.   Cardiovascular: Negative.   Gastrointestinal: Negative.   Genitourinary: Negative.   Musculoskeletal: Positive for joint pain. Negative for falls.  Skin: Negative.   Neurological: Negative.   Endo/Heme/Allergies: Negative.   Psychiatric/Behavioral: Negative.  There were no vitals taken for this visit. Physical Exam  Constitutional: He is oriented to person, place, and time. He appears well-developed and well-nourished. No distress.  HENT:  Head: Normocephalic and atraumatic.  Nose: Nose normal.  Eyes: Conjunctivae and EOM are normal. Pupils are equal, round, and reactive to light.  Neck: Normal range of motion. Neck supple.  Cardiovascular: Normal rate, regular rhythm and intact distal pulses.   Respiratory: Effort normal. No respiratory distress. He has no wheezes.  GI: Soft. He exhibits no  distension. There is no tenderness.  Musculoskeletal:       Right shoulder: He exhibits decreased range of motion, tenderness, bony tenderness, pain and spasm. He exhibits no deformity and normal strength.  Lymphadenopathy:    He has no cervical adenopathy.  Neurological: He is alert and oriented to person, place, and time.  Skin: Skin is warm and dry. No erythema.  Psychiatric: He has a normal mood and affect. His behavior is normal.     Assessment/Plan Right shoulder impingement  We discussed risks and benefits of right shoulder arthroscopy debridement open acromioplasty distal clavicle excision pt wishes to proceed this will be done outpatient, IS block.   Margart SicklesChadwell, Marzelle Rutten 04/14/2014, 12:09 PM

## 2014-04-15 ENCOUNTER — Ambulatory Visit (HOSPITAL_BASED_OUTPATIENT_CLINIC_OR_DEPARTMENT_OTHER)
Admission: RE | Admit: 2014-04-15 | Discharge: 2014-04-16 | Disposition: A | Discharge status: Home / Self Care | Payer: Medicare Other | Source: Ambulatory Visit | Attending: Orthopedic Surgery | Admitting: Orthopedic Surgery

## 2014-04-15 ENCOUNTER — Ambulatory Visit (HOSPITAL_BASED_OUTPATIENT_CLINIC_OR_DEPARTMENT_OTHER): Payer: Medicare Other | Admitting: Certified Registered"

## 2014-04-15 ENCOUNTER — Encounter (HOSPITAL_BASED_OUTPATIENT_CLINIC_OR_DEPARTMENT_OTHER): Payer: Self-pay | Admitting: *Deleted

## 2014-04-15 ENCOUNTER — Encounter (HOSPITAL_BASED_OUTPATIENT_CLINIC_OR_DEPARTMENT_OTHER)
Admission: RE | Disposition: A | Discharge status: Home / Self Care | Payer: Self-pay | Source: Ambulatory Visit | Attending: Orthopedic Surgery

## 2014-04-15 DIAGNOSIS — M13811 Other specified arthritis, right shoulder: Secondary | ICD-10-CM | POA: Insufficient documentation

## 2014-04-15 DIAGNOSIS — M7521 Bicipital tendinitis, right shoulder: Secondary | ICD-10-CM | POA: Diagnosis not present

## 2014-04-15 DIAGNOSIS — Z886 Allergy status to analgesic agent status: Secondary | ICD-10-CM | POA: Insufficient documentation

## 2014-04-15 DIAGNOSIS — M24111 Other articular cartilage disorders, right shoulder: Secondary | ICD-10-CM | POA: Diagnosis not present

## 2014-04-15 DIAGNOSIS — Z6841 Body Mass Index (BMI) 40.0 and over, adult: Secondary | ICD-10-CM | POA: Insufficient documentation

## 2014-04-15 DIAGNOSIS — E119 Type 2 diabetes mellitus without complications: Secondary | ICD-10-CM | POA: Insufficient documentation

## 2014-04-15 DIAGNOSIS — M7541 Impingement syndrome of right shoulder: Secondary | ICD-10-CM | POA: Insufficient documentation

## 2014-04-15 DIAGNOSIS — F17210 Nicotine dependence, cigarettes, uncomplicated: Secondary | ICD-10-CM | POA: Insufficient documentation

## 2014-04-15 DIAGNOSIS — E785 Hyperlipidemia, unspecified: Secondary | ICD-10-CM | POA: Insufficient documentation

## 2014-04-15 DIAGNOSIS — E669 Obesity, unspecified: Secondary | ICD-10-CM | POA: Insufficient documentation

## 2014-04-15 DIAGNOSIS — J45909 Unspecified asthma, uncomplicated: Secondary | ICD-10-CM | POA: Insufficient documentation

## 2014-04-15 DIAGNOSIS — G4730 Sleep apnea, unspecified: Secondary | ICD-10-CM | POA: Insufficient documentation

## 2014-04-15 DIAGNOSIS — I10 Essential (primary) hypertension: Secondary | ICD-10-CM | POA: Insufficient documentation

## 2014-04-15 HISTORY — PX: SHOULDER ARTHROSCOPY WITH BICEPSTENOTOMY: SHX6204

## 2014-04-15 LAB — POCT HEMOGLOBIN-HEMACUE: HEMOGLOBIN: 13.3 g/dL (ref 13.0–17.0)

## 2014-04-15 LAB — GLUCOSE, CAPILLARY: Glucose-Capillary: 187 mg/dL — ABNORMAL HIGH (ref 70–99)

## 2014-04-15 SURGERY — SHOULDER ARTHROSCOPY WITH SUBACROMIAL DECOMPRESSION AND DISTAL CLAVICLE EXCISION
Anesthesia: General | Site: Shoulder | Laterality: Right

## 2014-04-15 MED ORDER — MIDAZOLAM HCL 5 MG/5ML IJ SOLN
INTRAMUSCULAR | Status: DC | PRN
  Administered 2014-04-15: 2 mg via INTRAVENOUS

## 2014-04-15 MED ORDER — CEFAZOLIN SODIUM-DEXTROSE 2-3 GM-% IV SOLR
INTRAVENOUS | Status: AC
  Filled 2014-04-15: qty 50

## 2014-04-15 MED ORDER — OXYCODONE-ACETAMINOPHEN 5-325 MG PO TABS
ORAL_TABLET | ORAL | Status: AC
  Filled 2014-04-15: qty 1

## 2014-04-15 MED ORDER — SODIUM CHLORIDE 0.9 % IV SOLN
INTRAVENOUS | Status: DC
  Administered 2014-04-15: 15:00:00 via INTRAVENOUS

## 2014-04-15 MED ORDER — FENTANYL CITRATE 0.05 MG/ML IJ SOLN
INTRAMUSCULAR | Status: AC
  Filled 2014-04-15: qty 2

## 2014-04-15 MED ORDER — ONDANSETRON HCL 4 MG/2ML IJ SOLN: 4.0000 mg | Freq: Once | INTRAMUSCULAR | Status: AC | PRN

## 2014-04-15 MED ORDER — ONDANSETRON HCL 4 MG PO TABS: 4.0000 mg | ORAL_TABLET | Freq: Four times a day (QID) | ORAL | Status: DC | PRN

## 2014-04-15 MED ORDER — LIDOCAINE HCL (CARDIAC) 20 MG/ML IV SOLN
INTRAVENOUS | Status: DC | PRN
  Administered 2014-04-15: 30 mg via INTRAVENOUS

## 2014-04-15 MED ORDER — CEFAZOLIN SODIUM 10 G IJ SOLR
3.0000 g | INTRAMUSCULAR | Status: AC
  Administered 2014-04-15: 3 g via INTRAVENOUS

## 2014-04-15 MED ORDER — CEFAZOLIN SODIUM-DEXTROSE 2-3 GM-% IV SOLR
2.0000 g | Freq: Four times a day (QID) | INTRAVENOUS | Status: AC
  Administered 2014-04-15 (×2): 2 g via INTRAVENOUS

## 2014-04-15 MED ORDER — OXYCODONE-ACETAMINOPHEN 5-325 MG PO TABS
1.0000 | ORAL_TABLET | ORAL | Status: DC | PRN
  Administered 2014-04-15: 2 via ORAL
  Administered 2014-04-15: 1 via ORAL
  Administered 2014-04-15 – 2014-04-16 (×3): 2 via ORAL
  Filled 2014-04-15 (×4): qty 2

## 2014-04-15 MED ORDER — METHOCARBAMOL 500 MG PO TABS
500.0000 mg | ORAL_TABLET | Freq: Four times a day (QID) | ORAL | Status: DC | PRN
  Administered 2014-04-15 – 2014-04-16 (×3): 500 mg via ORAL
  Filled 2014-04-15 (×3): qty 1

## 2014-04-15 MED ORDER — CEFAZOLIN SODIUM 1-5 GM-% IV SOLN
INTRAVENOUS | Status: AC
  Filled 2014-04-15: qty 50

## 2014-04-15 MED ORDER — SUCCINYLCHOLINE CHLORIDE 20 MG/ML IJ SOLN
INTRAMUSCULAR | Status: DC | PRN
  Administered 2014-04-15: 200 mg via INTRAVENOUS

## 2014-04-15 MED ORDER — METHOCARBAMOL 750 MG PO TABS
750.0000 mg | ORAL_TABLET | Freq: Four times a day (QID) | ORAL | Status: DC
Start: 2014-04-15 — End: 2015-08-31

## 2014-04-15 MED ORDER — CHLORHEXIDINE GLUCONATE 4 % EX LIQD: 60.0000 mL | Freq: Once | CUTANEOUS | Status: DC

## 2014-04-15 MED ORDER — MIDAZOLAM HCL 2 MG/2ML IJ SOLN
INTRAMUSCULAR | Status: AC
  Filled 2014-04-15: qty 2

## 2014-04-15 MED ORDER — MORPHINE SULFATE 2 MG/ML IJ SOLN
1.0000 mg | INTRAMUSCULAR | Status: DC | PRN
  Administered 2014-04-15: 1 mg via INTRAVENOUS
  Filled 2014-04-15: qty 1

## 2014-04-15 MED ORDER — FENTANYL CITRATE 0.05 MG/ML IJ SOLN
25.0000 ug | INTRAMUSCULAR | Status: DC | PRN
  Administered 2014-04-15: 25 ug via INTRAVENOUS
  Administered 2014-04-15: 50 ug via INTRAVENOUS
  Administered 2014-04-15: 25 ug via INTRAVENOUS

## 2014-04-15 MED ORDER — EPHEDRINE SULFATE 50 MG/ML IJ SOLN
INTRAMUSCULAR | Status: DC | PRN
  Administered 2014-04-15 (×2): 10 mg via INTRAVENOUS

## 2014-04-15 MED ORDER — PROPOFOL 10 MG/ML IV BOLUS
INTRAVENOUS | Status: DC | PRN
  Administered 2014-04-15: 100 mg via INTRAVENOUS
  Administered 2014-04-15: 260 mg via INTRAVENOUS

## 2014-04-15 MED ORDER — HYDROMORPHONE HCL 1 MG/ML IJ SOLN
INTRAMUSCULAR | Status: AC
  Filled 2014-04-15: qty 1

## 2014-04-15 MED ORDER — SODIUM CHLORIDE 0.9 % IV SOLN: INTRAVENOUS | Status: DC

## 2014-04-15 MED ORDER — DEXAMETHASONE SODIUM PHOSPHATE 4 MG/ML IJ SOLN
INTRAMUSCULAR | Status: DC | PRN
  Administered 2014-04-15: 10 mg via INTRAVENOUS

## 2014-04-15 MED ORDER — HYDROMORPHONE HCL 1 MG/ML IJ SOLN
0.2500 mg | INTRAMUSCULAR | Status: DC | PRN
  Administered 2014-04-15 (×2): 0.25 mg via INTRAVENOUS

## 2014-04-15 MED ORDER — FENTANYL CITRATE 0.05 MG/ML IJ SOLN
50.0000 ug | INTRAMUSCULAR | Status: DC | PRN
  Administered 2014-04-15: 100 ug via INTRAVENOUS

## 2014-04-15 MED ORDER — ONDANSETRON HCL 4 MG/2ML IJ SOLN: 4.0000 mg | Freq: Four times a day (QID) | INTRAMUSCULAR | Status: DC | PRN

## 2014-04-15 MED ORDER — METOCLOPRAMIDE HCL 5 MG PO TABS: 5.0000 mg | ORAL_TABLET | Freq: Three times a day (TID) | ORAL | Status: DC | PRN

## 2014-04-15 MED ORDER — ONDANSETRON HCL 4 MG/2ML IJ SOLN
INTRAMUSCULAR | Status: DC | PRN
  Administered 2014-04-15: 4 mg via INTRAVENOUS

## 2014-04-15 MED ORDER — PHENYLEPHRINE HCL 10 MG/ML IJ SOLN
10.0000 mg | INTRAVENOUS | Status: DC | PRN
  Administered 2014-04-15: 40 ug/min via INTRAVENOUS

## 2014-04-15 MED ORDER — DOCUSATE SODIUM 100 MG PO CAPS
100.0000 mg | ORAL_CAPSULE | Freq: Two times a day (BID) | ORAL | Status: DC
  Administered 2014-04-16: 100 mg via ORAL
  Filled 2014-04-15: qty 1

## 2014-04-15 MED ORDER — MIDAZOLAM HCL 2 MG/2ML IJ SOLN
1.0000 mg | INTRAMUSCULAR | Status: DC | PRN
  Administered 2014-04-15: 2 mg via INTRAVENOUS

## 2014-04-15 MED ORDER — OXYCODONE-ACETAMINOPHEN 10-325 MG PO TABS: 1.0000 | ORAL_TABLET | ORAL | Status: DC | PRN

## 2014-04-15 MED ORDER — LACTATED RINGERS IV SOLN
INTRAVENOUS | Status: DC
  Administered 2014-04-15 (×2): via INTRAVENOUS

## 2014-04-15 MED ORDER — METOCLOPRAMIDE HCL 5 MG/ML IJ SOLN: 5.0000 mg | Freq: Three times a day (TID) | INTRAMUSCULAR | Status: DC | PRN

## 2014-04-15 MED ORDER — FENTANYL CITRATE 0.05 MG/ML IJ SOLN
INTRAMUSCULAR | Status: AC
Start: 2014-04-15 — End: 2014-04-15
  Filled 2014-04-15: qty 6

## 2014-04-15 MED ORDER — METHOCARBAMOL 1000 MG/10ML IJ SOLN: 500.0000 mg | Freq: Four times a day (QID) | INTRAVENOUS | Status: DC | PRN

## 2014-04-15 SURGICAL SUPPLY — 77 items
BENZOIN TINCTURE PRP APPL 2/3 (GAUZE/BANDAGES/DRESSINGS) ×2 IMPLANT
BLADE 4.2CUDA (BLADE) ×2 IMPLANT
BLADE AVERAGE 25X9 (BLADE) ×2 IMPLANT
BLADE CUTTER GATOR 3.5 (BLADE) IMPLANT
BLADE SURG 15 STRL LF DISP TIS (BLADE) ×1 IMPLANT
BLADE SURG 15 STRL SS (BLADE) ×1
BLADE VORTEX 6.0 (BLADE) IMPLANT
BUR 3.5 LG SPHERICAL (BURR) IMPLANT
BUR EGG 3PK/BX (BURR) IMPLANT
BUR OVAL 4.0 (BURR) IMPLANT
BUR OVAL 6.0 (BURR) ×2 IMPLANT
BUR VERTEX HOODED 4.5 (BURR) IMPLANT
BURR 3.5 LG SPHERICAL (BURR)
CANNULA SHOULDER 7CM (CANNULA) IMPLANT
CANNULA TWIST IN 8.25X7CM (CANNULA) IMPLANT
CLEANER CAUTERY TIP 5X5 PAD (MISCELLANEOUS) IMPLANT
CUTTER MENISCUS  4.2MM (BLADE)
CUTTER MENISCUS 4.2MM (BLADE) IMPLANT
DECANTER SPIKE VIAL GLASS SM (MISCELLANEOUS) IMPLANT
DRAPE STERI 35X30 U-POUCH (DRAPES) ×2 IMPLANT
DRAPE SURG 17X23 STRL (DRAPES) ×2 IMPLANT
DRAPE U-SHAPE 76X120 STRL (DRAPES) ×4 IMPLANT
DRSG EMULSION OIL 3X3 NADH (GAUZE/BANDAGES/DRESSINGS) IMPLANT
DRSG PAD ABDOMINAL 8X10 ST (GAUZE/BANDAGES/DRESSINGS) ×4 IMPLANT
DURAPREP 26ML APPLICATOR (WOUND CARE) ×2 IMPLANT
ELECT REM PT RETURN 9FT ADLT (ELECTROSURGICAL) ×2
ELECTRODE REM PT RTRN 9FT ADLT (ELECTROSURGICAL) ×1 IMPLANT
GAUZE SPONGE 4X4 12PLY STRL (GAUZE/BANDAGES/DRESSINGS) ×2 IMPLANT
GLOVE BIO SURGEON STRL SZ 6.5 (GLOVE) ×2 IMPLANT
GLOVE BIO SURGEON STRL SZ7.5 (GLOVE) ×4 IMPLANT
GLOVE BIOGEL PI IND STRL 7.0 (GLOVE) ×1 IMPLANT
GLOVE BIOGEL PI IND STRL 8 (GLOVE) ×2 IMPLANT
GLOVE BIOGEL PI INDICATOR 7.0 (GLOVE) ×1
GLOVE BIOGEL PI INDICATOR 8 (GLOVE) ×2
GLOVE SURG ORTHO 8.0 STRL STRW (GLOVE) ×2 IMPLANT
GOWN STRL REUS W/ TWL LRG LVL3 (GOWN DISPOSABLE) ×1 IMPLANT
GOWN STRL REUS W/ TWL XL LVL3 (GOWN DISPOSABLE) ×1 IMPLANT
GOWN STRL REUS W/TWL LRG LVL3 (GOWN DISPOSABLE) ×1
GOWN STRL REUS W/TWL XL LVL3 (GOWN DISPOSABLE) ×3 IMPLANT
MANIFOLD NEPTUNE II (INSTRUMENTS) ×2 IMPLANT
NEEDLE 1/2 CIR CATGUT .05X1.09 (NEEDLE) IMPLANT
NEEDLE SCORPION MULTI FIRE (NEEDLE) IMPLANT
NS IRRIG 1000ML POUR BTL (IV SOLUTION) ×2 IMPLANT
PACK ARTHROSCOPY DSU (CUSTOM PROCEDURE TRAY) ×2 IMPLANT
PACK BASIN DAY SURGERY FS (CUSTOM PROCEDURE TRAY) ×2 IMPLANT
PAD CLEANER CAUTERY TIP 5X5 (MISCELLANEOUS)
PAD ORTHO SHOULDER 7X19 LRG (SOFTGOODS) IMPLANT
PENCIL BUTTON HOLSTER BLD 10FT (ELECTRODE) ×2 IMPLANT
SET ARTHROSCOPY TUBING (MISCELLANEOUS) ×1
SET ARTHROSCOPY TUBING LN (MISCELLANEOUS) ×1 IMPLANT
SLING ARM LRG ADULT FOAM STRAP (SOFTGOODS) IMPLANT
SLING ARM MED ADULT FOAM STRAP (SOFTGOODS) IMPLANT
SLING ULTRA II MEDIUM (SOFTGOODS) IMPLANT
SLING ULTRA II SMALL (SOFTGOODS) IMPLANT
SPONGE LAP 4X18 X RAY DECT (DISPOSABLE) ×2 IMPLANT
STAPLER VISISTAT 35W (STAPLE) IMPLANT
STRIP CLOSURE SKIN 1/2X4 (GAUZE/BANDAGES/DRESSINGS) ×2 IMPLANT
SUCTION FRAZIER TIP 10 FR DISP (SUCTIONS) IMPLANT
SUT BONE WAX W31G (SUTURE) ×2 IMPLANT
SUT ETHILON 4 0 PS 2 18 (SUTURE) ×2 IMPLANT
SUT FIBERWIRE #2 38 T-5 BLUE (SUTURE)
SUT MNCRL AB 3-0 PS2 18 (SUTURE) IMPLANT
SUT PROLENE 3 0 PS 2 (SUTURE) IMPLANT
SUT TICRON 1 T 12 (SUTURE) ×2 IMPLANT
SUT TIGER TAPE 7 IN WHITE (SUTURE) IMPLANT
SUT VIC AB 0 CT1 27 (SUTURE) ×1
SUT VIC AB 0 CT1 27XBRD ANBCTR (SUTURE) ×1 IMPLANT
SUT VIC AB 1 CT1 27 (SUTURE)
SUT VIC AB 1 CT1 27XBRD ANBCTR (SUTURE) IMPLANT
SUT VIC AB 2-0 SH 27 (SUTURE) ×1
SUT VIC AB 2-0 SH 27XBRD (SUTURE) ×1 IMPLANT
SUTURE FIBERWR #2 38 T-5 BLUE (SUTURE) IMPLANT
TAPE FIBER 2MM 7IN #2 BLUE (SUTURE) IMPLANT
TOWEL OR 17X24 6PK STRL BLUE (TOWEL DISPOSABLE) ×2 IMPLANT
WAND STAR VAC 90 (SURGICAL WAND) ×2 IMPLANT
WATER STERILE IRR 1000ML POUR (IV SOLUTION) ×2 IMPLANT
YANKAUER SUCT BULB TIP NO VENT (SUCTIONS) IMPLANT

## 2014-04-15 NOTE — Brief Op Note (Signed)
04/15/2014  11:35 AM  PATIENT:  Orpah GreekLuther Lipinski  54 y.o. male  PRE-OPERATIVE DIAGNOSIS:  RIGHT SHOULDER IMPINGEMENT  POST-OPERATIVE DIAGNOSIS:  right shoulder impingement biceps tendinopathy   PROCEDURE:  Procedure(s): RIGHT SHOULDER SUBACROMIAL OPEN DISTAL CLAVICLE RESECTION (Right) SHOULDER ARTHROSCOPY WITH BICEPSTENOTOMY (Right) Open acromioplasty  SURGEON:  Surgeon(s) and Role:    * Frederico Hammananiel Caffrey, MD - Primary  PHYSICIAN ASSISTANT: Margart SicklesJoshua Latif Nazareno, PA-C  ASSISTANTS:   ANESTHESIA:   regional and general  EBL:  Total I/O In: 1000 [I.V.:1000] Out: -   BLOOD ADMINISTERED:none  DRAINS: none   LOCAL MEDICATIONS USED:  NONE  SPECIMEN:  No Specimen  DISPOSITION OF SPECIMEN:  N/A  COUNTS:  YES  TOURNIQUET:  * No tourniquets in log *  DICTATION: .Other Dictation: Dictation Number unknown  PLAN OF CARE: Admit for overnight observation  PATIENT DISPOSITION:  PACU - hemodynamically stable.   Delay start of Pharmacological VTE agent (>24hrs) due to surgical blood loss or risk of bleeding: not applicable

## 2014-04-15 NOTE — Transfer of Care (Signed)
Immediate Anesthesia Transfer of Care Note  Patient: [name]  Procedure(s) Performed: Procedure(s): RIGHT SHOULDER SUBACROMIAL OPEN DISTAL CLAVICLE RESECTION (Right) SHOULDER ARTHROSCOPY WITH BICEPSTENOTOMY (Right)  Patient Location: PACU  Anesthesia Type:GA combined with regional for post-op pain  Level of Consciousness: awake, alert , oriented and patient cooperative  Airway & Oxygen Therapy: Patient Spontanous Breathing and Patient connected to face mask oxygen  Post-op Assessment: Report given to RN and Post -op Vital signs reviewed and stable  Post vital signs: Reviewed and stable  Last Vitals:  Filed Vitals:   04/15/14 0935  BP:   Pulse: 93  Temp:   Resp: 17    Complications: No apparent anesthesia complications

## 2014-04-15 NOTE — H&P (View-Only) (Signed)
Christian Bowen is an 53 y.o. male.   Chief Complaint: right shoulder pain  HPI: Right shoulder pain, popping and clicking.  History of present illness: Patient is a 53 year-old male.  We had done a debridement type right shoulder arthroscopy, including acromioplasty back in May of last year.  Believe he had a labral tear due to extensive debridement, as well as partial rotator cuff tear, which we debrided.  He had done well after surgery, although complains of more of a popping and catching sensation over the last 2-3 months.  He is active and works out at the [employer].  He is on disability as well.  He states he is taking Tylenol and occasional Advil for the pain. We elected to obtain new CT shoulder which shows no tear but a type III acromion.  Past Medical History  Diagnosis Date  . Hypertension   . Diabetes mellitus   . Asthma   . Obesity   . Sleep apnea     CPAP-has had UPPP-tonsils and turb red  . Apnea, sleep     was tested 2005  . Lumbar post-laminectomy syndrome 04/02/2013  . Tobacco abuse 03/13/2013  . Hyperlipemia 10/12/2013  . Pain in joint, shoulder region 10/12/2013    Past Surgical History  Procedure Laterality Date  . Heel spurs      bilateral feel  . Spinal cord stimulator implant  1997  . Back surgery  2009  . Nasal turbinate reduction  8/13    septoplasty-UPPP-tonsils  . Ganglion cyst excision  2/14-[facility]    lt wrist   . Tonsillectomy    . Dorsal compartment release Left 08/13/2012    Procedure: RELEASE DORSAL COMPARTMENT (DEQUERVAIN) and exploration of radial nerve branch;  Surgeon: Kevin R Kuzma, MD;  Location: [facility];  Service: Orthopedics;  Laterality: Left;  . Ganglion cyst excision Left 08/13/2012    Procedure: REMOVAL GANGLION OF WRIST;  Surgeon: Kevin R Kuzma, MD;  Location: [facility];  Service: Orthopedics;  Laterality: Left;  . Spinal cord stimulator insertion N/A 04/02/2013    Procedure: LUMBAR SPINAL CORD STIMULATOR Insertion;   Surgeon: Paul C Harkins, MD;  Location: [facility];  Service: Neurosurgery;  Laterality: N/A;  . Shoulder arthroscopy Right 07/02/2013    Procedure: RIGHT SHOULDER ARTHROSCOPY WITH EXTENSIVE DEBRIDEMENT OF ROTATOR CUFF AND LABRUM, SUBACROMIAL DECOMPRESSION, PARTIAL ACROMIOPLASTY WITH CORACOACROMIAL RELEASE;  Surgeon: W D Caffrey Jr., MD;  Location: [facility];  Service: Orthopedics;  Laterality: Right;    Family History  Problem Relation Age of Onset  . Hypertension Mother   . Heart attack Father    Social History:  reports that he has been smoking Cigarettes.  He has a 7.5 pack-year smoking history. He has never used smokeless tobacco. He reports that he drinks alcohol. He reports that he does not use illicit drugs.  Allergies:  Allergies  Allergen Reactions  . Hydromorphone Hcl     Patient went into respiratory distress     (Not in a hospital admission)  No results found for this or any previous visit (from the past 48 hour(s)). No results found.  Review of Systems  Constitutional: Negative.   HENT: Negative.   Eyes: Negative.   Respiratory: Negative.   Cardiovascular: Negative.   Gastrointestinal: Negative.   Genitourinary: Negative.   Musculoskeletal: Positive for joint pain. Negative for falls.  Skin: Negative.   Neurological: Negative.   Endo/Heme/Allergies: Negative.   Psychiatric/Behavioral: Negative.       There were no vitals taken for this visit. Physical Exam  Constitutional: He is oriented to person, place, and time. He appears well-developed and well-nourished. No distress.  HENT:  Head: Normocephalic and atraumatic.  Nose: Nose normal.  Eyes: Conjunctivae and EOM are normal. Pupils are equal, round, and reactive to light.  Neck: Normal range of motion. Neck supple.  Cardiovascular: Normal rate, regular rhythm and intact distal pulses.   Respiratory: Effort normal. No respiratory distress. He has no wheezes.  GI: Soft. He exhibits no  distension. There is no tenderness.  Musculoskeletal:       Right shoulder: He exhibits decreased range of motion, tenderness, bony tenderness, pain and spasm. He exhibits no deformity and normal strength.  Lymphadenopathy:    He has no cervical adenopathy.  Neurological: He is alert and oriented to person, place, and time.  Skin: Skin is warm and dry. No erythema.  Psychiatric: He has a normal mood and affect. His behavior is normal.     Assessment/Plan Right shoulder impingement  We discussed risks and benefits of right shoulder arthroscopy debridement open acromioplasty distal clavicle excision pt wishes to proceed this will be done outpatient, IS block.   Christian Bowen 04/14/2014, 12:09 PM    

## 2014-04-15 NOTE — Anesthesia Postprocedure Evaluation (Signed)
  Anesthesia Post-op Note  Patient: Christian Bowen  Procedure(s) Performed: Procedure(s): RIGHT SHOULDER SUBACROMIAL OPEN DISTAL CLAVICLE RESECTION (Right) SHOULDER ARTHROSCOPY WITH BICEPSTENOTOMY (Right)  Patient Location: PACU  Anesthesia Type: General   Level of Consciousness: awake, alert  and oriented  Airway and Oxygen Therapy: Patient Spontanous Breathing  Post-op Pain: mild  Post-op Assessment: Post-op Vital signs reviewed  Post-op Vital Signs: Reviewed  Last Vitals:  Filed Vitals:   04/15/14 1350  BP:   Pulse: 99  Temp:   Resp: 18    Complications: No apparent anesthesia complications

## 2014-04-15 NOTE — Discharge Instructions (Signed)
Diet: As you were doing prior to hospitalization   Activity: Increase activity slowly as tolerated  No lifting or driving for 2 weeks   Shower: May shower without a dressing on post op day #3, NO SOAKING in tub   Dressing: You may change your dressing on post op day #3.  Then change the dressing daily with sterile 4"x4"s gauze dressing   Weight Bearing: nonweight bearing right arm, remain in sling except for shower, keep arm close to body  To prevent constipation: you may use a stool softener such as -  Colace ( over the counter) 100 mg by mouth twice a day  Drink plenty of fluids ( prune juice may be helpful) and high fiber foods  Miralax ( over the counter) for constipation as needed.   Precautions: If you experience chest pain or shortness of breath - call 911 immediately For transfer to the hospital emergency department!!  If you develop a fever greater that 101 F, purulent drainage from wound, increased redness or drainage from wound, or calf pain -- Call the office   Follow- Up Appointment: Please call for an appointment to be seen in 6 days  [place] - [phone]   Regional Anesthesia Blocks  1. Numbness or the inability to move the "blocked" extremity may last from 3-48 hours after placement. The length of time depends on the medication injected and your individual response to the medication. If the numbness is not going away after 48 hours, call your surgeon.  2. The extremity that is blocked will need to be protected until the numbness is gone and the  Strength has returned. Because you cannot feel it, you will need to take extra care to avoid injury. Because it may be weak, you may have difficulty moving it or using it. You may not know what position it is in without looking at it while the block is in effect.  3. For blocks in the legs and feet, returning to weight bearing and walking needs to be done carefully. You will need to wait until the numbness is entirely gone  and the strength has returned. You should be able to move your leg and foot normally before you try and bear weight or walk. You will need someone to be with you when you first try to ensure you do not fall and possibly risk injury.  4. Bruising and tenderness at the needle site are common side effects and will resolve in a few days.  5. Persistent numbness or new problems with movement should be communicated to the surgeon or the [facility] [phone])/ [facility] [phone]).    Post Anesthesia Home Care Instructions  Activity: Get plenty of rest for the remainder of the day. A responsible adult should stay with you for 24 hours following the procedure.  For the next 24 hours, DO NOT: -Drive a car -Advertising copywriterperate machinery -Drink alcoholic beverages -Take any medication unless instructed by your physician -Make any legal decisions or sign important papers.  Meals: Start with liquid foods such as gelatin or soup. Progress to regular foods as tolerated. Avoid greasy, spicy, heavy foods. If nausea and/or vomiting occur, drink only clear liquids until the nausea and/or vomiting subsides. Call your physician if vomiting continues.  Special Instructions/Symptoms: Your throat may feel dry or sore from the anesthesia or the breathing tube placed in your throat during surgery. If this causes discomfort, gargle with warm salt water. The discomfort should disappear within 24 hours.

## 2014-04-15 NOTE — Anesthesia Procedure Notes (Signed)
Procedure Name: Intubation Date/Time: 04/15/2014 10:19 AM Performed by: Sixto Bowdish D Pre-anesthesia Checklist: Patient identified, Emergency Drugs available, Suction available and Patient being monitored Patient Re-evaluated:Patient Re-evaluated prior to inductionOxygen Delivery Method: Circle System Utilized Preoxygenation: Pre-oxygenation with 100% oxygen Intubation Type: IV induction Ventilation: Mask ventilation without difficulty Grade View: Grade I Tube type: Oral Tube size: 7.0 mm Number of attempts: 1 Airway Equipment and Method: Stylet and Video-laryngoscopy Placement Confirmation: ETT inserted through vocal cords under direct vision,  positive ETCO2 and breath sounds checked- equal and bilateral Secured at: 23 cm Tube secured with: Tape Dental Injury: Teeth and Oropharynx as per pre-operative assessment  Difficulty Due To: Difficulty was anticipated Comments: Patient with large short neck and large tongue, previous intubation grade 3 view, discussion with Dr Ivin Bootyrews - decision utilize glide scope first. DL x 1 oral pharynx appears to have thrush, large amount of redundant tissue, VC visualized 7.0 ETT placed atraumatic , +ETCO2, teeth unchanged, BBS with cuff leak, changed tube due tocuff damage, teeth unchanged +BBS, + ETCO2

## 2014-04-15 NOTE — Interval H&P Note (Signed)
History and Physical Interval Note:  04/15/2014 7:26 AM  Christian Bowen  has presented today for surgery, with the diagnosis of RIGHT SHOULDER IMPINGEMENT  The various methods of treatment have been discussed with the patient and family. After consideration of risks, benefits and other options for treatment, the patient has consented to  Procedure(s): RIGHT SHOULDER SUBACROMIAL OPEN DISTAL CLAVICLE RESECTION (Right) as a surgical intervention .  [name] history has been reviewed, patient examined, no change in status, stable for surgery.  I have reviewed the patient's chart and labs.  Questions were answered to the patient's satisfaction.     [name],W D

## 2014-04-15 NOTE — Anesthesia Preprocedure Evaluation (Signed)
Anesthesia Evaluation  Patient identified by MRN, date of birth, ID band Patient awake    Reviewed: Allergy & Precautions, NPO status , Patient's Chart, lab work & pertinent test results  Airway Mallampati: I  TM Distance: >3 FB Neck ROM: Full    Dental  (+) Teeth Intact, Dental Advisory Given   Pulmonary sleep apnea (Does not use CPAP at home) , Current Smoker,  breath sounds clear to auscultation        Cardiovascular hypertension, Pt. on medications Rhythm:Regular Rate:Normal     Neuro/Psych    GI/Hepatic   Endo/Other  diabetes, Well Controlled, Type 2, Oral Hypoglycemic AgentsMorbid obesity  Renal/GU      Musculoskeletal   Abdominal   Peds  Hematology   Anesthesia Other Findings   Reproductive/Obstetrics                             Anesthesia Physical Anesthesia Plan  ASA: III  Anesthesia Plan: General   Post-op Pain Management:    Induction: Intravenous  Airway Management Planned: Oral ETT and Video Laryngoscope Planned  Additional Equipment:   Intra-op Plan:   Post-operative Plan: Extubation in OR  Informed Consent: I have reviewed the patients History and Physical, chart, labs and discussed the procedure including the risks, benefits and alternatives for the proposed anesthesia with the patient or authorized representative who has indicated his/her understanding and acceptance.   Dental advisory given  Plan Discussed with: CRNA and Anesthesiologist  Anesthesia Plan Comments:         Anesthesia Quick Evaluation

## 2014-04-16 DIAGNOSIS — M7541 Impingement syndrome of right shoulder: Secondary | ICD-10-CM | POA: Diagnosis not present

## 2014-04-18 ENCOUNTER — Encounter (HOSPITAL_BASED_OUTPATIENT_CLINIC_OR_DEPARTMENT_OTHER): Payer: Self-pay | Admitting: Orthopedic Surgery

## 2014-04-18 NOTE — Op Note (Signed)
NAME:  Sandrea HammondSHERRILL, Shavon             ACCOUNT NO.:  [id number]  MEDICAL RECORD NO.:  [id number]  LOCATION:                                 FACILITY:  PHYSICIAN:  Dyke BrackettW. D. Rhiana Morash, M.D.    DATE OF BIRTH:  Feb 16, 1960  DATE OF PROCEDURE:  04/15/2014 DATE OF DISCHARGE:  04/15/2014                              OPERATIVE REPORT   INDICATIONS:  A 54 year old status post shoulder decompression with residual popping, catching thought to be consistent [facility].  It is advised that arthroscopy with possible open acromioplasty distal clavicle was indicated as an outpatient.  PREOPERATIVE DIAGNOSES: 1. Acromioclavicular joint arthritis. 2. Mild residual impingement. 3. Significant anterior superior labral tear.  POSTOPERATIVE DIAGNOSES: 1. Acromioclavicular joint arthritis. 2. Mild residual impingement. 3. Significant anterior superior labral tear.  OPERATION: 1. Arthroscopic debridement (extensive). 2. Tenotomy biceps. 3. Open acromioplasty. 4. Open distal clavicle off the right shoulder.  SURGEON:  Dyke BrackettW. D. Kamiryn Bezanson, M.D.  ASSISTANT:  Margart SicklesJoshua Chadwell, PA-C.  ANESTHESIA:  General with nerve block.  DESCRIPTION OF PROCEDURE:  After general anesthetic nerve block, he was placed in a beach chair.  Arthroscopy showed the undersurface of the cuff looked good.  Had extensive tear in the anterior superior labrum with involvement of probably 50% of the biceps substance consistent with an unrepairable SLAP lesion.  In view of this, we did a tenotomy and an aggressive debridement as well.  Subacromial space, we decided at that point to convert this to an open procedure for an open distal clavicle with an incision bisecting the AC acromial interval, size 1 cm to 1.5 cm distal clavicle small residual edge of the acromion was resected.  We then irrigated, closed the deltoid with #1 Tycron, subcutaneous tissues with 2-0 Vicryl, skin with Monocryl.  Lightly compressive sterile dressing  applied.  Taken to the recovery room in stable condition.     Dyke BrackettW. D. Janki Dike, M.D.     WDC/MEDQ  D:  04/15/2014  T:  04/16/2014  Job:  [id number]

## 2014-04-21 ENCOUNTER — Encounter (HOSPITAL_BASED_OUTPATIENT_CLINIC_OR_DEPARTMENT_OTHER): Payer: Self-pay | Admitting: Orthopedic Surgery

## 2014-04-26 ENCOUNTER — Ambulatory Visit: Payer: Medicare Other | Admitting: Internal Medicine

## 2014-08-17 ENCOUNTER — Encounter (HOSPITAL_COMMUNITY): Payer: Self-pay | Admitting: Emergency Medicine

## 2014-08-17 ENCOUNTER — Emergency Department (INDEPENDENT_AMBULATORY_CARE_PROVIDER_SITE_OTHER)
Admission: EM | Admit: 2014-08-17 | Discharge: 2014-08-17 | Disposition: A | Discharge status: Home / Self Care | Payer: Medicare Other | Source: Home / Self Care | Attending: Family Medicine | Admitting: Family Medicine

## 2014-08-17 DIAGNOSIS — M542 Cervicalgia: Secondary | ICD-10-CM | POA: Diagnosis not present

## 2014-08-17 MED ORDER — DICLOFENAC SODIUM 75 MG PO TBEC: 75.0000 mg | DELAYED_RELEASE_TABLET | Freq: Two times a day (BID) | ORAL | Status: DC

## 2014-08-17 MED ORDER — KETOROLAC TROMETHAMINE 60 MG/2ML IM SOLN
INTRAMUSCULAR | Status: AC
Start: 2014-08-17 — End: 2014-08-17
  Filled 2014-08-17: qty 2

## 2014-08-17 MED ORDER — HYDROCODONE-ACETAMINOPHEN 5-325 MG PO TABS: 2.0000 | ORAL_TABLET | ORAL | Status: DC | PRN

## 2014-08-17 MED ORDER — KETOROLAC TROMETHAMINE 60 MG/2ML IM SOLN
60.0000 mg | Freq: Once | INTRAMUSCULAR | Status: AC
  Administered 2014-08-17: 60 mg via INTRAMUSCULAR

## 2014-08-17 NOTE — ED Provider Notes (Signed)
CSN: [id number]     Arrival date & time 08/17/14  1811 History   First MD Initiated Contact with Patient 08/17/14 1909     Chief Complaint  Patient presents with  . Neck Pain   (Consider location/radiation/quality/duration/timing/severity/associated sxs/prior Treatment) Patient is a 54 y.o. male presenting with neck injury. The history is provided by the patient. No language interpreter was used.  Neck Injury This is a new problem. The current episode started more than 2 days ago. The problem occurs constantly. The problem has not changed since onset.Associated symptoms include headaches. Nothing aggravates the symptoms. Nothing relieves the symptoms. He has tried nothing for the symptoms. The treatment provided no relief.  Pt complains of pain in his neck. Pt has seen Dr. Venetia Maxon for the same.  Pt plans to see him again  Past Medical History  Diagnosis Date  . Hypertension   . Diabetes mellitus   . Asthma   . Obesity   . Sleep apnea     CPAP-has had UPPP-tonsils and turb red  . Apnea, sleep     was tested 2005  . Lumbar post-laminectomy syndrome 04/02/2013  . Tobacco abuse 03/13/2013  . Hyperlipemia 10/12/2013  . Pain in joint, shoulder region 10/12/2013   Past Surgical History  Procedure Laterality Date  . Heel spurs      bilateral feel  . Spinal cord stimulator implant  1997  . Back surgery  2009  . Nasal turbinate reduction  8/13    septoplasty-UPPP-tonsils  . Ganglion cyst excision  2/14-SCG    lt wrist   . Tonsillectomy    . Dorsal compartment release Left 08/13/2012    Procedure: RELEASE DORSAL COMPARTMENT (DEQUERVAIN) and exploration of radial nerve branch;  Surgeon: Tami Ribas, MD;  Location: [facility];  Service: Orthopedics;  Laterality: Left;  . Ganglion cyst excision Left 08/13/2012    Procedure: REMOVAL GANGLION OF WRIST;  Surgeon: Tami Ribas, MD;  Location: [facility];  Service: Orthopedics;  Laterality: Left;  . Spinal cord  stimulator insertion N/A 04/02/2013    Procedure: LUMBAR SPINAL CORD STIMULATOR Insertion;  Surgeon: Gwynne Edinger, MD;  Location: [facility];  Service: Neurosurgery;  Laterality: N/A;  . Shoulder arthroscopy Right 07/02/2013    Procedure: RIGHT SHOULDER ARTHROSCOPY WITH EXTENSIVE DEBRIDEMENT OF ROTATOR CUFF AND LABRUM, SUBACROMIAL DECOMPRESSION, PARTIAL ACROMIOPLASTY WITH CORACOACROMIAL RELEASE;  Surgeon: Thera Flake., MD;  Location: [facility];  Service: Orthopedics;  Laterality: Right;  . Shoulder arthroscopy with bicepstenotomy Right 04/15/2014    Procedure: SHOULDER ARTHROSCOPY WITH BICEPSTENOTOMY;  Surgeon: Frederico Hamman, MD;  Location: [facility];  Service: Orthopedics;  Laterality: Right;   Family History  Problem Relation Age of Onset  . Hypertension Mother   . Heart attack Father    History  Substance Use Topics  . Smoking status: Current Some Day Smoker -- 0.25 packs/day for 30 years    Types: Cigarettes  . Smokeless tobacco: Never Used     Comment: 1 pack will last him 3-4 weeks  . Alcohol Use: Yes     Comment: 1 pint over the weekend...its not all the time    Review of Systems  Neurological: Positive for headaches.  All other systems reviewed and are negative.   Allergies  Hydromorphone hcl  Home Medications   Prior to Admission medications   Medication Sig Start Date End Date Taking? Authorizing Provider  acetaminophen (TYLENOL) 500 MG tablet Take 1,000 mg  by mouth every 6 (six) hours as needed for moderate pain.    Historical Provider, MD  albuterol (PROVENTIL HFA;VENTOLIN HFA) 108 (90 BASE) MCG/ACT inhaler Inhale 2 puffs into the lungs every 6 (six) hours as needed for wheezing or shortness of breath.     Historical Provider, MD  albuterol (PROVENTIL) (2.5 MG/3ML) 0.083% nebulizer solution Take 3 mLs (2.5 mg total) by nebulization every 6 (six) hours as needed for wheezing or shortness of breath. 06/21/13   Waymon Budgelinton D Young, MD   atorvastatin (LIPITOR) 40 MG tablet Take 40 mg by mouth daily.    Historical Provider, MD  Fluticasone Furoate-Vilanterol 100-25 MCG/INH AEPB 1 puff then rinse mouth, one time daily 10/12/13   Waymon Budgelinton D Young, MD  glimepiride (AMARYL) 4 MG tablet Take 4 mg by mouth daily with breakfast.    Historical Provider, MD  ibuprofen (ADVIL,MOTRIN) 200 MG tablet Take 400 mg by mouth every 6 (six) hours as needed for moderate pain.    Historical Provider, MD  lisinopril-hydrochlorothiazide (PRINZIDE,ZESTORETIC) 20-25 MG per tablet Take 1 tablet by mouth daily.    Historical Provider, MD  methocarbamol (ROBAXIN-750) 750 MG tablet Take 1 tablet (750 mg total) by mouth 4 (four) times daily. 04/15/14   Margart SicklesJoshua Chadwell, PA-C  oxyCODONE-acetaminophen (PERCOCET) 10-325 MG per tablet Take 1 tablet by mouth every 4 (four) hours as needed for pain. 04/15/14   Joshua Chadwell, PA-C  sitaGLIPtan-metformin (JANUMET) 50-1000 MG per tablet Take 1 tablet by mouth 2 (two) times daily with a meal.    Historical Provider, MD   BP 141/97 mmHg  Pulse 111  Temp(Src) 98.1 F (36.7 C) (Oral)  Resp 20  SpO2 95% Physical Exam  Constitutional: He appears well-developed and well-nourished.  HENT:  Head: Normocephalic and atraumatic.  Eyes: Pupils are equal, round, and reactive to light.  Neck: Normal range of motion.  Tender cerviacl spine diffusely, from with pain,  Worse to the left, nv and ns intact  Cardiovascular: Normal rate.   Pulmonary/Chest: Effort normal.  Abdominal: Soft.  Musculoskeletal: Normal range of motion.  Neurological: He is alert.  Skin: Skin is warm.  Psychiatric: He has a normal mood and affect.  Nursing note and vitals reviewed.   ED Course  Procedures (including critical care time) Labs Review Labs Reviewed - No data to display  Imaging Review No results found.   MDM   1. Neck pain    Meds ordered this encounter  Medications  . ketorolac (TORADOL) injection 60 mg    Sig:   .  HYDROcodone-acetaminophen (NORCO/VICODIN) 5-325 MG per tablet    Sig: Take 2 tablets by mouth every 4 (four) hours as needed.    Dispense:  16 tablet    Refill:  0    Order Specific Question:  Supervising Provider    Answer:  Linna HoffKINDL, JAMES D [phone][[id number]]  . diclofenac (VOLTAREN) 75 MG EC tablet    Sig: Take 1 tablet (75 mg total) by mouth 2 (two) times daily.    Dispense:  20 tablet    Refill:  0    Order Specific Question:  Supervising Provider    Answer:  Bradd CanaryKINDL, JAMES D [[id number]]    Lonia SkinnerLeslie K ElginSofia, PA-C 08/18/14 1322

## 2014-08-17 NOTE — ED Notes (Signed)
Neck pain for 2 months.  No known injury.  Has been told he has bone spurs and arthritis.  Saw dr Venetia Maxonstern and treated with flexeril, tramadol.  Patient reports treatment did work until about 2 days ago.  Over the past 2 days, pain has been increasing in severity.

## 2014-08-17 NOTE — Discharge Instructions (Signed)

## 2014-09-25 ENCOUNTER — Emergency Department (INDEPENDENT_AMBULATORY_CARE_PROVIDER_SITE_OTHER)
Admission: EM | Admit: 2014-09-25 | Discharge: 2014-09-25 | Disposition: A | Discharge status: Home / Self Care | Payer: Medicare Other | Source: Home / Self Care | Attending: Emergency Medicine | Admitting: Emergency Medicine

## 2014-09-25 ENCOUNTER — Encounter (HOSPITAL_COMMUNITY): Payer: Self-pay | Admitting: Emergency Medicine

## 2014-09-25 DIAGNOSIS — J309 Allergic rhinitis, unspecified: Secondary | ICD-10-CM

## 2014-09-25 MED ORDER — FLUTICASONE PROPIONATE 50 MCG/ACT NA SUSP: 2.0000 | Freq: Every day | NASAL | Status: DC

## 2014-09-25 MED ORDER — CETIRIZINE HCL 10 MG PO TABS: 10.0000 mg | ORAL_TABLET | Freq: Every day | ORAL | Status: DC

## 2014-09-25 NOTE — ED Provider Notes (Signed)
CSN: [id number]     Arrival date & time 09/25/14  1707 History   First MD Initiated Contact with Patient 09/25/14 1731     Chief Complaint  Patient presents with  . Sinusitis   (Consider location/radiation/quality/duration/timing/severity/associated sxs/prior Treatment) HPI He is a 54 year old man here for evaluation of nasal congestion. He states for the last week or 2 he has had a stuffy nose. This is associated with nasal itching. He also reports some intermittent itchy eyes. He has had some sneezing. No cough or shortness of breath. No significant postnasal drainage. He has tried Benadryl and nasal saline spray without improvement. He states the congestion gets worse at night, to the point where he is unable to sleep lying down causes of his stuffy nose. He does have asthma, but denies any history of allergies.  Past Medical History  Diagnosis Date  . Hypertension   . Diabetes mellitus   . Asthma   . Obesity   . Sleep apnea     CPAP-has had UPPP-tonsils and turb red  . Apnea, sleep     was tested 2005  . Lumbar post-laminectomy syndrome 04/02/2013  . Tobacco abuse 03/13/2013  . Hyperlipemia 10/12/2013  . Pain in joint, shoulder region 10/12/2013   Past Surgical History  Procedure Laterality Date  . Heel spurs      bilateral feel  . Spinal cord stimulator implant  1997  . Back surgery  2009  . Nasal turbinate reduction  8/13    septoplasty-UPPP-tonsils  . Ganglion cyst excision  2/14-SCG    lt wrist   . Tonsillectomy    . Dorsal compartment release Left 08/13/2012    Procedure: RELEASE DORSAL COMPARTMENT (DEQUERVAIN) and exploration of radial nerve branch;  Surgeon: Tami Ribas, MD;  Location: [facility];  Service: Orthopedics;  Laterality: Left;  . Ganglion cyst excision Left 08/13/2012    Procedure: REMOVAL GANGLION OF WRIST;  Surgeon: Tami Ribas, MD;  Location: [facility];  Service: Orthopedics;  Laterality: Left;  . Spinal cord stimulator  insertion N/A 04/02/2013    Procedure: LUMBAR SPINAL CORD STIMULATOR Insertion;  Surgeon: Gwynne Edinger, MD;  Location: [facility];  Service: Neurosurgery;  Laterality: N/A;  . Shoulder arthroscopy Right 07/02/2013    Procedure: RIGHT SHOULDER ARTHROSCOPY WITH EXTENSIVE DEBRIDEMENT OF ROTATOR CUFF AND LABRUM, SUBACROMIAL DECOMPRESSION, PARTIAL ACROMIOPLASTY WITH CORACOACROMIAL RELEASE;  Surgeon: Thera Flake., MD;  Location: [facility];  Service: Orthopedics;  Laterality: Right;  . Shoulder arthroscopy with bicepstenotomy Right 04/15/2014    Procedure: SHOULDER ARTHROSCOPY WITH BICEPSTENOTOMY;  Surgeon: Frederico Hamman, MD;  Location: [facility];  Service: Orthopedics;  Laterality: Right;   Family History  Problem Relation Age of Onset  . Hypertension Mother   . Heart attack Father    Social History  Substance Use Topics  . Smoking status: Current Some Day Smoker -- 0.25 packs/day for 30 years    Types: Cigarettes  . Smokeless tobacco: Never Used     Comment: 1 pack will last him 3-4 weeks  . Alcohol Use: Yes     Comment: 1 pint over the weekend...its not all the time    Review of Systems As in history of present illness Allergies  Hydromorphone hcl  Home Medications   Prior to Admission medications   Medication Sig Start Date End Date Taking? Authorizing Provider  acetaminophen (TYLENOL) 500 MG tablet Take 1,000 mg by mouth every 6 (six) hours as  needed for moderate pain.    Historical Provider, MD  albuterol (PROVENTIL HFA;VENTOLIN HFA) 108 (90 BASE) MCG/ACT inhaler Inhale 2 puffs into the lungs every 6 (six) hours as needed for wheezing or shortness of breath.     Historical Provider, MD  albuterol (PROVENTIL) (2.5 MG/3ML) 0.083% nebulizer solution Take 3 mLs (2.5 mg total) by nebulization every 6 (six) hours as needed for wheezing or shortness of breath. 06/21/13   Waymon Budge, MD  atorvastatin (LIPITOR) 40 MG tablet Take 40 mg by mouth daily.     Historical Provider, MD  cetirizine (ZYRTEC) 10 MG tablet Take 1 tablet (10 mg total) by mouth daily. 09/25/14   Charm Rings, MD  diclofenac (VOLTAREN) 75 MG EC tablet Take 1 tablet (75 mg total) by mouth 2 (two) times daily. 08/17/14   Elson Areas, PA-C  fluticasone [facility]) 50 MCG/ACT nasal spray Place 2 sprays into both nostrils daily. 09/25/14   Charm Rings, MD  Fluticasone Furoate-Vilanterol 100-25 MCG/INH AEPB 1 puff then rinse mouth, one time daily 10/12/13   Waymon Budge, MD  glimepiride (AMARYL) 4 MG tablet Take 4 mg by mouth daily with breakfast.    Historical Provider, MD  HYDROcodone-acetaminophen (NORCO/VICODIN) 5-325 MG per tablet Take 2 tablets by mouth every 4 (four) hours as needed. 08/17/14   Elson Areas, PA-C  ibuprofen (ADVIL,MOTRIN) 200 MG tablet Take 400 mg by mouth every 6 (six) hours as needed for moderate pain.    Historical Provider, MD  lisinopril-hydrochlorothiazide (PRINZIDE,ZESTORETIC) 20-25 MG per tablet Take 1 tablet by mouth daily.    Historical Provider, MD  methocarbamol (ROBAXIN-750) 750 MG tablet Take 1 tablet (750 mg total) by mouth 4 (four) times daily. 04/15/14   Margart Sickles, PA-C  oxyCODONE-acetaminophen (PERCOCET) 10-325 MG per tablet Take 1 tablet by mouth every 4 (four) hours as needed for pain. 04/15/14   Margart Sickles, PA-C  sitaGLIPtan-metformin (JANUMET) 50-1000 MG per tablet Take 1 tablet by mouth 2 (two) times daily with a meal.    Historical Provider, MD   BP 117/81 mmHg  Pulse 97  Temp(Src) 98 F (36.7 C) (Oral)  Resp 18  SpO2 95% Physical Exam  Constitutional: He is oriented to person, place, and time. He appears well-developed and well-nourished. No distress.  HENT:  Mouth/Throat: Oropharynx is clear and moist. No oropharyngeal exudate.  Nasal mucosa is pale and edematous. TMs are normal bilaterally.  Eyes: Conjunctivae are normal.  Neck: Neck supple.  Cardiovascular: Normal rate, regular rhythm and normal heart sounds.   No  murmur heard. Pulmonary/Chest: Effort normal and breath sounds normal. No respiratory distress. He has no wheezes. He has no rales.  Lymphadenopathy:    He has no cervical adenopathy.  Neurological: He is alert and oriented to person, place, and time.    ED Course  Procedures (including critical care time) Labs Review Labs Reviewed - No data to display  Imaging Review No results found.   MDM   1. Allergic rhinitis, unspecified allergic rhinitis type    Treat with Flonase and cetirizine. Discussed over-the-counter Afrin for no more than 3 days. Follow-up as needed.    Charm Rings, MD 09/25/14 [phone]

## 2014-09-25 NOTE — ED Notes (Signed)
C/o sinus problem States he is nose is congested and he is sneezing Tried benadryl and nasal spray but no relief States feels as if nasal passage is swollen

## 2014-09-25 NOTE — Discharge Instructions (Signed)

## 2014-10-25 ENCOUNTER — Other Ambulatory Visit: Payer: Self-pay | Admitting: Neurosurgery

## 2014-10-25 DIAGNOSIS — M542 Cervicalgia: Secondary | ICD-10-CM

## 2014-10-28 ENCOUNTER — Other Ambulatory Visit: Payer: Medicare Other

## 2014-10-28 ENCOUNTER — Ambulatory Visit
Admission: RE | Admit: 2014-10-28 | Discharge: 2014-10-28 | Disposition: A | Discharge status: Home / Self Care | Payer: Medicare Other | Source: Ambulatory Visit | Attending: Neurosurgery | Admitting: Neurosurgery

## 2014-10-28 DIAGNOSIS — M542 Cervicalgia: Secondary | ICD-10-CM

## 2014-11-09 ENCOUNTER — Ambulatory Visit (INDEPENDENT_AMBULATORY_CARE_PROVIDER_SITE_OTHER): Payer: Medicare Other | Admitting: Internal Medicine

## 2014-11-09 ENCOUNTER — Encounter: Payer: Self-pay | Admitting: Internal Medicine

## 2014-11-09 ENCOUNTER — Telehealth: Payer: Self-pay | Admitting: Internal Medicine

## 2014-11-09 VITALS — BP 130/74 | HR 86 | Ht 72.0 in | Wt 314.8 lb

## 2014-11-09 DIAGNOSIS — Z720 Tobacco use: Secondary | ICD-10-CM

## 2014-11-09 DIAGNOSIS — J4541 Moderate persistent asthma with (acute) exacerbation: Secondary | ICD-10-CM

## 2014-11-09 DIAGNOSIS — G4733 Obstructive sleep apnea (adult) (pediatric): Secondary | ICD-10-CM | POA: Diagnosis not present

## 2014-11-09 MED ORDER — FLUTICASONE FUROATE-VILANTEROL 100-25 MCG/INH IN AEPB: INHALATION_SPRAY | RESPIRATORY_TRACT | Status: DC

## 2014-11-09 MED ORDER — ALBUTEROL SULFATE HFA 108 (90 BASE) MCG/ACT IN AERS: 2.0000 | INHALATION_SPRAY | Freq: Four times a day (QID) | RESPIRATORY_TRACT | Status: DC | PRN

## 2014-11-09 MED ORDER — ALBUTEROL SULFATE (2.5 MG/3ML) 0.083% IN NEBU: 2.5000 mg | INHALATION_SOLUTION | Freq: Four times a day (QID) | RESPIRATORY_TRACT | Status: DC | PRN

## 2014-11-09 NOTE — Assessment & Plan Note (Signed)
Light smoker but I've encouraged him to stop completely

## 2014-11-09 NOTE — Progress Notes (Signed)
02/16/13- 65 yoM smoker referred courtesy of Dr Windle Guard-  c/o snores alot,does not sleep well,has had CPAP 2005 off for 5 yrs. after moving,back on x 1 yr. Now, pressure. not good- too weak NPSG done 10 years ago- searching paper chart. 09/23/11- Dr Marvetta Gibbons- Surgery- UPPP/ turbinate reduction/ septoplasty. Has gained a great deal of weight since original sleep study. Bedtime 1 AM, sleep latency 60-90 minutes, awakening 3 or 4 times during the night before up at 7:30 AM. Thinks current CPAP pressure may be around 8. Asthma treated with albuterol nebulizer. Continues to smoke one quarter pack per day. Now has an incidental chest cold with some wheeze for the past week. Hypertension without cardiac disease.  05/04/13- 52 yoM smoker followed for OSA, s/p UPPP, tobacco abuse NPSG done 10 years ago follows for:   pt had his PFT done today.  here to review the results.  pt brought his cpap in today. Admits occasional cigarette. Counseled again. Continues CPAP 8 (?)/[facility]. Going to gym now trying to lose weight CXR 03/26/13 IMPRESSION:  No active disease.  Electronically Signed  By: Irish Lack M.D.  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 FVC 3.52/78%, FEV1 2.56/72%, FEV1/FVC 0.73/91%, FEF 25-75% 1.74/51%. TLC 73%, DLCO 108%.  10/12/13-53 yoM smoker followed for OSA, s/p UPPP, tobacco abuse NPSG done 10 years ago Follows For: Chest congestion - Non prod cough - Chest tighness - SOB - Wheezing for past 2-3 weeks , flu shot 2 weeks ago. Has not had a cold but has been noticing persistent chest congestion, difficult to bring anything up. Using his nebulizer or inhaler. Says prednisone helped last visit. Not using Breo. CPAP 16/[facility]  10/5./16- 4 yoM smoker followed for OSA, s/p UPPP, tobacco abuse NPSG done 10 years ago-original report not yet found CPAP 16/[facility] He  likes Breo inhaler well, only needing occasional rescue inhaler. CPAP does well but causing dry mouth which we discussed. 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  ROS-see HPI Constitutional:   No-   weight loss, night sweats, fevers, chills, +fatigue, lassitude. HEENT:   No-  headaches, difficulty swallowing, tooth/dental problems, sore throat,       No-  sneezing, itching, ear ache, nasal congestion, post nasal drip,  CV:  No-   chest pain, orthopnea, PND, swelling in lower extremities, anasarca,                                                          dizziness, palpitations Resp: No-   shortness of breath with exertion or at rest.              No-   productive cough,  No non-productive cough,  No- coughing up of blood.              No-   change in color of mucus.  + wheezing.   Skin: No-   rash or lesions. GI:  No-   heartburn, indigestion, abdominal pain, nausea, vomiting,  GU:  MS:  No-   joint pain or swelling.    + back pain. Neuro-     nothing unusual Psych:  No- change in mood or affect. No depression or anxiety.  No  memory loss.  OBJ- Physical Exam General- Alert, Oriented, Affect-appropriate, Distress- none acute, +obese Skin- rash-none, lesions- none, excoriation- none Lymphadenopathy- none Head- atraumatic            Eyes- Gross vision intact, PERRLA, conjunctivae and secretions clear            Ears- Hearing, canals-normal            Nose- Clear, no-Septal dev, mucus, polyps, erosion, perforation             Throat- Mallampati II/ UPPP , mucosa clear , drainage- none, tonsils- atrophic Neck- flexible , trachea midline, no stridor , thyroid nl, carotid no bruit Chest - symmetrical excursion , unlabored           Heart/CV- RRR , no murmur , no gallop  , no rub, nl s1 s2                           - JVD- none , edema- none, stasis changes- none, varices- none           Lung- , clear, wheeze-none unlabored, cough-  none , dullness-none, rub- none           Chest wall-  Abd-  Br/ Gen/ Rectal- Not done, not indicated Extrem- cyanosis- none, clubbing, none, atrophy- none, strength- nl Neuro- grossly intact to observation

## 2014-11-09 NOTE — Telephone Encounter (Signed)
Last ov w/ CY was 9.8.15, pt was rec'd to follow up in 6 months Pt not seen  Called spoke with patient who reported that he would like a refill on the Breo (pt was mistaken when he called asking for Advair) - pt stated that Breo controlled his symptoms well.  Advised pt that he is overdue for appt with CY.  Appt scheduled for this morning at 1045 (pt did not want to wait for CY's first available at the end of the month).  Since pt already scheduled appt with CY, will go ahead and refill the [facility] with 5 additional refills to verified pharmacy.  Nothing further needed at this time, will sign off.

## 2014-11-09 NOTE — Assessment & Plan Note (Signed)
Plan-stop smoking. Refills for nebulizer solution, Breo, rescue albuterol

## 2014-11-09 NOTE — Patient Instructions (Addendum)
Order- [facility] add AirView to CPAP      Dx OSA  Order- [facility] download CPAP      Refill scripts sent for nebulizer solution, albutrerol rescue inhaler, Breo  Please call as needed

## 2014-11-09 NOTE — Assessment & Plan Note (Signed)
He seems to be doing very well with CPAP 16/[facility]. We will try to get a download for documentation. Weight loss is advised.

## 2014-11-09 NOTE — Assessment & Plan Note (Signed)
Offering bariatric referral and strongly encouraging him to make some effort to lose weight

## 2014-11-28 ENCOUNTER — Other Ambulatory Visit: Payer: Self-pay | Admitting: *Deleted

## 2014-11-28 DIAGNOSIS — M79672 Pain in left foot: Secondary | ICD-10-CM

## 2014-11-28 DIAGNOSIS — M79671 Pain in right foot: Secondary | ICD-10-CM

## 2014-12-27 ENCOUNTER — Telehealth: Payer: Self-pay | Admitting: *Deleted

## 2014-12-27 NOTE — Telephone Encounter (Signed)
[facility] Key: [id number] Sent for review.

## 2014-12-28 NOTE — Telephone Encounter (Signed)
PA still in process. Reports it can take up to 5 business days for approval/denial

## 2015-01-02 NOTE — Telephone Encounter (Signed)
Received paper from [facility] stating [facility] BouquetBreo is covered. Called to pharmacy and they state they were trying to double bill. Breo went through and they state they will contact pt when ready. Nothing further needed.

## 2015-01-04 ENCOUNTER — Encounter: Payer: Self-pay | Admitting: Vascular Surgery

## 2015-01-06 ENCOUNTER — Inpatient Hospital Stay (HOSPITAL_COMMUNITY): Admission: RE | Admit: 2015-01-06 | Payer: Medicare Other | Source: Ambulatory Visit

## 2015-01-06 ENCOUNTER — Encounter: Payer: Medicare Other | Admitting: Vascular Surgery

## 2015-01-12 ENCOUNTER — Encounter: Payer: Self-pay | Admitting: Vascular Surgery

## 2015-01-12 ENCOUNTER — Ambulatory Visit (HOSPITAL_COMMUNITY)
Admission: RE | Admit: 2015-01-12 | Discharge: 2015-01-12 | Disposition: A | Discharge status: Home / Self Care | Payer: Medicare Other | Source: Ambulatory Visit | Attending: Vascular Surgery | Admitting: Vascular Surgery

## 2015-01-12 ENCOUNTER — Ambulatory Visit (INDEPENDENT_AMBULATORY_CARE_PROVIDER_SITE_OTHER): Payer: Medicare Other | Admitting: Vascular Surgery

## 2015-01-12 VITALS — BP 121/84 | HR 91 | Temp 98.1°F | Resp 18 | Ht 72.0 in | Wt 308.0 lb

## 2015-01-12 DIAGNOSIS — M25561 Pain in right knee: Secondary | ICD-10-CM | POA: Diagnosis not present

## 2015-01-12 DIAGNOSIS — M79672 Pain in left foot: Secondary | ICD-10-CM

## 2015-01-12 DIAGNOSIS — M25562 Pain in left knee: Secondary | ICD-10-CM | POA: Diagnosis not present

## 2015-01-12 DIAGNOSIS — M79671 Pain in right foot: Secondary | ICD-10-CM

## 2015-01-12 NOTE — Progress Notes (Signed)
[facility] [place] HISTORY AND PHYSICAL   History of Present Illness:  Patient is a 54 y.o.  male who presents for evaluation of occasional random medial calf pain.  He denies claudication. He denies nonhealing wounds. He does have a history of diabetes for several years. He also smokes about 1-2 cigarettes per week. Other medical problems include hypertension, diabetes, obesity, sleep apnea and hyperlipidemia all of which are currently stable  Past Medical History  Diagnosis Date  . Hypertension   . Diabetes mellitus   . Asthma   . Obesity   . Sleep apnea     CPAP-has had UPPP-tonsils and turb red  . Apnea, sleep     was tested 2005  . Lumbar post-laminectomy syndrome 04/02/2013  . Tobacco abuse 03/13/2013  . Hyperlipemia 10/12/2013  . Pain in joint, shoulder region 10/12/2013    Past Surgical History  Procedure Laterality Date  . Heel spurs      bilateral feel  . Spinal cord stimulator implant  1997  . Back surgery  2009  . Nasal turbinate reduction  8/13    septoplasty-UPPP-tonsils  . Ganglion cyst excision  2/14-SCG    lt wrist   . Tonsillectomy    . Dorsal compartment release Left 08/13/2012    Procedure: RELEASE DORSAL COMPARTMENT (DEQUERVAIN) and exploration of radial nerve branch;  Surgeon: Tami RibasKevin R Kuzma, MD;  Location: [facility];  Service: Orthopedics;  Laterality: Left;  . Ganglion cyst excision Left 08/13/2012    Procedure: REMOVAL GANGLION OF WRIST;  Surgeon: Tami RibasKevin R Kuzma, MD;  Location: [facility];  Service: Orthopedics;  Laterality: Left;  . Spinal cord stimulator insertion N/A 04/02/2013    Procedure: LUMBAR SPINAL CORD STIMULATOR Insertion;  Surgeon: Gwynne EdingerPaul C Harkins, MD;  Location: [facility];  Service: Neurosurgery;  Laterality: N/A;  . Shoulder arthroscopy Right 07/02/2013    Procedure: RIGHT SHOULDER ARTHROSCOPY WITH EXTENSIVE DEBRIDEMENT OF ROTATOR CUFF AND LABRUM, SUBACROMIAL DECOMPRESSION, PARTIAL ACROMIOPLASTY  WITH CORACOACROMIAL RELEASE;  Surgeon: Thera FlakeW D Caffrey Jr., MD;  Location: [facility];  Service: Orthopedics;  Laterality: Right;  . Shoulder arthroscopy with bicepstenotomy Right 04/15/2014    Procedure: SHOULDER ARTHROSCOPY WITH BICEPSTENOTOMY;  Surgeon: Frederico Hammananiel Caffrey, MD;  Location: [facility];  Service: Orthopedics;  Laterality: Right;    Social History Social History  Substance Use Topics  . Smoking status: Current Some Day Smoker -- 0.25 packs/day for 30 years    Types: Cigarettes  . Smokeless tobacco: Never Used     Comment: 1 pack will last him 3-4 weeks  . Alcohol Use: 0.0 oz/week    0 Standard drinks or equivalent per week     Comment: 1 pint over the weekend...its not all the time    Family History Family History  Problem Relation Age of Onset  . Hypertension Mother   . Heart attack Father     Allergies  Allergies  Allergen Reactions  . Hydromorphone Hcl     Patient went into respiratory distress     Current Outpatient Prescriptions  Medication Sig Dispense Refill  . albuterol (PROVENTIL HFA;VENTOLIN HFA) 108 (90 BASE) MCG/ACT inhaler Inhale 2 puffs into the lungs every 6 (six) hours as needed for wheezing or shortness of breath. 1 Inhaler prn  . albuterol (PROVENTIL) (2.5 MG/3ML) 0.083% nebulizer solution Take 3 mLs (2.5 mg total) by nebulization every 6 (six) hours as needed for wheezing or shortness of breath. 75 vial prn  .  atorvastatin (LIPITOR) 40 MG tablet Take 40 mg by mouth daily.    . cetirizine (ZYRTEC) 10 MG tablet Take 1 tablet (10 mg total) by mouth daily. 30 tablet 2  . glimepiride (AMARYL) 4 MG tablet Take 4 mg by mouth daily with breakfast.    . HYDROcodone-acetaminophen (NORCO/VICODIN) 5-325 MG per tablet Take 2 tablets by mouth every 4 (four) hours as needed. 16 tablet 0  . ibuprofen (ADVIL,MOTRIN) 200 MG tablet Take 400 mg by mouth every 6 (six) hours as needed for moderate pain.    Marland Kitchen lisinopril-hydrochlorothiazide  (PRINZIDE,ZESTORETIC) 20-25 MG per tablet Take 1 tablet by mouth daily.    . sitaGLIPtan-metformin (JANUMET) 50-1000 MG per tablet Take 1 tablet by mouth 2 (two) times daily with a meal.    . acetaminophen (TYLENOL) 500 MG tablet Take 1,000 mg by mouth every 6 (six) hours as needed for moderate pain.    Marland Kitchen diclofenac (VOLTAREN) 75 MG EC tablet Take 1 tablet (75 mg total) by mouth 2 (two) times daily. (Patient not taking: Reported on 01/12/2015) 20 tablet 0  . fluticasone (FLONASE) 50 MCG/ACT nasal spray Place 2 sprays into both nostrils daily. (Patient not taking: Reported on 01/12/2015) 16 g 2  . Fluticasone Furoate-Vilanterol 100-25 MCG/INH AEPB 1 puff then rinse mouth, one time daily (Patient not taking: Reported on 01/12/2015) 60 each prn  . methocarbamol (ROBAXIN-750) 750 MG tablet Take 1 tablet (750 mg total) by mouth 4 (four) times daily. (Patient not taking: Reported on 01/12/2015) 60 tablet 0  . oxyCODONE-acetaminophen (PERCOCET) 10-325 MG per tablet Take 1 tablet by mouth every 4 (four) hours as needed for pain. (Patient not taking: Reported on 01/12/2015) 60 tablet 0   No current facility-administered medications for this visit.    ROS:   General:  No weight loss, Fever, chills  HEENT: No recent headaches, no nasal bleeding, no visual changes, no sore throat  Neurologic: No dizziness, blackouts, seizures. No recent symptoms of stroke or mini- stroke. No recent episodes of slurred speech, or temporary blindness.  Cardiac: No recent episodes of chest pain/pressure, no shortness of breath at rest.  No shortness of breath with exertion.  Denies history of atrial fibrillation or irregular heartbeat  Vascular: No history of rest pain in feet.  No history of claudication.  No history of non-healing ulcer, No history of DVT   Pulmonary: No home oxygen, no productive cough, no hemoptysis,  No asthma or wheezing  Musculoskeletal:   Arthritis,  Low back pain,   Joint  pain  Hematologic:No history of hypercoagulable state.  No history of easy bleeding.  No history of anemia  Gastrointestinal: No hematochezia or melena,  No gastroesophageal reflux, no trouble swallowing  Urinary:  chronic Kidney disease,  on HD -  MWF or  TTHS,  Burning with urination,  Frequent urination,  Difficulty urinating;   Skin: No rashes  Psychological: No history of anxiety,  No history of depression   Physical Examination  Filed Vitals:   01/12/15 1301  BP: 121/84  Pulse: 91  Temp: 98.1 F (36.7 C)  Resp: 18  Height: 6' (1.829 m)  Weight: 308 lb (139.708 kg)  SpO2: 99%    Body mass index is 41.76 kg/(m^2).  General:  Alert and oriented, no acute distress HEENT: Normal Neck: No bruit or JVD Pulmonary: Clear to auscultation bilaterally Cardiac: Regular Rate and Rhythm without murmur Abdomen: Soft, non-tender, non-distended, no mass, no scars  Skin: No rash Extremity Pulses:  2+ radial, brachial, femoral, dorsalis pedis, absent posterior tibial pulses bilaterally Musculoskeletal: No deformity or edema  Neurologic: Upper and lower extremity motor 5/5 and symmetric  DATA:  Patient had bilateral ABIs performed today which were 1.5 on the left 1.32 on the right toe pressure greater than 100 bilaterally triphasic waveforms  ASSESSMENT: Patient with normal ABIs bilaterally and palpable pulses. No obvious large vessel disease currently. Incised patient today importance of weight loss smoking cessation for prevention of atherosclerosis long-term. He will follow-up with me on as-needed basis.  Greater than 3 minutes today spent regarding smoking cessation counseling.  Fabienne Bruns, MD [facility] [place] Office: [phone] Pager: [phone]

## 2015-08-31 ENCOUNTER — Emergency Department (HOSPITAL_COMMUNITY)
Admission: EM | Admit: 2015-08-31 | Discharge: 2015-08-31 | Disposition: A | Discharge status: Home / Self Care | Payer: Medicare Other | Attending: Emergency Medicine | Admitting: Emergency Medicine

## 2015-08-31 ENCOUNTER — Encounter (HOSPITAL_COMMUNITY): Payer: Self-pay

## 2015-08-31 DIAGNOSIS — Z79899 Other long term (current) drug therapy: Secondary | ICD-10-CM | POA: Insufficient documentation

## 2015-08-31 DIAGNOSIS — E119 Type 2 diabetes mellitus without complications: Secondary | ICD-10-CM | POA: Diagnosis not present

## 2015-08-31 DIAGNOSIS — Z7984 Long term (current) use of oral hypoglycemic drugs: Secondary | ICD-10-CM | POA: Insufficient documentation

## 2015-08-31 DIAGNOSIS — F17210 Nicotine dependence, cigarettes, uncomplicated: Secondary | ICD-10-CM | POA: Insufficient documentation

## 2015-08-31 DIAGNOSIS — I10 Essential (primary) hypertension: Secondary | ICD-10-CM | POA: Diagnosis not present

## 2015-08-31 DIAGNOSIS — M542 Cervicalgia: Secondary | ICD-10-CM | POA: Diagnosis present

## 2015-08-31 DIAGNOSIS — M62838 Other muscle spasm: Secondary | ICD-10-CM | POA: Insufficient documentation

## 2015-08-31 DIAGNOSIS — J45909 Unspecified asthma, uncomplicated: Secondary | ICD-10-CM | POA: Diagnosis not present

## 2015-08-31 DIAGNOSIS — M436 Torticollis: Secondary | ICD-10-CM | POA: Insufficient documentation

## 2015-08-31 MED ORDER — DIAZEPAM 5 MG PO TABS
5.0000 mg | ORAL_TABLET | Freq: Once | ORAL | Status: AC
  Administered 2015-08-31: 5 mg via ORAL
  Filled 2015-08-31: qty 1

## 2015-08-31 MED ORDER — OXYCODONE-ACETAMINOPHEN 5-325 MG PO TABS: 1.0000 | ORAL_TABLET | ORAL | 0 refills | Status: DC | PRN

## 2015-08-31 MED ORDER — KETOROLAC TROMETHAMINE 15 MG/ML IJ SOLN
15.0000 mg | Freq: Once | INTRAMUSCULAR | Status: AC
  Administered 2015-08-31: 15 mg via INTRAMUSCULAR
  Filled 2015-08-31: qty 1

## 2015-08-31 MED ORDER — MORPHINE SULFATE (PF) 4 MG/ML IV SOLN
6.0000 mg | Freq: Once | INTRAVENOUS | Status: AC
  Administered 2015-08-31: 6 mg via INTRAMUSCULAR
  Filled 2015-08-31: qty 2

## 2015-08-31 MED ORDER — DEXAMETHASONE 4 MG PO TABS
12.0000 mg | ORAL_TABLET | Freq: Once | ORAL | Status: AC
  Administered 2015-08-31: 12 mg via ORAL
  Filled 2015-08-31: qty 3

## 2015-08-31 MED ORDER — DIAZEPAM 5 MG PO TABS: 5.0000 mg | ORAL_TABLET | Freq: Three times a day (TID) | ORAL | 0 refills | Status: DC | PRN

## 2015-08-31 MED ORDER — BUPIVACAINE HCL 0.25 % IJ SOLN: 5.0000 mL | Freq: Once | INTRAMUSCULAR | Status: DC

## 2015-08-31 MED ORDER — BUPIVACAINE HCL (PF) 0.25 % IJ SOLN
5.0000 mL | Freq: Once | INTRAMUSCULAR | Status: DC
Start: 2015-08-31 — End: 2015-09-01
  Filled 2015-08-31: qty 10

## 2015-08-31 MED ORDER — NAPROXEN 500 MG PO TABS: 500.0000 mg | ORAL_TABLET | Freq: Two times a day (BID) | ORAL | 0 refills | Status: DC | PRN

## 2015-08-31 NOTE — ED Triage Notes (Addendum)
Pt reports left side neck pain radiating into the left shoulder and back of the head. Pt reports hx of same but thinks he may have a pinched nerve.

## 2015-08-31 NOTE — ED Provider Notes (Signed)
[facility]-EM[facility]ENCY [facility]PT Provider Note   CSN: [id number] Arrival date & time: 08/31/15  1627  First Provider Contact:  First MD Initiated Contact with Patient 08/31/15 2115    By signing my name below, I, Freida Busman, attest that this documentation has been prepared under the direction and in the presence of Raeford Razor, MD . Electronically Signed: Freida Busman, Scribe. 08/31/2015. 9:24 PM.  History   Chief Complaint Chief Complaint  Patient presents with  . Neck Pain    The history is provided by the patient. No language interpreter was used.   HPI Comments:  Christian Bowen is a 55 y.o. male who presents to the Emergency Department complaining of gradually worsening, constant, throbbing, left sided neck pain x ~2 weeks. Pt states his pain radiates up to his left face. His pain is exacerbated by movement of his head. He reports associated burning pain in his left shoulder. He denies recent injury/heavy lifting. He has taken Ibuprofen, tylenol, and aleve without relief. He denies sore throat, SOB, CP, and fever.   Past Medical History:  Diagnosis Date  . Apnea, sleep    was tested 2005  . Asthma   . Diabetes mellitus   . Hyperlipemia 10/12/2013  . Hypertension   . Lumbar post-laminectomy syndrome 04/02/2013  . Obesity   . Pain in joint, shoulder region 10/12/2013  . Sleep apnea    CPAP-has had UPPP-tonsils and turb red  . Tobacco abuse 03/13/2013    Patient Active Problem List   Diagnosis Date Noted  . Essential hypertension, benign 10/12/2013  . Hyperlipemia 10/12/2013  . DM (diabetes mellitus), secondary, uncontrolled, with peripheral vascular complications (HCC) 10/12/2013  . Pain in joint, shoulder region 10/12/2013  . Lumbar post-laminectomy syndrome 04/02/2013  . Obstructive sleep apnea 03/13/2013  . Morbid obesity (HCC) 03/13/2013  . Asthma, moderate persistent 03/13/2013  . Tobacco abuse 03/13/2013    Past Surgical History:  Procedure Laterality Date  . BACK  SURGERY  2009  . DORSAL COMPARTMENT RELEASE Left 08/13/2012   Procedure: RELEASE DORSAL COMPARTMENT (DEQUERVAIN) and exploration of radial nerve branch;  Surgeon: Tami Ribas, MD;  Location: [facility];  Service: Orthopedics;  Laterality: Left;  . GANGLION CYST EXCISION  2/14-SCG   lt wrist   . GANGLION CYST EXCISION Left 08/13/2012   Procedure: REMOVAL GANGLION OF WRIST;  Surgeon: Tami Ribas, MD;  Location: [facility];  Service: Orthopedics;  Laterality: Left;  . heel spurs     bilateral feel  . NASAL TURBINATE REDUCTION  8/13   septoplasty-UPPP-tonsils  . SHOULDER ARTHROSCOPY Right 07/02/2013   Procedure: RIGHT SHOULDER ARTHROSCOPY WITH EXTENSIVE DEBRIDEMENT OF ROTATOR CUFF AND LABRUM, SUBACROMIAL DECOMPRESSION, PARTIAL ACROMIOPLASTY WITH CORACOACROMIAL RELEASE;  Surgeon: Thera Flake., MD;  Location: [facility];  Service: Orthopedics;  Laterality: Right;  . SHOULDER ARTHROSCOPY WITH BICEPSTENOTOMY Right 04/15/2014   Procedure: SHOULDER ARTHROSCOPY WITH BICEPSTENOTOMY;  Surgeon: Frederico Hamman, MD;  Location: [facility];  Service: Orthopedics;  Laterality: Right;  . SPINAL CORD STIMULATOR IMPLANT  1997  . SPINAL CORD STIMULATOR INSERTION N/A 04/02/2013   Procedure: LUMBAR SPINAL CORD STIMULATOR Insertion;  Surgeon: Gwynne Edinger, MD;  Location: [facility];  Service: Neurosurgery;  Laterality: N/A;  . TONSILLECTOMY       Home Medications    Prior to Admission medications   Medication Sig Start Date End Date Taking? Authorizing Provider  acetaminophen (TYLENOL) 500 MG tablet Take 1,000 mg by mouth every  6 (six) hours as needed for moderate pain.    Historical Provider, MD  albuterol (PROVENTIL HFA;VENTOLIN HFA) 108 (90 BASE) MCG/ACT inhaler Inhale 2 puffs into the lungs every 6 (six) hours as needed for wheezing or shortness of breath. 11/09/14   Waymon Budge, MD  albuterol (PROVENTIL) (2.5 MG/3ML) 0.083% nebulizer  solution Take 3 mLs (2.5 mg total) by nebulization every 6 (six) hours as needed for wheezing or shortness of breath. 11/09/14   Waymon Budge, MD  atorvastatin (LIPITOR) 40 MG tablet Take 40 mg by mouth daily.    Historical Provider, MD  cetirizine (ZYRTEC) 10 MG tablet Take 1 tablet (10 mg total) by mouth daily. 09/25/14   Charm Rings, MD  diclofenac (VOLTAREN) 75 MG EC tablet Take 1 tablet (75 mg total) by mouth 2 (two) times daily. Patient not taking: Reported on 01/12/2015 08/17/14   Elson Areas, PA-C  fluticasone [facility]) 50 MCG/ACT nasal spray Place 2 sprays into both nostrils daily. Patient not taking: Reported on 01/12/2015 09/25/14   Charm Rings, MD  Fluticasone Furoate-Vilanterol 100-25 MCG/INH AEPB 1 puff then rinse mouth, one time daily Patient not taking: Reported on 01/12/2015 11/09/14   Waymon Budge, MD  glimepiride (AMARYL) 4 MG tablet Take 4 mg by mouth daily with breakfast.    Historical Provider, MD  HYDROcodone-acetaminophen (NORCO/VICODIN) 5-325 MG per tablet Take 2 tablets by mouth every 4 (four) hours as needed. 08/17/14   Elson Areas, PA-C  ibuprofen (ADVIL,MOTRIN) 200 MG tablet Take 400 mg by mouth every 6 (six) hours as needed for moderate pain.    Historical Provider, MD  lisinopril-hydrochlorothiazide (PRINZIDE,ZESTORETIC) 20-25 MG per tablet Take 1 tablet by mouth daily.    Historical Provider, MD  methocarbamol (ROBAXIN-750) 750 MG tablet Take 1 tablet (750 mg total) by mouth 4 (four) times daily. Patient not taking: Reported on 01/12/2015 04/15/14   Margart Sickles, PA-C  oxyCODONE-acetaminophen (PERCOCET) 10-325 MG per tablet Take 1 tablet by mouth every 4 (four) hours as needed for pain. Patient not taking: Reported on 01/12/2015 04/15/14   Margart Sickles, PA-C  sitaGLIPtan-metformin (JANUMET) 50-1000 MG per tablet Take 1 tablet by mouth 2 (two) times daily with a meal.    Historical Provider, MD    Family History Family History  Problem Relation Age of Onset   . Hypertension Mother   . Heart attack Father     Social History Social History  Substance Use Topics  . Smoking status: Current Some Day Smoker    Packs/day: 0.25    Years: 30.00    Types: Cigarettes  . Smokeless tobacco: Never Used     Comment: 1 pack will last him 3-4 weeks  . Alcohol use 0.0 oz/week     Comment: 1 pint over the weekend...its not all the time     Allergies   Hydromorphone hcl   Review of Systems Review of Systems  Constitutional: Negative for chills and fever.  Respiratory: Negative for shortness of breath.   Cardiovascular: Negative for chest pain.  Musculoskeletal: Positive for arthralgias, myalgias and neck pain.  Neurological: Negative for weakness and numbness.  All other systems reviewed and are negative.  Physical Exam Updated Vital Signs BP 124/81   Pulse 92   Temp 98.2 F (36.8 C) (Oral)   Resp 18   SpO2 97%   Physical Exam  Constitutional: He is oriented to person, place, and time. He appears well-developed and well-nourished.  HENT:  Head: Normocephalic and  atraumatic.  Eyes: EOM are normal.  Neck: Normal range of motion.  Cardiovascular: Normal rate, regular rhythm, normal heart sounds and intact distal pulses.   Pulmonary/Chest: Effort normal and breath sounds normal. No respiratory distress.  Abdominal: Soft. He exhibits no distension. There is no tenderness.  Musculoskeletal: Normal range of motion.  Extremely tender to the musculature of left lateral neck and left upper trapezius.  No midline spinal tenderness NVI   Neurological: He is alert and oriented to person, place, and time.  Skin: Skin is warm and dry.  Psychiatric: He has a normal mood and affect. Judgment normal.  Nursing note and vitals reviewed.  ED Treatments / Results  DIAGNOSTIC STUDIES:  Oxygen Saturation is 97% on RA, normal by my interpretation.    COORDINATION OF CARE:  9:21 PM Discussed treatment plan with pt at bedside and pt agreed to  plan.  Labs (all labs ordered are listed, but only abnormal results are displayed) Labs Reviewed - No data to display  EKG  EKG Interpretation None       Radiology No results found.  Procedures Procedures   Procedure Note: Trigger Point Injection (1 point) - L Lower Paracervical Muscular Injection  Indication: Headache  Consent: Informed verbal consent obtained. Detailed procedure including risks/benefits including but not limited to:   Soreness.  Bruising.  Stiffness.  More serious problems are rare. But, they may include:  Bleeding under the skin (hematoma).  Skin infection.  Breaking off of the needle under your skin.  Lung puncture.  The trigger point injection may not work for you.  Contraindications: none noted  Positioning: seated upright  Procedure: Area was cleaned with alcohol. B/l paracervical musculature at C 6/7 level was injected using C7 spinous process as landmark.  3cc total of 0.5% bupavicaine without epinephrine injected with 1.5 inch 25 g needle after making a small wheel and using distracting digital pressure.  Needle directed slightly cephalad from parallel to floor. Aspirated prior to injection.   Pt tolerated well with no apparent immediate complication.   Medications Ordered in ED Medications - No data to display   Initial Impression / Assessment and Plan / ED Course  I have reviewed the triage vital signs and the nursing notes.  Pertinent labs & imaging results that were available during my care of the patient were reviewed by me and considered in my medical decision making (see chart for details).  Clinical Course   55 year old male with neck and upper back and shoulder pain. Most consistent with a muscular etiology. Treated symptomatically and a trigger point injection given. Improvement of symptoms. Very low suspicion for emergent process. Continue symptomatic treatment. Return precautions were discussed. Outpatient follow-up  as needed otherwise.   Final Clinical Impressions(s) / ED Diagnoses   Final diagnoses:  Torticollis, acute  Trapezius muscle spasm    New Prescriptions New Prescriptions   No medications on file   I personally preformed the services scribed in my presence. The recorded information has been reviewed is accurate. Raeford Razor, MD.     Raeford Razor, MD 09/11/15 [phone]

## 2015-10-31 ENCOUNTER — Telehealth: Payer: Self-pay | Admitting: Internal Medicine

## 2015-10-31 NOTE — Telephone Encounter (Signed)
LMTCB x1 for pt.  

## 2015-10-31 NOTE — Telephone Encounter (Signed)
This is a different design of CPAP mask, but it uses the same CPAP machine and air hose- just a diferent mask. I don't personally know of anybody who has tried this.

## 2015-10-31 NOTE — Telephone Encounter (Signed)
Patient states that he saw an advertisement on TV for a nasal machine that replaces CPAP machine.  He said that the machine is on [email or web address] - patient wants to know what Dr. Maple HudsonYoung thinks about this product.        Eliminate CPAP Torture No Straps, No Headgear, No Pain! The CPAP PRO & CPAP PRO II allows for a wide range of sleeping positions. With minimal face contact you can move about freely and sleep peacefully all night long. End your CPAP torture. Attention Sleep Apnea Sufferers! Don't be tortured another night with a painful CPAP mask. Are You An Open Mouth Breather? Click Here For CPAP PRO II The Ultimate CPAP SOLUTION  Learn About CPAP PRO We use only honest and real user testimonials. We took an oath to do just that! Read These 100% Truthful Patient Comments! HAVE QUESTIONS? GET ANSWERS! Read our Frequently Asked Questions section for quick information. Read FAQ's READY TO ORDER CPAP PRO! End the pain, stop the torture, and start sleeping peacefully for just $159 Order Now  Open Mouth Breather? Get rid of your full face mask! Get a CPAP PRO II today! Learn More  CPAP PRO Eliminates Everything That Makes CPAP Masks Unbearable!    Are You An Open Mouth Breather? Click Here For CPAP PRO II  Joe Goldstein -Inventor CPAP PRO I INVENTED THE CPAP PRO Because I Couldn't Stand CPAP Masks As a fellow CPAP sufferer I spent many nights tortured by my CPAP mask. I tried everything to stop the pain and torture but nothing worked. 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 The result is the wonderful CPAP PRO and no more pain! Read The Incredible Story of CPAP PRO

## 2015-10-31 NOTE — Telephone Encounter (Signed)
Patient returning call -pr °

## 2015-11-01 NOTE — Telephone Encounter (Signed)
LMOMTCB x 1 

## 2015-11-01 NOTE — Telephone Encounter (Signed)
Patient returning call - he can be reached at [phone]

## 2015-11-01 NOTE — Telephone Encounter (Signed)
Called spoke with pt. He is aware of recs. He reports if he decides to try this then he will call us back. Nothing further needed

## 2015-11-09 ENCOUNTER — Ambulatory Visit: Payer: Medicare Other | Admitting: Internal Medicine

## 2015-11-20 ENCOUNTER — Encounter (HOSPITAL_COMMUNITY): Payer: Self-pay | Admitting: Emergency Medicine

## 2015-11-20 ENCOUNTER — Ambulatory Visit (HOSPITAL_COMMUNITY)
Admission: EM | Admit: 2015-11-20 | Discharge: 2015-11-20 | Disposition: A | Discharge status: Home / Self Care | Payer: Medicare Other | Attending: Family Medicine | Admitting: Family Medicine

## 2015-11-20 DIAGNOSIS — M791 Myalgia: Secondary | ICD-10-CM

## 2015-11-20 DIAGNOSIS — M7918 Myalgia, other site: Secondary | ICD-10-CM

## 2015-11-20 MED ORDER — HYDROCODONE-ACETAMINOPHEN 5-325 MG PO TABS: 1.0000 | ORAL_TABLET | Freq: Three times a day (TID) | ORAL | 0 refills | Status: DC | PRN

## 2015-11-20 MED ORDER — CYCLOBENZAPRINE HCL 5 MG PO TABS: 5.0000 mg | ORAL_TABLET | Freq: Three times a day (TID) | ORAL | 0 refills | Status: DC | PRN

## 2015-11-20 NOTE — Discharge Instructions (Signed)
You need to try a TENS unit for the neck pain  I think you should see [facility] Bow[facility] at [facility].

## 2015-11-20 NOTE — ED Triage Notes (Signed)
[facility] a [facility] Pain. There is no recent injury noted.

## 2015-11-20 NOTE — ED Provider Notes (Signed)
[facility]    CSN: [id number] Arrival date & time: 11/20/15  1634     History   Chief Complaint Chief Complaint  Patient presents with  . Neck Pain    HPI Iran Rowe is a 55 y.o. male.   This a 55 year old gentleman whose had extensive surgery on his back and both shoulders. He comes in with chronic neck pain of over a year's duration. He's been to see neurosurgeon who has told him that he has nothing to offer him surgically. Patient now has an appointment with his neurosurgeon in [facility].  Patient has had progressive tightness and pain in his left neck and supraclavicular areas. This associated with a numbness there. He is taken Neurontin, and nonsteroidals, and a steroid injection. None of these helped.  Patient now has an indwelling nerve stimulator which has helped his low back. He's never had a TENS unit for his neck. He's used up his physical therapy benefit.      Past Medical History:  Diagnosis Date  . Apnea, sleep    was tested 2005  . Asthma   . Diabetes mellitus   . Hyperlipemia 10/12/2013  . Hypertension   . Lumbar post-laminectomy syndrome 04/02/2013  . Obesity   . Pain in joint, shoulder region 10/12/2013  . Sleep apnea    CPAP-has had UPPP-tonsils and turb red  . Tobacco abuse 03/13/2013    Patient Active Problem List   Diagnosis Date Noted  . Essential hypertension, benign 10/12/2013  . Hyperlipemia 10/12/2013  . DM (diabetes mellitus), secondary, uncontrolled, with peripheral vascular complications (HCC) 10/12/2013  . Pain in joint, shoulder region 10/12/2013  . Lumbar post-laminectomy syndrome 04/02/2013  . Obstructive sleep apnea 03/13/2013  . Morbid obesity (HCC) 03/13/2013  . Asthma, moderate persistent 03/13/2013  . Tobacco abuse 03/13/2013    Past Surgical History:  Procedure Laterality Date  . BACK SURGERY  2009  . DORSAL COMPARTMENT RELEASE Left 08/13/2012   Procedure: RELEASE DORSAL COMPARTMENT  (DEQUERVAIN) and exploration of radial nerve branch;  Surgeon: Tami Ribas, MD;  Location: [facility];  Service: Orthopedics;  Laterality: Left;  . GANGLION CYST EXCISION  2/14-SCG   lt wrist   . GANGLION CYST EXCISION Left 08/13/2012   Procedure: REMOVAL GANGLION OF WRIST;  Surgeon: Tami Ribas, MD;  Location: [facility];  Service: Orthopedics;  Laterality: Left;  . heel spurs     bilateral feel  . NASAL TURBINATE REDUCTION  8/13   septoplasty-UPPP-tonsils  . SHOULDER ARTHROSCOPY Right 07/02/2013   Procedure: RIGHT SHOULDER ARTHROSCOPY WITH EXTENSIVE DEBRIDEMENT OF ROTATOR CUFF AND LABRUM, SUBACROMIAL DECOMPRESSION, PARTIAL ACROMIOPLASTY WITH CORACOACROMIAL RELEASE;  Surgeon: Thera Flake., MD;  Location: [facility];  Service: Orthopedics;  Laterality: Right;  . SHOULDER ARTHROSCOPY WITH BICEPSTENOTOMY Right 04/15/2014   Procedure: SHOULDER ARTHROSCOPY WITH BICEPSTENOTOMY;  Surgeon: Frederico Hamman, MD;  Location: [facility];  Service: Orthopedics;  Laterality: Right;  . SPINAL CORD STIMULATOR IMPLANT  1997  . SPINAL CORD STIMULATOR INSERTION N/A 04/02/2013   Procedure: LUMBAR SPINAL CORD STIMULATOR Insertion;  Surgeon: Gwynne Edinger, MD;  [facility];  Service: Neurosurgery;  Laterality: N/A;  . TONSILLECTOMY         Home Medications    Prior to Admission medications   Medication Sig Start Date End Date Taking? Authorizing Provider  glimepiride (AMARYL) 4 MG tablet Take 4 mg by mouth daily with breakfast.   Yes Historical  Provider, MD  lisinopril-hydrochlorothiazide (PRINZIDE,ZESTORETIC) 20-25 MG per tablet Take 1 tablet by mouth daily.   Yes Historical Provider, MD  sitaGLIPtan-metformin (JANUMET) 50-1000 MG per tablet Take 1 tablet by mouth 2 (two) times daily with a meal.   Yes Historical Provider, MD  albuterol (PROVENTIL HFA;VENTOLIN HFA) 108 (90 BASE) MCG/ACT inhaler Inhale 2 puffs into the lungs every 6  (six) hours as needed for wheezing or shortness of breath. 11/09/14   Waymon Budge, MD  albuterol (PROVENTIL) (2.5 MG/3ML) 0.083% nebulizer solution Take 3 mLs (2.5 mg total) by nebulization every 6 (six) hours as needed for wheezing or shortness of breath. Patient not taking: Reported on 08/31/2015 11/09/14   Waymon Budge, MD  atorvastatin (LIPITOR) 40 MG tablet Take 40 mg by mouth daily.    Historical Provider, MD  cyclobenzaprine (FLEXERIL) 5 MG tablet Take 1 tablet (5 mg total) by mouth 3 (three) times daily as needed for muscle spasms. 11/20/15   Elvina Sidle, MD  diazepam (VALIUM) 5 MG tablet Take 1 tablet (5 mg total) by mouth every 8 (eight) hours as needed for muscle spasms. 08/31/15   Raeford Razor, MD  Fluticasone Furoate-Vilanterol 100-25 MCG/INH AEPB 1 puff then rinse mouth, one time daily 11/09/14   Waymon Budge, MD  HYDROcodone-acetaminophen (NORCO) 5-325 MG tablet Take 1 tablet by mouth every 8 (eight) hours as needed for severe pain. 11/20/15   Elvina Sidle, MD  naproxen (NAPROSYN) 500 MG tablet Take 1 tablet (500 mg total) by mouth 2 (two) times daily as needed. 08/31/15   Raeford Razor, MD    Family History Family History  Problem Relation Age of Onset  . Hypertension Mother   . Heart attack Father     Social History Social History  Substance Use Topics  . Smoking status: Current Some Day Smoker    Packs/day: 0.25    Years: 30.00    Types: Cigarettes  . Smokeless tobacco: Never Used     Comment: 1 pack will last him 2 weeks   . Alcohol use 0.0 oz/week     Comment: 1 pint over the weekend...its not all the time     Allergies   Hydromorphone hcl   Review of Systems Review of Systems  Constitutional: Negative.   HENT: Negative.   Respiratory: Negative.   Genitourinary: Negative.   Musculoskeletal: Positive for back pain and myalgias. Negative for arthralgias and gait problem.  Neurological: Positive for numbness. Negative for dizziness, seizures,  speech difficulty and weakness.     Physical Exam Triage Vital Signs ED Triage Vitals  Enc Vitals Group     BP 11/20/15 1810 123/81     Pulse Rate 11/20/15 1810 95     Resp 11/20/15 1810 14     Temp 11/20/15 1810 98.3 F (36.8 C)     Temp Source 11/20/15 1810 Oral     SpO2 11/20/15 1810 99 %     Weight --      Height --      Head Circumference --      Peak Flow --      Pain Score 11/20/15 1837 10     Pain Loc --      Pain Edu? --      Excl. in GC? --    No data found.   Updated Vital Signs BP 123/81   Pulse 95   Temp 98.3 F (36.8 C) (Oral)   Resp 14   SpO2 99%  Physical Exam  Constitutional: He is oriented to person, place, and time. He appears well-developed and well-nourished.  HENT:  Head: Normocephalic.  Right Ear: External ear normal.  Left Ear: External ear normal.  Nose: Nose normal.  Mouth/Throat: Oropharynx is clear and moist.  Eyes: Conjunctivae and EOM are normal. Pupils are equal, round, and reactive to light.  Neck: Normal range of motion. Neck supple.  Musculoskeletal: Normal range of motion.  Good strength in upper extremities. Normal range of motion of neck. There is some numbness on the lateral aspect of his left neck. Patient has a subcutaneous surgical implant in his left lower lumbar region.  Neurological: He is alert and oriented to person, place, and time. He has normal reflexes. He displays normal reflexes. No cranial nerve deficit. He exhibits normal muscle tone. Coordination normal.  Skin: Skin is warm and dry.  Nursing note and vitals reviewed.    UC Treatments / Results  Labs (all labs ordered are listed, but only abnormal results are displayed) Labs Reviewed - No data to display  EKG  EKG Interpretation None       Radiology No results found.  Procedures Procedures (including critical care time)  Medications Ordered in UC Medications - No data to display   Initial Impression / Assessment and Plan / UC  Course  I have reviewed the triage vital signs and the nursing notes.  Pertinent labs & imaging results that were available during my care of the patient were reviewed by me and considered in my medical decision making (see chart for details).  Clinical Course     Final Clinical Impressions(s) / UC Diagnoses   Final diagnoses:  Musculoskeletal pain  This patient has a chronic pain syndrome. He really needs a conference a physical therapy program and I think Ellamae SiaJohn O'Halloran would be right man. He also needs to try TENS unit before considering any surgical intervention.  New Prescriptions New Prescriptions   CYCLOBENZAPRINE (FLEXERIL) 5 MG TABLET    Take 1 tablet (5 mg total) by mouth 3 (three) times daily as needed for muscle spasms.   HYDROCODONE-ACETAMINOPHEN (NORCO) 5-325 MG TABLET    Take 1 tablet by mouth every 8 (eight) hours as needed for severe pain.     Elvina SidleKurt Georgeanne Frankland, MD 11/20/15 [id number]

## 2015-11-21 NOTE — ED Notes (Signed)
Per Dr. Milus GlazierLauenstein, [facility] on Rx to [phone]... Placed on scan basket to be scanned.

## 2015-11-22 ENCOUNTER — Ambulatory Visit: Payer: Medicare Other | Attending: Family Medicine | Admitting: Physical Therapy

## 2015-11-22 DIAGNOSIS — M542 Cervicalgia: Secondary | ICD-10-CM | POA: Diagnosis present

## 2015-11-22 DIAGNOSIS — R293 Abnormal posture: Secondary | ICD-10-CM

## 2015-11-22 DIAGNOSIS — M5412 Radiculopathy, cervical region: Secondary | ICD-10-CM
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
## 2015-11-22 NOTE — Patient Instructions (Signed)
  Flexibility: Upper Trapezius Stretch   Gently grasp right side of head while reaching behind back with other hand. Tilt head away until a gentle stretch is felt. Hold __30__ seconds. Repeat __2-3__ times per set. Do __1__ sets per session. Do __2__ sessions per day.  [email or web address]://[email or web address].us/340   Levator Stretch   Grasp seat or sit on hand on side to be stretched. Turn head toward other side and look down. Use hand on head to gently stretch neck in that position. Hold __30__ seconds. Repeat on other side. Repeat _2-3___ times. Do __2__ sessions per day.  [email or web address].[email or web address]   Scapular Retraction (Standing)   With arms at sides, pinch shoulder blades together. Repeat _10___ times per set. Do _1___ sets per session. Do __2__ sessions per day.  http://orth.[email or web address].us/944   Flexibility: Neck Retraction   Pull head straight back, keeping eyes and jaw level. Repeat ___10_ times per set. Do _1___ sets per session. Do ___2_ sessions per day.  http://[email or web address].us/344   Posture - Sitting   Sit upright, head facing forward. Try using a roll to support lower back. Keep shoulders relaxed, and avoid rounded back. Keep hips level with knees. Avoid crossing legs for long periods.   Flexibility: Corner Stretch   Standing in corner with hands just above shoulder level and feet __12__ inches from corner, lean forward until a comfortable stretch is felt across chest. Hold ___30_ seconds. Repeat _2-3___ times per set. Do ____1 sets per session. Do __2__ sessions per day.  http://orth.exer.us/342   Copyright  VHI. All rights reserved.    Nod: Deep Cervical Flexor Retrain - Supine    Nod head, tipping chin down. Tighten muscles in back of throat. Hold _5-10__ seconds. Do _10__ times, _2__ times per day.  http://ss.exer.us/192   Copyright  VHI. All rights reserved.

## 2015-11-27 ENCOUNTER — Ambulatory Visit: Payer: Medicare Other | Admitting: Physical Therapy

## 2015-11-27 DIAGNOSIS — M5412 Radiculopathy, cervical region: Secondary | ICD-10-CM

## 2015-11-27 DIAGNOSIS — R293 Abnormal posture: Secondary | ICD-10-CM

## 2015-11-27 DIAGNOSIS — M542 Cervicalgia: Secondary | ICD-10-CM

## 2015-11-27 NOTE — Therapy (Signed)
[facility] [address] [place], [place], [address] Phone: [phone]   Fax:  [phone]  Physical Therapy Treatment  Patient Details  Name: Christian Bowen MRN: [id number] Date of Birth: [birth year] Referring Provider: Dr Milus Glazier   Encounter Date: 11/27/2015      PT End of Session - 11/27/15 1857    Visit Number 2   Number of Visits 16   Date for PT Re-Evaluation 01/17/16   PT Start Time 0733   PT Stop Time 0805   PT Time Calculation (min) 32 min   Activity Tolerance Patient tolerated treatment well   Behavior During Therapy [facility] for tasks assessed/performed      Past Medical History:  Diagnosis Date  . Apnea, sleep    was tested 2005  . Asthma   . Diabetes mellitus   . Hyperlipemia 10/12/2013  . Hypertension   . Lumbar post-laminectomy syndrome 04/02/2013  . Obesity   . Pain in joint, shoulder region 10/12/2013  . Sleep apnea    CPAP-has had UPPP-tonsils and turb red  . Tobacco abuse 03/13/2013    Past Surgical History:  Procedure Laterality Date  . BACK SURGERY  2009  . DORSAL COMPARTMENT RELEASE Left 08/13/2012   Procedure: RELEASE DORSAL COMPARTMENT (DEQUERVAIN) and exploration of radial nerve branch;  Surgeon: Tami Ribas, MD;  Location: [facility];  Service: Orthopedics;  Laterality: Left;  . GANGLION CYST EXCISION  2/14-SCG   lt wrist   . GANGLION CYST EXCISION Left 08/13/2012   Procedure: REMOVAL GANGLION OF WRIST;  Surgeon: Tami Ribas, MD;  Location: [facility];  Service: Orthopedics;  Laterality: Left;  . heel spurs     bilateral feel  . NASAL TURBINATE REDUCTION  8/13   septoplasty-UPPP-tonsils  . SHOULDER ARTHROSCOPY Right 07/02/2013   Procedure: RIGHT SHOULDER ARTHROSCOPY WITH EXTENSIVE DEBRIDEMENT OF ROTATOR CUFF AND LABRUM, SUBACROMIAL DECOMPRESSION, PARTIAL ACROMIOPLASTY WITH CORACOACROMIAL RELEASE;  Surgeon: Thera Flake., MD;  Location: [facility];  Service: Orthopedics;  Laterality: Right;  . SHOULDER ARTHROSCOPY WITH BICEPSTENOTOMY Right 04/15/2014   Procedure: SHOULDER ARTHROSCOPY WITH BICEPSTENOTOMY;  Surgeon: Frederico Hamman, MD;  Location: [facility];  Service: Orthopedics;  Laterality: Right;  . SPINAL CORD STIMULATOR IMPLANT  1997  . SPINAL CORD STIMULATOR INSERTION N/A 04/02/2013   Procedure: LUMBAR SPINAL CORD STIMULATOR Insertion;  Surgeon: Gwynne Edinger, MD;  Location: [facility];  Service: Neurosurgery;  Laterality: N/A;  . TONSILLECTOMY      There were no vitals filed for this visit.      Subjective Assessment - 11/27/15 1853    Subjective Pain About the same.  I want to get the TENS unit from here.    Currently in Pain? Yes   Pain Score 8    Pain Location Neck   Pain Orientation Right;Left   Pain Descriptors / Indicators Headache;Throbbing;Tightness;Cramping;Constant   Pain Radiating Towards BACK OF HEAD   Aggravating Factors  looking down moving head   Pain Relieving Factors meds                         [facility] Adult PT Treatment/Exercise - 11/27/15 0001      Self-Care   Other Self-Care Comments  HOME TENS issued.     Manual Therapy   Manual therapy comments soft tissue work upper back and neck.  Tissue softened some ,  Less tightness with ROM noted  PT Education - 11/27/15 1857    Education provided Yes   Education Details USE of TENS UNIT.  Precautions, do's, don'ts.   Person(s) Educated Patient   Methods Explanation;Demonstration;Verbal cues   Comprehension Verbalized understanding;Returned demonstration          PT Short Term Goals - 11/27/15 1900      PT SHORT TERM GOAL #1   Title Pt will be I with initial HEP for cervical spine, posture   Time 4   Period Weeks   Status On-going     PT SHORT TERM GOAL #2   Title Pt will be able to sleep in his bed with min to mod pain in cervical spine (lay on side)    Time 4   Period Weeks    Status On-going           PT Long Term Goals - 11/22/15 2129      PT LONG TERM GOAL #1   Title Pt will understand and demo safe lifting techniques and body mechanics in order to preserve spine.    Time 8   Period Weeks   Status New     PT LONG TERM GOAL #2   Title Pt will be able to turn head safely bilaterally to drive, no more than min pain    Time 8   Period Weeks   Status New     PT LONG TERM GOAL #3   Title Pt will be able to be I with more advanced HEP for home and gym.    Time 8   Period Weeks   Status New     PT LONG TERM GOAL #4   Title FOTO score will decreased to less than 50% limited to show functional gains   Time 8   Period Weeks   Status New               Plan - 11/27/15 1858    Clinical Impression Statement TENS issued for home.  Patient was able to learn how to use.  Increased comfort with Neck ROM post session.  Objective findings:  Tissue in Nech softer post session.    PT Next Visit Plan traction, Answer any TENS questions and review HEP    PT Home Exercise Plan cervical stab, neck tension stretches , corner     Consulted and Agree with Plan of Care Patient      Patient will benefit from skilled therapeutic intervention in order to improve the following deficits and impairments:  Difficulty walking, Decreased range of motion, Increased fascial restricitons, Obesity, Impaired UE functional use, Pain, Hypomobility, Decreased mobility, Decreased strength, Postural dysfunction, Impaired sensation, Improper body mechanics, Impaired flexibility  Visit Diagnosis: Radiculopathy, cervical region  Cervicalgia  Abnormal posture     Problem List Patient Active Problem List   Diagnosis Date Noted  . Essential hypertension, benign 10/12/2013  . Hyperlipemia 10/12/2013  . DM (diabetes mellitus), secondary, uncontrolled, with peripheral vascular complications (HCC) 10/12/2013  . Pain in joint, shoulder region 10/12/2013  . Lumbar  post-laminectomy syndrome 04/02/2013  . Obstructive sleep apnea 03/13/2013  . Morbid obesity (HCC) 03/13/2013  . Asthma, moderate persistent 03/13/2013  . Tobacco abuse 03/13/2013    Evely Gainey PTA 11/27/2015, 7:01 PM  [facility] [address] [place], [place], [address] Phone: [phone]   Fax:  [phone]  Name: Orpah GreekLuther Morello MRN: [id number] Date of Birth: [birth year]

## 2015-11-29 ENCOUNTER — Ambulatory Visit: Payer: Medicare Other | Admitting: Physical Therapy

## 2015-11-29 DIAGNOSIS — M5412 Radiculopathy, cervical region: Secondary | ICD-10-CM | POA: Diagnosis not present

## 2015-11-29 DIAGNOSIS — R293 Abnormal posture: Secondary | ICD-10-CM

## 2015-11-29 DIAGNOSIS — M542 Cervicalgia: Secondary | ICD-10-CM

## 2015-11-29 NOTE — Therapy (Signed)
[facility] [address] [place], [place], [address] Phone: [phone]   Fax:  [phone]  Physical Therapy Treatment  Patient Details  Name: Christian GreekLuther Shew MRN: [id number] Date of Birth: February 08, 1960 Referring Provider: Dr Milus GlazierLauenstein   Encounter Date: 11/29/2015      PT End of Session - 11/29/15 1149    Visit Number 3   Number of Visits 16   Date for PT Re-Evaluation 01/17/16   PT Start Time 1150   PT Stop Time 1249   PT Time Calculation (min) 59 min   Activity Tolerance Patient tolerated treatment well   Behavior During Therapy [facility] for tasks assessed/performed      Past Medical History:  Diagnosis Date  . Apnea, sleep    was tested 2005  . Asthma   . Diabetes mellitus   . Hyperlipemia 10/12/2013  . Hypertension   . Lumbar post-laminectomy syndrome 04/02/2013  . Obesity   . Pain in joint, shoulder region 10/12/2013  . Sleep apnea    CPAP-has had UPPP-tonsils and turb red  . Tobacco abuse 03/13/2013    Past Surgical History:  Procedure Laterality Date  . BACK SURGERY  2009  . DORSAL COMPARTMENT RELEASE Left 08/13/2012   Procedure: RELEASE DORSAL COMPARTMENT (DEQUERVAIN) and exploration of radial nerve branch;  Surgeon: Tami RibasKevin R Kuzma, MD;  Location: [facility];  Service: Orthopedics;  Laterality: Left;  . GANGLION CYST EXCISION  2/14-SCG   lt wrist   . GANGLION CYST EXCISION Left 08/13/2012   Procedure: REMOVAL GANGLION OF WRIST;  Surgeon: Tami RibasKevin R Kuzma, MD;  Location: [facility];  Service: Orthopedics;  Laterality: Left;  . heel spurs     bilateral feel  . NASAL TURBINATE REDUCTION  8/13   septoplasty-UPPP-tonsils  . SHOULDER ARTHROSCOPY Right 07/02/2013   Procedure: RIGHT SHOULDER ARTHROSCOPY WITH EXTENSIVE DEBRIDEMENT OF ROTATOR CUFF AND LABRUM, SUBACROMIAL DECOMPRESSION, PARTIAL ACROMIOPLASTY WITH CORACOACROMIAL RELEASE;  Surgeon: Thera FlakeW D Caffrey Jr., MD;  Location: [facility];  Service: Orthopedics;  Laterality: Right;  . SHOULDER ARTHROSCOPY WITH BICEPSTENOTOMY Right 04/15/2014   Procedure: SHOULDER ARTHROSCOPY WITH BICEPSTENOTOMY;  Surgeon: Frederico Hammananiel Caffrey, MD;  Location: [facility];  Service: Orthopedics;  Laterality: Right;  . SPINAL CORD STIMULATOR IMPLANT  1997  . SPINAL CORD STIMULATOR INSERTION N/A 04/02/2013   Procedure: LUMBAR SPINAL CORD STIMULATOR Insertion;  Surgeon: Gwynne EdingerPaul C Harkins, MD;  Location: [facility];  Service: Neurosurgery;  Laterality: N/A;  . TONSILLECTOMY      There were no vitals filed for this visit.      Subjective Assessment - 11/29/15 1154    Subjective This TENS is a life saver.  Neck is sore.  No numbness, tingling. I would to try traction.     Currently in Pain? Yes   Pain Score 4    Pain Location Neck   Pain Orientation Left   Pain Descriptors / Indicators Sore   Pain Type Chronic pain   Pain Onset More than a month ago   Pain Frequency Intermittent                         [facility] Adult PT Treatment/Exercise - 11/29/15 1208      Neck Exercises: Seated   Neck Retraction 5 reps;5 secs   Neck Retraction Limitations hand on chin      Traction   Type of Traction Cervical   Min (lbs) 8   Max (  lbs) 20  did not feel pull at 16 lbs , incr to 20 lbs    Hold Time 60   Rest Time 15   Time 15  increased time due to no pull on new machine     Manual Therapy   Manual therapy comments L upper trap and cervicals (post lateral) and suboccipitals      Neck Exercises: Stretches   Upper Trapezius Stretch 2 reps;30 seconds  manual pressure to L superior shoulder    Levator Stretch 2 reps;30 seconds                PT Education - 11/29/15 1249    Education provided Yes   Education Details traction    Person(s) Educated Patient   Methods Explanation;Demonstration   Comprehension Verbalized understanding          PT Short Term Goals - 11/27/15 1900      PT SHORT TERM GOAL  #1   Title Pt will be I with initial HEP for cervical spine, posture   Time 4   Period Weeks   Status On-going     PT SHORT TERM GOAL #2   Title Pt will be able to sleep in his bed with min to mod pain in cervical spine (lay on side)    Time 4   Period Weeks   Status On-going           PT Long Term Goals - 11/22/15 2129      PT LONG TERM GOAL #1   Title Pt will understand and demo safe lifting techniques and body mechanics in order to preserve spine.    Time 8   Period Weeks   Status New     PT LONG TERM GOAL #2   Title Pt will be able to turn head safely bilaterally to drive, no more than min pain    Time 8   Period Weeks   Status New     PT LONG TERM GOAL #3   Title Pt will be able to be I with more advanced HEP for home and gym.    Time 8   Period Weeks   Status New     PT LONG TERM GOAL #4   Title FOTO score will decreased to less than 50% limited to show functional gains   Time 8   Period Weeks   Status New               Plan - 11/29/15 1253    Clinical Impression Statement Trial of traction, eventually felt a good stretch and reported feeling good post session.  Had less pain with cervical stretching when PT applied pressure to L side of neck/shoulder to anchor the muscles.     PT Next Visit Plan traction effect? Begin stab ex.    PT Home Exercise Plan cervical stab, neck tension stretches , corner     Consulted and Agree with Plan of Care Patient      Patient will benefit from skilled therapeutic intervention in order to improve the following deficits and impairments:  Difficulty walking, Decreased range of motion, Increased fascial restricitons, Obesity, Impaired UE functional use, Pain, Hypomobility, Decreased mobility, Decreased strength, Postural dysfunction, Impaired sensation, Improper body mechanics, Impaired flexibility  Visit Diagnosis: Radiculopathy, cervical region  Cervicalgia  Abnormal posture     Problem List Patient Active  Problem List   Diagnosis Date Noted  . Essential hypertension, benign 10/12/2013  . Hyperlipemia 10/12/2013  . DM (  diabetes mellitus), secondary, uncontrolled, with peripheral vascular complications (HCC) 10/12/2013  . Pain in joint, shoulder region 10/12/2013  . Lumbar post-laminectomy syndrome 04/02/2013  . Obstructive sleep apnea 03/13/2013  . Morbid obesity (HCC) 03/13/2013  . Asthma, moderate persistent 03/13/2013  . Tobacco abuse 03/13/2013    PAA,JENNIFER 11/29/2015, 12:56 PM  [facility] [address] [place], [place], [address] Phone: [phone]   Fax:  [phone]  Name: Kavari Parrillo MRN: [id number] Date of Birth: [birth year]  Karie Mainland, PT 11/29/15 12:57 PM Phone: [phone] Fax: [phone]

## 2015-11-30 ENCOUNTER — Ambulatory Visit: Payer: Medicare Other | Admitting: Physical Therapy

## 2015-11-30 DIAGNOSIS — M5412 Radiculopathy, cervical region: Secondary | ICD-10-CM | POA: Diagnosis not present

## 2015-11-30 DIAGNOSIS — R293 Abnormal posture: Secondary | ICD-10-CM

## 2015-11-30 DIAGNOSIS — M542 Cervicalgia: Secondary | ICD-10-CM

## 2015-11-30 NOTE — Therapy (Signed)
[facility] [address] [place], [place], [address] Phone: [phone]   Fax:  [phone]  Physical Therapy Treatment  Patient Details  Name: Christian Bowen MRN: [id number] Date of Birth: 09-Apr-1960 Referring Provider: Dr Milus Glazier   Encounter Date: 11/30/2015      PT End of Session - 11/30/15 0930    Visit Number 4   Number of Visits 16   Date for PT Re-Evaluation 01/17/16   PT Start Time 0733   PT Stop Time 0812   PT Time Calculation (min) 39 min   Activity Tolerance Patient tolerated treatment well   Behavior During Therapy [facility] for tasks assessed/performed      Past Medical History:  Diagnosis Date  . Apnea, sleep    was tested 2005  . Asthma   . Diabetes mellitus   . Hyperlipemia 10/12/2013  . Hypertension   . Lumbar post-laminectomy syndrome 04/02/2013  . Obesity   . Pain in joint, shoulder region 10/12/2013  . Sleep apnea    CPAP-has had UPPP-tonsils and turb red  . Tobacco abuse 03/13/2013    Past Surgical History:  Procedure Laterality Date  . BACK SURGERY  2009  . DORSAL COMPARTMENT RELEASE Left 08/13/2012   Procedure: RELEASE DORSAL COMPARTMENT (DEQUERVAIN) and exploration of radial nerve branch;  Surgeon: Tami Ribas, MD;  Location: [facility];  Service: Orthopedics;  Laterality: Left;  . GANGLION CYST EXCISION  2/14-SCG   lt wrist   . GANGLION CYST EXCISION Left 08/13/2012   Procedure: REMOVAL GANGLION OF WRIST;  Surgeon: Tami Ribas, MD;  Location: [facility];  Service: Orthopedics;  Laterality: Left;  . heel spurs     bilateral feel  . NASAL TURBINATE REDUCTION  8/13   septoplasty-UPPP-tonsils  . SHOULDER ARTHROSCOPY Right 07/02/2013   Procedure: RIGHT SHOULDER ARTHROSCOPY WITH EXTENSIVE DEBRIDEMENT OF ROTATOR CUFF AND LABRUM, SUBACROMIAL DECOMPRESSION, PARTIAL ACROMIOPLASTY WITH CORACOACROMIAL RELEASE;  Surgeon: Thera Flake., MD;  Location: [facility];  Service: Orthopedics;  Laterality: Right;  . SHOULDER ARTHROSCOPY WITH BICEPSTENOTOMY Right 04/15/2014   Procedure: SHOULDER ARTHROSCOPY WITH BICEPSTENOTOMY;  Surgeon: Frederico Hamman, MD;  Location: [facility];  Service: Orthopedics;  Laterality: Right;  . SPINAL CORD STIMULATOR IMPLANT  1997  . SPINAL CORD STIMULATOR INSERTION N/A 04/02/2013   Procedure: LUMBAR SPINAL CORD STIMULATOR Insertion;  Surgeon: Gwynne Edinger, MD;  [facility];  Service: Neurosurgery;  Laterality: N/A;  . TONSILLECTOMY      There were no vitals filed for this visit.      Subjective Assessment - 11/30/15 0923    Subjective NO pain base of skull, Pain is lateral neck where it meets body.  Has edemaLT face just anterior and inferior to ear, Lump.  painful with pressure. Traction did not do much.   Currently in Pain? Yes   Pain Score 4    Pain Location Neck   Pain Orientation Left                         [facility] Adult PT Treatment/Exercise - 11/30/15 0001      Neck Exercises: Supine   Neck Retraction 5 reps   Cervical Rotation 5 reps  with head press   Other Supine Exercise tongue press to back of upper teeth with jaw opening 5 X 5 second holds.     Moist Heat Therapy   Number Minutes Moist Heat 10  Minutes   Moist Heat Location Cervical     Manual Therapy   Manual Therapy --  passive stretch to upper traps and PROM NECK ROTATIONS.   Manual therapy comments L upper trap and cervicals (post lateral) and suboccipitals .  Lymph work lateral neck and face to decrease edema.                  PT Education - 11/29/15 1249    Education provided Yes   Education Details traction    Person(s) Educated Patient   Methods Explanation;Demonstration   Comprehension Verbalized understanding          PT Short Term Goals - 11/27/15 1900      PT SHORT TERM GOAL #1   Title Pt will be I with initial HEP for cervical spine, posture   Time 4   Period Weeks    Status On-going     PT SHORT TERM GOAL #2   Title Pt will be able to sleep in his bed with min to mod pain in cervical spine (lay on side)    Time 4   Period Weeks   Status On-going           PT Long Term Goals - 11/22/15 2129      PT LONG TERM GOAL #1   Title Pt will understand and demo safe lifting techniques and body mechanics in order to preserve spine.    Time 8   Period Weeks   Status New     PT LONG TERM GOAL #2   Title Pt will be able to turn head safely bilaterally to drive, no more than min pain    Time 8   Period Weeks   Status New     PT LONG TERM GOAL #3   Title Pt will be able to be I with more advanced HEP for home and gym.    Time 8   Period Weeks   Status New     PT LONG TERM GOAL #4   Title FOTO score will decreased to less than 50% limited to show functional gains   Time 8   Period Weeks   Status New               Plan - 11/30/15 0930    Clinical Impression Statement Patient pain improved at base of shull.  It is now centered at lateral neck where neck meets body.  Tissue swelling left face unusual and decreased with manual. Patient will see a specialist in [place] next month.    PT Next Visit Plan Stabilization exercises ( patient thought he had enough exercises already)  Assess treatment   PT Home Exercise Plan cervical stab, neck tension stretches , corner     Consulted and Agree with Plan of Care Patient      Patient will benefit from skilled therapeutic intervention in order to improve the following deficits and impairments:  Difficulty walking, Decreased range of motion, Increased fascial restricitons, Obesity, Impaired UE functional use, Pain, Hypomobility, Decreased mobility, Decreased strength, Postural dysfunction, Impaired sensation, Improper body mechanics, Impaired flexibility  Visit Diagnosis: Radiculopathy, cervical region  Cervicalgia  Abnormal posture     Problem List Patient Active Problem List    Diagnosis Date Noted  . Essential hypertension, benign 10/12/2013  . Hyperlipemia 10/12/2013  . DM (diabetes mellitus), secondary, uncontrolled, with peripheral vascular complications (HCC) 10/12/2013  . Pain in joint, shoulder region 10/12/2013  . Lumbar post-laminectomy syndrome 04/02/2013  .  Obstructive sleep apnea 03/13/2013  . Morbid obesity (HCC) 03/13/2013  . Asthma, moderate persistent 03/13/2013  . Tobacco abuse 03/13/2013    Kymani Laursen PTA 11/30/2015, 4:23 PM  [facility] [address] [place], [place], [address] Phone: [phone]   Fax:  [phone]  Name: Kailan Laws MRN: [id number] Date of Birth: 05-May-1960

## 2015-12-04 ENCOUNTER — Ambulatory Visit: Payer: Medicare Other | Admitting: Physical Therapy

## 2015-12-06 ENCOUNTER — Ambulatory Visit: Payer: Medicare Other | Attending: Family Medicine | Admitting: Physical Therapy

## 2015-12-06 DIAGNOSIS — M542 Cervicalgia: Secondary | ICD-10-CM | POA: Diagnosis not present

## 2015-12-06 DIAGNOSIS — M5412 Radiculopathy, cervical region: Secondary | ICD-10-CM | POA: Diagnosis present

## 2015-12-06 DIAGNOSIS — R293 Abnormal posture: Secondary | ICD-10-CM | POA: Insufficient documentation

## 2015-12-06 NOTE — Therapy (Signed)
[place], [place], [address] Phone: [phone]   Fax:  [phone]  Physical Therapy Treatment  Patient Details  Name: Christian Bowen MRN: [id number] Date of Birth: 1960/07/18 Referring Provider: Dr Joseph Art   Encounter Date: 12/06/2015      PT End of Session - 12/06/15 1009    Visit Number 5   Number of Visits 16   Date for PT Re-Evaluation 01/17/16   PT Start Time 0935   PT Stop Time 1019   PT Time Calculation (min) 44 min   Activity Tolerance Patient tolerated treatment well   Behavior During Therapy [facility] for tasks assessed/performed      Past Medical History:  Diagnosis Date  . Apnea, sleep    was tested 2005  . Asthma   . Diabetes mellitus   . Hyperlipemia 10/12/2013  . Hypertension   . Lumbar post-laminectomy syndrome 04/02/2013  . Obesity   . Pain in joint, shoulder region 10/12/2013  . Sleep apnea    CPAP-has had UPPP-tonsils and turb red  . Tobacco abuse 03/13/2013    Past Surgical History:  Procedure Laterality Date  . BACK SURGERY  2009  . DORSAL COMPARTMENT RELEASE Left 08/13/2012   Procedure: RELEASE DORSAL COMPARTMENT (DEQUERVAIN) and exploration of radial nerve branch;  Surgeon: Tennis Must, MD;  Location: [place];  Service: Orthopedics;  Laterality: Left;  . GANGLION CYST EXCISION  2/14-SCG   lt wrist   . GANGLION CYST EXCISION Left 08/13/2012   Procedure: REMOVAL GANGLION OF WRIST;  Surgeon: Tennis Must, MD;  Location: [place];  Service: Orthopedics;  Laterality: Left;  . heel spurs     bilateral feel  . NASAL TURBINATE REDUCTION  8/13   septoplasty-UPPP-tonsils  . SHOULDER ARTHROSCOPY Right 07/02/2013   Procedure: RIGHT SHOULDER ARTHROSCOPY WITH EXTENSIVE DEBRIDEMENT OF ROTATOR CUFF AND LABRUM, SUBACROMIAL DECOMPRESSION, PARTIAL ACROMIOPLASTY WITH CORACOACROMIAL RELEASE;  Surgeon: Yvette Rack., MD;  Location: [place];  Service: Orthopedics;  Laterality: Right;  . SHOULDER ARTHROSCOPY WITH BICEPSTENOTOMY Right 04/15/2014   Procedure: SHOULDER ARTHROSCOPY WITH BICEPSTENOTOMY;  Surgeon: Earlie Server, MD;  Location: [place];  Service: Orthopedics;  Laterality: Right;  . SPINAL CORD STIMULATOR IMPLANT  1997  . SPINAL CORD STIMULATOR INSERTION N/A 04/02/2013   Procedure: LUMBAR SPINAL CORD STIMULATOR Insertion;  Surgeon: Bonna Gains, MD;  Location: [place] [facility];  Service: Neurosurgery;  Laterality: N/A;  . TONSILLECTOMY      There were no vitals filed for this visit.      Subjective Assessment - 12/06/15 0934    Subjective Its a little better, not really shooting down my arm.  The traction hurt my head.     Currently in Pain? Yes   Pain Score 4    Pain Location Neck   Pain Orientation Left;Lateral   Pain Descriptors / Indicators Tightness   Pain Type Chronic pain   Pain Radiating Towards head    Pain Onset More than a month ago   Pain Frequency Intermittent   Aggravating Factors  moving head, looking side to side    Pain Relieving Factors meds, stretching             [facility] PT Assessment - 12/06/15 0947      AROM   Cervical Flexion 60   Cervical Extension 50   Cervical - Right Side Bend 52   Cervical - Left Side Bend 50  Cervical - Right Rotation 80   Cervical - Left Rotation 74           [facility] Adult PT Treatment/Exercise - 12/06/15 0943      Neck Exercises: Supine   Neck Retraction 10 reps   Upper Extremity D1 Flexion;10 reps;Theraband   Theraband Level (UE D1) Level 3 (Green)   Other Supine Exercise small ROM, nods and rotation in ball    Other Supine Exercise horiz pull with chin tuck green x 10      Moist Heat Therapy   Number Minutes Moist Heat 8 Minutes   Moist Heat Location Cervical     Manual Therapy   Manual Therapy Soft tissue mobilization;Myofascial release;Passive ROM;Manual Traction   Manual therapy comments L upper trap, lateral  cervicals   Passive ROM Rt. rotation and lateral fleixon    Manual Traction 3 x 15 sec                PT Education - 12/06/15 1009    Education provided Yes   Education Details cervical stabilization , ROM improvements   Person(s) Educated Patient   Methods Explanation   Comprehension Verbalized understanding          PT Short Term Goals - 12/06/15 1017      PT SHORT TERM GOAL #1   Title Pt will be I with initial HEP for cervical spine, posture   Baseline met for ROM    Status Partially Met     PT SHORT TERM GOAL #2   Title Pt will be able to sleep in his bed with min to mod pain in cervical spine (lay on side)    Status Partially Met           PT Long Term Goals - 12/06/15 1017      PT LONG TERM GOAL #1   Title Pt will understand and demo safe lifting techniques and body mechanics in order to preserve spine.    Status On-going     PT LONG TERM GOAL #2   Title Pt will be able to turn head safely bilaterally to drive, no more than min pain    Baseline met but not consistently, takes muscle relaxers   Status Partially Met     PT LONG TERM GOAL #3   Title Pt will be able to be I with more advanced HEP for home and gym.    Status On-going     PT LONG TERM GOAL #4   Title FOTO score will decreased to less than 50% limited to show functional gains   Status On-going               Plan - 12/06/15 1009    Clinical Impression Statement Pt doing well, stretches have helped a great deal.  Less radiation of pain.  AROM improved to normal.  Would like to cont until he sees the MD.    PT Next Visit Plan Stabilization exercises- give handouts  ( patient thought he had enough exercises already)  Assess treatment   PT Home Exercise Plan cervical stab, neck tension stretches , corner     Consulted and Agree with Plan of Care Patient      Patient will benefit from skilled therapeutic intervention in order to improve the following deficits and impairments:   Difficulty walking, Decreased range of motion, Increased fascial restricitons, Obesity, Impaired UE functional use, Pain, Hypomobility, Decreased mobility, Decreased strength, Postural dysfunction, Impaired sensation, Improper body mechanics, Impaired flexibility  Visit Diagnosis: Cervicalgia  Abnormal posture  Radiculopathy, cervical region     Problem List Patient Active Problem List   Diagnosis Date Noted  . Essential hypertension, benign 10/12/2013  . Hyperlipemia 10/12/2013  . DM (diabetes mellitus), secondary, uncontrolled, with peripheral vascular complications ([place]) 30/11/4043  . Pain in joint, shoulder region 10/12/2013  . Lumbar post-laminectomy syndrome 04/02/2013  . Obstructive sleep apnea 03/13/2013  . Morbid obesity ([place]) 03/13/2013  . Asthma, moderate persistent 03/13/2013  . Tobacco abuse 03/13/2013    Josuel Koeppen 12/06/2015, 10:19 AM  [facility] [address] [place], [place], [address] Phone: [phone]   Fax:  [phone]  Name: Crawford Tamura MRN: [id number] Date of Birth: 03/28/1960  Raeford Razor, PT 12/06/15 10:20 AM Phone: [phone] Fax: [phone]

## 2015-12-07 ENCOUNTER — Ambulatory Visit: Payer: Medicare Other | Admitting: Physical Therapy

## 2015-12-11 ENCOUNTER — Ambulatory Visit: Payer: Medicare Other | Admitting: Physical Therapy

## 2015-12-11 DIAGNOSIS — M542 Cervicalgia: Secondary | ICD-10-CM

## 2015-12-11 DIAGNOSIS — R293 Abnormal posture: Secondary | ICD-10-CM

## 2015-12-11 DIAGNOSIS — M5412 Radiculopathy, cervical region: Secondary | ICD-10-CM

## 2015-12-11 NOTE — Therapy (Addendum)
[place], [place], [address] Phone: [phone]   Fax:  [phone]  Physical Therapy Treatment and Discharge ADDEND   Patient Details  Name: Christian Bowen MRN: [id number] Date of Birth: 07/28/1960 Referring Provider: Dr Joseph Art   Encounter Date: 12/11/2015      PT End of Session - 12/11/15 1042    Visit Number 6   Number of Visits 16   Date for PT Re-Evaluation 01/17/16   PT Start Time 0948   PT Stop Time 1015   PT Time Calculation (min) 27 min   Activity Tolerance Patient tolerated treatment well   Behavior During Therapy [facility] for tasks assessed/performed      Past Medical History:  Diagnosis Date  . Apnea, sleep    was tested 2005  . Asthma   . Diabetes mellitus   . Hyperlipemia 10/12/2013  . Hypertension   . Lumbar post-laminectomy syndrome 04/02/2013  . Obesity   . Pain in joint, shoulder region 10/12/2013  . Sleep apnea    CPAP-has had UPPP-tonsils and turb red  . Tobacco abuse 03/13/2013    Past Surgical History:  Procedure Laterality Date  . BACK SURGERY  2009  . DORSAL COMPARTMENT RELEASE Left 08/13/2012   Procedure: RELEASE DORSAL COMPARTMENT (DEQUERVAIN) and exploration of radial nerve branch;  Surgeon: Tennis Must, MD;  Location: [place];  Service: Orthopedics;  Laterality: Left;  . GANGLION CYST EXCISION  2/14-SCG   lt wrist   . GANGLION CYST EXCISION Left 08/13/2012   Procedure: REMOVAL GANGLION OF WRIST;  Surgeon: Tennis Must, MD;  Location: [place];  Service: Orthopedics;  Laterality: Left;  . heel spurs     bilateral feel  . NASAL TURBINATE REDUCTION  8/13   septoplasty-UPPP-tonsils  . SHOULDER ARTHROSCOPY Right 07/02/2013   Procedure: RIGHT SHOULDER ARTHROSCOPY WITH EXTENSIVE DEBRIDEMENT OF ROTATOR CUFF AND LABRUM, SUBACROMIAL DECOMPRESSION, PARTIAL ACROMIOPLASTY WITH CORACOACROMIAL RELEASE;  Surgeon: Yvette Rack., MD;  Location:  [place];  Service: Orthopedics;  Laterality: Right;  . SHOULDER ARTHROSCOPY WITH BICEPSTENOTOMY Right 04/15/2014   Procedure: SHOULDER ARTHROSCOPY WITH BICEPSTENOTOMY;  Surgeon: Earlie Server, MD;  Location: [place];  Service: Orthopedics;  Laterality: Right;  . SPINAL CORD STIMULATOR IMPLANT  1997  . SPINAL CORD STIMULATOR INSERTION N/A 04/02/2013   Procedure: LUMBAR SPINAL CORD STIMULATOR Insertion;  Surgeon: Bonna Gains, MD;  Location: [place] [facility];  Service: Neurosurgery;  Laterality: N/A;  . TONSILLECTOMY      There were no vitals filed for this visit.      Subjective Assessment - 12/11/15 0950    Subjective Pain is a lot better. It has never been in my arm.  I have been working out some and stretching 2 x a day.   Currently in Pain? Yes   Pain Score 4    Pain Location Neck   Pain Orientation Left;Lateral   Pain Descriptors / Indicators Tightness   Pain Type Chronic pain   Pain Radiating Towards head   Pain Frequency Intermittent   Aggravating Factors  Laying on side for sleeping   Pain Relieving Factors muscle relaxers,  TENS unite 2 x a day,  neck stretches 2 X a day                         [facility] Adult PT Treatment/Exercise - 12/11/15 0001      Self-Care  Other Self-Care Comments  Showed Butterfly pilloe and neck support with towel roll for sleeping.  Patient was able to relax with these     Neck Exercises: Supine   Neck Retraction 10 reps   Shoulder Flexion 10 reps   Shoulder Flexion Limitations green band, narrowed grip   Upper Extremity D1 Flexion;10 reps;Theraband   Theraband Level (UE D1) Level 3 (Green)  both, cues initially,  green band, narrow grip   Other Supine Exercise ER green band 10 x  Sash and horizontal pulls,  cues initially 10 x each     Shoulder Exercises: ROM/Strengthening   UBE (Upper Arm Bike) 4 minutes reverse L1   Other ROM/Strengthening Exercises pull down to chest 55 LBS 10 x 2 sets,   ROW 55 LBS 10 x 2 sets                  PT Short Term Goals - 12/11/15 1451      PT SHORT TERM GOAL #1   Title Pt will be I with initial HEP for cervical spine, posture   Baseline independent   Time 4   Period Weeks   Status Achieved     PT SHORT TERM GOAL #2   Title Pt will be able to sleep in his bed with min to mod pain in cervical spine (lay on side)    Baseline unable   education , practiced towel roll and Butterfly pillow   Time 4   Period Weeks   Status Partially Met           PT Long Term Goals - 12/11/15 1452      PT LONG TERM GOAL #1   Title Pt will understand and demo safe lifting techniques and body mechanics in order to preserve spine.    Time 8   Period Weeks   Status On-going     PT LONG TERM GOAL #2   Title Pt will be able to turn head safely bilaterally to drive, no more than min pain    Baseline met but not consistently, takes muscle relaxers   Time 8   Period Weeks   Status Partially Met     PT LONG TERM GOAL #3   Title Pt will be able to be I with more advanced HEP for home and gym.    Baseline Started machines today   Time 8   Period Weeks   Status On-going     PT LONG TERM GOAL #4   Title FOTO score will decreased to less than 50% limited to show functional gains   Time 8   Period Weeks   Status Unable to assess               Plan - 12/11/15 1449    Clinical Impression Statement Short session due to patient was late.  He was able to progress him to green band today. Pain improving to 4/10 post exercise.  STG#1 met.Patient said he has not ha pain in the last 3 days, however if he does not take his muscle relaxer pin returns.    PT Next Visit Plan Stabilization exercises- Supine scapular stabilization. Assess treatment,  Try Cervical stab II exercises   PT Home Exercise Plan cervical stab, neck tension stretches , corner     Consulted and Agree with Plan of Care Patient      Patient will benefit from skilled  therapeutic intervention in order to improve the following deficits and impairments:  Difficulty  walking, Decreased range of motion, Increased fascial restricitons, Obesity, Impaired UE functional use, Pain, Hypomobility, Decreased mobility, Decreased strength, Postural dysfunction, Impaired sensation, Improper body mechanics, Impaired flexibility  Visit Diagnosis: Cervicalgia  Abnormal posture  Radiculopathy, cervical region     Problem List Patient Active Problem List   Diagnosis Date Noted  . Essential hypertension, benign 10/12/2013  . Hyperlipemia 10/12/2013  . DM (diabetes mellitus), secondary, uncontrolled, with peripheral vascular complications ([place]) 56/37/2942  . Pain in joint, shoulder region 10/12/2013  . Lumbar post-laminectomy syndrome 04/02/2013  . Obstructive sleep apnea 03/13/2013  . Morbid obesity ([place]) 03/13/2013  . Asthma, moderate persistent 03/13/2013  . Tobacco abuse 03/13/2013    HARRIS,KAREN PTA 12/11/2015, 2:55 PM  [facility] [address] [place], [place], [address] Phone: [phone]   Fax:  [phone]  Name: Christian Bowen MRN: [id number] Date of Birth: 1960-03-09  PHYSICAL THERAPY DISCHARGE SUMMARY  Visits from Start of Care: 6  Current functional level related to goals / functional outcomes: Unknown, patient inactive     Remaining deficits:See above    Education / Equipment: Posture, HEP, anatomy Plan: Patient agrees to discharge.  Patient goals were partially met. Patient is being discharged due to not returning since the last visit.  ?????     His mother was sick, has not called to reschedule.  Raeford Razor, PT 04/18/16 3:03 PM Phone: [phone] Fax: [phone]

## 2015-12-13 ENCOUNTER — Ambulatory Visit: Payer: Medicare Other | Admitting: Physical Therapy

## 2015-12-18 ENCOUNTER — Ambulatory Visit: Payer: Medicare Other | Admitting: Physical Therapy

## 2016-01-17 ENCOUNTER — Other Ambulatory Visit: Payer: Self-pay | Admitting: Internal Medicine

## 2016-02-24 ENCOUNTER — Other Ambulatory Visit: Payer: Self-pay | Admitting: Internal Medicine

## 2016-03-11 ENCOUNTER — Ambulatory Visit: Payer: Medicare Other | Admitting: Internal Medicine

## 2016-05-02 ENCOUNTER — Other Ambulatory Visit: Payer: Self-pay | Admitting: Internal Medicine

## 2016-05-08 ENCOUNTER — Ambulatory Visit (HOSPITAL_COMMUNITY)
Admission: EM | Admit: 2016-05-08 | Discharge: 2016-05-08 | Disposition: A | Discharge status: Home / Self Care | Payer: Medicare Other | Attending: Internal Medicine | Admitting: Internal Medicine

## 2016-05-08 ENCOUNTER — Ambulatory Visit (INDEPENDENT_AMBULATORY_CARE_PROVIDER_SITE_OTHER): Payer: Medicare Other

## 2016-05-08 ENCOUNTER — Encounter (HOSPITAL_COMMUNITY): Payer: Self-pay | Admitting: Emergency Medicine

## 2016-05-08 DIAGNOSIS — B9789 Other viral agents as the cause of diseases classified elsewhere: Secondary | ICD-10-CM | POA: Diagnosis not present

## 2016-05-08 DIAGNOSIS — R05 Cough: Secondary | ICD-10-CM | POA: Diagnosis not present

## 2016-05-08 DIAGNOSIS — M62830 Muscle spasm of back: Secondary | ICD-10-CM | POA: Diagnosis not present

## 2016-05-08 DIAGNOSIS — M542 Cervicalgia: Secondary | ICD-10-CM

## 2016-05-08 DIAGNOSIS — M549 Dorsalgia, unspecified: Secondary | ICD-10-CM

## 2016-05-08 DIAGNOSIS — J069 Acute upper respiratory infection, unspecified: Secondary | ICD-10-CM

## 2016-05-08 MED ORDER — CYCLOBENZAPRINE HCL 5 MG PO TABS: 5.0000 mg | ORAL_TABLET | Freq: Three times a day (TID) | ORAL | 0 refills | Status: DC | PRN

## 2016-05-08 MED ORDER — MELOXICAM 15 MG PO TABS: 15.0000 mg | ORAL_TABLET | Freq: Every day | ORAL | 0 refills | Status: DC

## 2016-05-08 MED ORDER — BENZONATATE 100 MG PO CAPS: 100.0000 mg | ORAL_CAPSULE | Freq: Three times a day (TID) | ORAL | 0 refills | Status: DC

## 2016-05-08 NOTE — ED Provider Notes (Signed)
CSN: [id number]     Arrival date & time 05/08/16  1523 History   First MD Initiated Contact with Patient 05/08/16 1550     Chief Complaint  Patient presents with  . Cough   (Consider location/radiation/quality/duration/timing/severity/associated sxs/prior Treatment) HPI Christian Bowen is a 56 y.o. male presenting to UC with c/o cough, congestion, hoarseness, aching and swelling spells that started 4-5 days ago.  Cough is worse at night, keeping pt from sleeping.  He has tried OTC mucinex with mild relief. No known sick contacts. Hx of asthma. He has been using his inhaler with mild relief.  Denies needing to have steroid injections in the past for his asthma. He does use Breo.   Pt also c/o gradually worsening neck and upper back pain with intermittent back muscle spasms. Pt reports hx of back surgeries and known arthritis. He has not f/u with his neurosurgeon or PCP in several months as he notes he does not like leaving his house much.  He has been on valium in the past for his muscle spasms as well as flexeril.  Denies numbness or tingling in arms or legs.    Past Medical History:  Diagnosis Date  . Apnea, sleep    was tested 2005  . Asthma   . Diabetes mellitus   . Hyperlipemia 10/12/2013  . Hypertension   . Lumbar post-laminectomy syndrome 04/02/2013  . Obesity   . Pain in joint, shoulder region 10/12/2013  . Sleep apnea    CPAP-has had UPPP-tonsils and turb red  . Tobacco abuse 03/13/2013   Past Surgical History:  Procedure Laterality Date  . BACK SURGERY  2009  . DORSAL COMPARTMENT RELEASE Left 08/13/2012   Procedure: RELEASE DORSAL COMPARTMENT (DEQUERVAIN) and exploration of radial nerve branch;  Surgeon: Tami Ribas, MD;  Location: [facility];  Service: Orthopedics;  Laterality: Left;  . GANGLION CYST EXCISION  2/14-SCG   lt wrist   . GANGLION CYST EXCISION Left 08/13/2012   Procedure: REMOVAL GANGLION OF WRIST;  Surgeon: Tami Ribas, MD;  Location: [facility];  Service: Orthopedics;  Laterality: Left;  . heel spurs     bilateral feel  . NASAL TURBINATE REDUCTION  8/13   septoplasty-UPPP-tonsils  . SHOULDER ARTHROSCOPY Right 07/02/2013   Procedure: RIGHT SHOULDER ARTHROSCOPY WITH EXTENSIVE DEBRIDEMENT OF ROTATOR CUFF AND LABRUM, SUBACROMIAL DECOMPRESSION, PARTIAL ACROMIOPLASTY WITH CORACOACROMIAL RELEASE;  Surgeon: Thera Flake., MD;  Location: [facility];  Service: Orthopedics;  Laterality: Right;  . SHOULDER ARTHROSCOPY WITH BICEPSTENOTOMY Right 04/15/2014   Procedure: SHOULDER ARTHROSCOPY WITH BICEPSTENOTOMY;  Surgeon: Frederico Hamman, MD;  Location: [facility];  Service: Orthopedics;  Laterality: Right;  . SPINAL CORD STIMULATOR IMPLANT  1997  . SPINAL CORD STIMULATOR INSERTION N/A 04/02/2013   Procedure: LUMBAR SPINAL CORD STIMULATOR Insertion;  Surgeon: Gwynne Edinger, MD;  Location: [facility];  Service: Neurosurgery;  Laterality: N/A;  . TONSILLECTOMY     Family History  Problem Relation Age of Onset  . Hypertension Mother   . Heart attack Father    Social History  Substance Use Topics  . Smoking status: Current Some Day Smoker    Packs/day: 0.25    Years: 30.00    Types: Cigarettes  . Smokeless tobacco: Never Used     Comment: 1 pack will last him 2 weeks   . Alcohol use 0.0 oz/week     Comment: 1 pint over the weekend...its not all the time  Review of Systems  Constitutional: Negative for chills and fever.  HENT: Positive for congestion, rhinorrhea, sore throat and voice change. Negative for ear pain and trouble swallowing.   Respiratory: Positive for cough and shortness of breath.   Cardiovascular: Negative for chest pain and palpitations.  Gastrointestinal: Negative for abdominal pain, diarrhea, nausea and vomiting.  Musculoskeletal: Positive for arthralgias, back pain, myalgias and neck pain.  Skin: Negative for rash.    Allergies  Hydromorphone hcl  Home Medications    Prior to Admission medications   Medication Sig Start Date End Date Taking? Authorizing Provider  albuterol (PROVENTIL) (2.5 MG/3ML) 0.083% nebulizer solution Take 3 mLs (2.5 mg total) by nebulization every 6 (six) hours as needed for wheezing or shortness of breath. Patient not taking: Reported on 08/31/2015 11/09/14   Waymon Budge, MD  atorvastatin (LIPITOR) 40 MG tablet Take 40 mg by mouth daily.    Historical Provider, MD  benzonatate (TESSALON) 100 MG capsule Take 1 capsule (100 mg total) by mouth every 8 (eight) hours. 05/08/16   Junius Finner, PA-C  BREO ELLIPTA 100-25 MCG/INH AEPB take 1 inhalation by mouth once daily AND THEN RINSE MOUTH 05/02/16   Waymon Budge, MD  cyclobenzaprine (FLEXERIL) 5 MG tablet Take 1-2 tablets (5-10 mg total) by mouth 3 (three) times daily as needed for muscle spasms. 05/08/16   Junius Finner, PA-C  diazepam (VALIUM) 5 MG tablet Take 1 tablet (5 mg total) by mouth every 8 (eight) hours as needed for muscle spasms. 08/31/15   Raeford Razor, MD  glimepiride (AMARYL) 4 MG tablet Take 4 mg by mouth daily with breakfast.    Historical Provider, MD  HYDROcodone-acetaminophen (NORCO) 5-325 MG tablet Take 1 tablet by mouth every 8 (eight) hours as needed for severe pain. 11/20/15   Elvina Sidle, MD  lisinopril-hydrochlorothiazide (PRINZIDE,ZESTORETIC) 20-25 MG per tablet Take 1 tablet by mouth daily.    Historical Provider, MD  meloxicam (MOBIC) 15 MG tablet Take 1 tablet (15 mg total) by mouth daily. 05/08/16   Junius Finner, PA-C  naproxen (NAPROSYN) 500 MG tablet Take 1 tablet (500 mg total) by mouth 2 (two) times daily as needed. 08/31/15   Raeford Razor, MD  PROAIR HFA 108 [id number] Base) MCG/ACT inhaler inhale 2 puffs by mouth every 6 hours 02/26/16   Waymon Budge, MD  sitaGLIPtan-metformin (JANUMET) 50-1000 MG per tablet Take 1 tablet by mouth 2 (two) times daily with a meal.    Historical Provider, MD   Meds Ordered and Administered this Visit  Medications - No  data to display  BP 127/80 (BP Location: Left Arm) Comment (BP Location): large cuff  Pulse (!) 107   Temp 98.1 F (36.7 C) (Oral)   Resp (!) 22   SpO2 96%  No data found.   Physical Exam  Constitutional: He is oriented to person, place, and time. He appears well-developed and well-nourished.  Pt sitting on exam bed, appears mildly fatigued, acutely ill but non-toxic. NAD. Alert and cooperative during exam.  HENT:  Head: Normocephalic and atraumatic.  Right Ear: Tympanic membrane normal.  Left Ear: Tympanic membrane normal.  Nose: Mucosal edema present.  Mouth/Throat: Uvula is midline and mucous membranes are normal. Posterior oropharyngeal erythema present. No oropharyngeal exudate, posterior oropharyngeal edema or tonsillar abscesses.  Eyes: EOM are normal.  Neck: Normal range of motion.  Cardiovascular: Regular rhythm.  Tachycardia present.   Mild tachycardia, regular rhythm   Pulmonary/Chest: Effort normal. No respiratory distress. He has wheezes (  faint expiratory). He has no rales.  Musculoskeletal: Normal range of motion.  Neurological: He is alert and oriented to person, place, and time.  Skin: Skin is warm and dry. He is not diaphoretic.  Psychiatric: He has a normal mood and affect. His behavior is normal.  Nursing note and vitals reviewed.   Urgent Care Course     Procedures (including critical care time)  Labs Review Labs Reviewed - No data to display  Imaging Review Dg Chest 2 View  Result Date: 05/08/2016 CLINICAL DATA:  Cough, congestion, fatigue EXAM: CHEST  2 VIEW COMPARISON:  03/26/2013 FINDINGS: Cardiomediastinal silhouette is stable. No infiltrate or pleural effusion. No pulmonary edema. Spinal stimulator wires are noted mid and lower thoracic spine IMPRESSION: No active cardiopulmonary disease. Electronically Signed   By: Natasha Mead M.D.   On: 05/08/2016 16:12     MDM   1. Viral URI with cough   2. Neck pain on left side   3. Upper back pain on  left side   4. Muscle spasm of back    Pt c/o 4-5 days of URI symptoms with cough keeping him up at night.   Pt also c/o exacerbation of chronic neck and back pain with muscle spasms.    Pt has been on valium and vicodin in the past due to muscle pain. Due to chronic nature of pain, will prescribe Flexeril and mobic, encourage f/u with PCP or neurosurgeon for his chronic neck/back pain.  Rx: Tessalon Encouraged fluids and rest, f/u with PCP in 1 week if not improving, sooner if worsening.    Junius Finner, PA-C 05/08/16 [phone]

## 2016-05-08 NOTE — ED Triage Notes (Signed)
Cough, hoarseness, aching, sweating spells.  Symptoms started on Friday.  Cough is keeping patient from sleeping at night

## 2016-05-10 ENCOUNTER — Encounter (HOSPITAL_COMMUNITY): Payer: Self-pay | Admitting: Emergency Medicine

## 2016-05-10 ENCOUNTER — Emergency Department (HOSPITAL_COMMUNITY): Payer: Medicare Other

## 2016-05-10 ENCOUNTER — Emergency Department (HOSPITAL_COMMUNITY)
Admission: EM | Admit: 2016-05-10 | Discharge: 2016-05-10 | Disposition: A | Discharge status: Home / Self Care | Payer: Medicare Other | Attending: Emergency Medicine | Admitting: Emergency Medicine

## 2016-05-10 DIAGNOSIS — J45909 Unspecified asthma, uncomplicated: Secondary | ICD-10-CM | POA: Insufficient documentation

## 2016-05-10 DIAGNOSIS — E1159 Type 2 diabetes mellitus with other circulatory complications: Secondary | ICD-10-CM | POA: Diagnosis not present

## 2016-05-10 DIAGNOSIS — Z7984 Long term (current) use of oral hypoglycemic drugs: Secondary | ICD-10-CM | POA: Diagnosis not present

## 2016-05-10 DIAGNOSIS — F17210 Nicotine dependence, cigarettes, uncomplicated: Secondary | ICD-10-CM | POA: Insufficient documentation

## 2016-05-10 DIAGNOSIS — G8929 Other chronic pain: Secondary | ICD-10-CM | POA: Insufficient documentation

## 2016-05-10 DIAGNOSIS — M545 Low back pain, unspecified: Secondary | ICD-10-CM

## 2016-05-10 DIAGNOSIS — I10 Essential (primary) hypertension: Secondary | ICD-10-CM | POA: Insufficient documentation

## 2016-05-10 MED ORDER — KETOROLAC TROMETHAMINE 60 MG/2ML IM SOLN
30.0000 mg | Freq: Once | INTRAMUSCULAR | Status: AC
  Administered 2016-05-10: 30 mg via INTRAMUSCULAR
  Filled 2016-05-10: qty 2

## 2016-05-10 MED ORDER — DIAZEPAM 5 MG PO TABS: 5.0000 mg | ORAL_TABLET | Freq: Two times a day (BID) | ORAL | 0 refills | Status: DC

## 2016-05-10 MED ORDER — DICLOFENAC SODIUM 50 MG PO TBEC: 50.0000 mg | DELAYED_RELEASE_TABLET | Freq: Two times a day (BID) | ORAL | 0 refills | Status: DC

## 2016-05-10 NOTE — ED Provider Notes (Signed)
[facility]-EMERGENCY DEPT Provider Note   CSN: [id number] Arrival date & time: 05/10/16  1749  By signing my name below, I, Cynda [place], attest that this documentation has been prepared under the direction and in the presence of Kerrie Buffalo, NP. Electronically Signed: [place]da [place], Scribe. 05/10/16. 8:21 PM.  History   Chief Complaint Chief Complaint  Patient presents with  . Back Pain  . Neck Pain  . Knee Pain    HPI Comments: Christian Bowen is a 56 y.o. male with a history of lumbar post-laminectomy syndrome with a implant in place, who presents to the Emergency Department complaining of sudden-onset, constant lower back pain that began 2 days ago. Patient reports having back surgery a few years ago in [place]. Patient states he is supposed to have his stimulator replaced every 7 years, has not seen anyone since 2014. Patient states his stimulator usually relieves his back pain, but is not currently. Patient reports associated neck pain that radiates to the left shoulder (chronic). Patient reports taking flexeril (last dose last night), ibuprofen (last dose at 4AM), and tylenol with no relief. Patient states his pain is worse when applying pressure to the area. Patient states he was told he has two bulging discs in his back. Patient denies any fever, nausea, vomiting, abdominal pain, numbness, or weakness.   Per nurse, the patient stated that he has been in chronic pain management in the past but could not go back.  The history is provided by the patient. No language interpreter was used.    Past Medical History:  Diagnosis Date  . Apnea, sleep    was tested 2005  . Asthma   . Diabetes mellitus   . Hyperlipemia 10/12/2013  . Hypertension   . Lumbar post-laminectomy syndrome 04/02/2013  . Obesity   . Pain in joint, shoulder region 10/12/2013  . Sleep apnea    CPAP-has had UPPP-tonsils and turb red  . Tobacco abuse 03/13/2013    Patient Active Problem List   Diagnosis Date Noted  .  Essential hypertension, benign 10/12/2013  . Hyperlipemia 10/12/2013  . DM (diabetes mellitus), secondary, uncontrolled, with peripheral vascular complications (HCC) 10/12/2013  . Pain in joint, shoulder region 10/12/2013  . Lumbar post-laminectomy syndrome 04/02/2013  . Obstructive sleep apnea 03/13/2013  . Morbid obesity (HCC) 03/13/2013  . Asthma, moderate persistent 03/13/2013  . Tobacco abuse 03/13/2013    Past Surgical History:  Procedure Laterality Date  . BACK SURGERY  2009  . DORSAL COMPARTMENT RELEASE Left 08/13/2012   Procedure: RELEASE DORSAL COMPARTMENT (DEQUERVAIN) and exploration of radial nerve branch;  Surgeon: Tami Ribas, MD;  Location: [facility];  Service: Orthopedics;  Laterality: Left;  . GANGLION CYST EXCISION  2/14-SCG   lt wrist   . GANGLION CYST EXCISION Left 08/13/2012   Procedure: REMOVAL GANGLION OF WRIST;  Surgeon: Tami Ribas, MD;  Location: [facility];  Service: Orthopedics;  Laterality: Left;  . heel spurs     bilateral feel  . NASAL TURBINATE REDUCTION  8/13   septoplasty-UPPP-tonsils  . SHOULDER ARTHROSCOPY Right 07/02/2013   Procedure: RIGHT SHOULDER ARTHROSCOPY WITH EXTENSIVE DEBRIDEMENT OF ROTATOR CUFF AND LABRUM, SUBACROMIAL DECOMPRESSION, PARTIAL ACROMIOPLASTY WITH CORACOACROMIAL RELEASE;  Surgeon: Thera Flake., MD;  Location: [facility];  Service: Orthopedics;  Laterality: Right;  . SHOULDER ARTHROSCOPY WITH BICEPSTENOTOMY Right 04/15/2014   Procedure: SHOULDER ARTHROSCOPY WITH BICEPSTENOTOMY;  Surgeon: Frederico Hamman, MD;  Location: [facility];  Service: Orthopedics;  Laterality: Right;  . SPINAL CORD STIMULATOR IMPLANT  1997  . SPINAL CORD STIMULATOR INSERTION N/A 04/02/2013   Procedure: LUMBAR SPINAL CORD STIMULATOR Insertion;  Surgeon: Gwynne Edinger, MD;  Location: [facility];  Service: Neurosurgery;  Laterality: N/A;  . TONSILLECTOMY         Home Medications    Prior  to Admission medications   Medication Sig Start Date End Date Taking? Authorizing Provider  albuterol (PROVENTIL) (2.5 MG/3ML) 0.083% nebulizer solution Take 3 mLs (2.5 mg total) by nebulization every 6 (six) hours as needed for wheezing or shortness of breath. Patient not taking: Reported on 08/31/2015 11/09/14   Waymon Budge, MD  atorvastatin (LIPITOR) 40 MG tablet Take 40 mg by mouth daily.    Historical Provider, MD  benzonatate (TESSALON) 100 MG capsule Take 1 capsule (100 mg total) by mouth every 8 (eight) hours. 05/08/16   Junius Finner, PA-C  BREO ELLIPTA 100-25 MCG/INH AEPB take 1 inhalation by mouth once daily AND THEN RINSE MOUTH 05/02/16   Waymon Budge, MD  diazepam (VALIUM) 5 MG tablet Take 1 tablet (5 mg total) by mouth 2 (two) times daily. 05/10/16   Easter Schinke Orlene Och, NP  diclofenac (VOLTAREN) 50 MG EC tablet Take 1 tablet (50 mg total) by mouth 2 (two) times daily. 05/10/16   Flannery Cavallero Orlene Och, NP  glimepiride (AMARYL) 4 MG tablet Take 4 mg by mouth daily with breakfast.    Historical Provider, MD  HYDROcodone-acetaminophen (NORCO) 5-325 MG tablet Take 1 tablet by mouth every 8 (eight) hours as needed for severe pain. 11/20/15   Elvina Sidle, MD  lisinopril-hydrochlorothiazide (PRINZIDE,ZESTORETIC) 20-25 MG per tablet Take 1 tablet by mouth daily.    Historical Provider, MD  meloxicam (MOBIC) 15 MG tablet Take 1 tablet (15 mg total) by mouth daily. 05/08/16   Junius Finner, PA-C  PROAIR HFA 108 [phone] Base) MCG/ACT inhaler inhale 2 puffs by mouth every 6 hours 02/26/16   Waymon Budge, MD  sitaGLIPtan-metformin (JANUMET) 50-1000 MG per tablet Take 1 tablet by mouth 2 (two) times daily with a meal.    Historical Provider, MD    Family History Family History  Problem Relation Age of Onset  . Hypertension Mother   . Heart attack Father     Social History Social History  Substance Use Topics  . Smoking status: Current Some Day Smoker    Packs/day: 0.25    Years: 30.00    Types:  Cigarettes  . Smokeless tobacco: Never Used     Comment: 1 pack will last him 2 weeks   . Alcohol use 0.0 oz/week     Comment: 1 pint over the weekend...its not all the time     Allergies   Hydromorphone hcl   Review of Systems Review of Systems  Constitutional: Negative for fever.  Cardiovascular: Negative for leg swelling.  Gastrointestinal: Negative for abdominal pain, nausea and vomiting.  Musculoskeletal: Positive for arthralgias (left shoulder), back pain (lower) and neck pain.  Skin: Negative for wound.  Neurological: Negative for weakness and numbness.     Physical Exam Updated Vital Signs BP 128/73 (BP Location: Right Arm)   Pulse 99   Temp 98.4 F (36.9 C) (Oral)   Resp 16   Ht 6' (1.829 m)   Wt (!) 139.3 kg   SpO2 100%   BMI 41.64 kg/m   Physical Exam  Constitutional: He is oriented to person, place, and time. No distress.  obese  Eyes: EOM  are normal.  Neck: Neck supple.  Tenderness to the muscular area on the left  Cardiovascular: Normal rate, regular rhythm, normal heart sounds and intact distal pulses.   Radial pulses 2+, adequate circulation.   Pulmonary/Chest: Effort normal and breath sounds normal.  Abdominal: Soft. There is no tenderness.  Musculoskeletal: Normal range of motion. He exhibits tenderness. He exhibits no edema or deformity.  Tenderness to the lower lumbar area and over the lumbar spine.   Neurological: He is alert and oriented to person, place, and time. He has normal strength and normal reflexes. No cranial nerve deficit. Gait normal.  Skin: Skin is warm and dry.  Psychiatric: He has a normal mood and affect. His behavior is normal.  Nursing note and vitals reviewed.    ED Treatments / Results  DIAGNOSTIC STUDIES: Oxygen Saturation is 97% on RA, normal by my interpretation.    COORDINATION OF CARE: 8:21 PM Discussed treatment plan with pt at bedside and pt agreed to plan, which includes imaging and pain medication.    Labs (all labs ordered are listed, but only abnormal results are displayed) Labs Reviewed - No data to display   Radiology Dg Lumbar Spine Complete  Result Date: 05/10/2016 CLINICAL DATA:  Low back pain for 2 days. EXAM: LUMBAR SPINE - COMPLETE 4+ VIEW COMPARISON:  04/10/2011 FINDINGS: There is an implanted stimulator with leads extending up from L2 into the lower thoracic central canal. There is minimal stable anterior wedging of L1. The other lumbar vertebrae are normal in height. Moderate degenerative lumbar disc changes are present, greatest at L5-S1, slightly worsened. No bone lesion or bony destruction. Facet articulations are intact. There is no spondylolysis or spondylolisthesis. Sacroiliac joints are unremarkable. IMPRESSION: Moderate lumbar degenerative changes.  No acute findings. Electronically Signed   By: Ellery Plunk M.D.   On: 05/10/2016 21:10    Procedures Procedures (including critical care time)  Medications Ordered in ED Medications  ketorolac (TORADOL) injection 30 mg (30 mg Intramuscular Given 05/10/16 2036)     Initial Impression / Assessment and Plan / ED Course  I have reviewed the triage vital signs and the nursing notes.  Pertinent imaging results that were available during my care of the patient were reviewed by me and considered in my medical decision making (see chart for details).   Final Clinical Impressions(s) / ED Diagnoses  56 y.o. male with hx of chronic low back pain here tonight with increased pain over the past few days. Patient stable for d/c without neuro deficits. Will treat with muscle relaxant and NSAIDS. Discussed with the patient importance of f/u with a PCP and also ortho referral given. All questioned fully answered  Final diagnoses:  Acute exacerbation of chronic low back pain    New Prescriptions Discharge Medication List as of 05/10/2016 10:10 PM    START taking these medications   Details  diclofenac (VOLTAREN) 50 MG EC  tablet Take 1 tablet (50 mg total) by mouth 2 (two) times daily., Starting Fri 05/10/2016, Print      I personally performed the services described in this documentation, which was scribed in my presence. The recorded information has been reviewed and is accurate.    [address] [place], [place] 05/10/16 1610    Bethann Berkshire, MD 05/11/16 [phone]

## 2016-05-10 NOTE — ED Notes (Signed)
Pt verbalized understanding discharge instructions and denies any further needs or questions at this time. VS stable, ambulatory and steady gait.   

## 2016-05-10 NOTE — ED Triage Notes (Signed)
Pt sts lower back pain, neck pain and left knee pain; pt sts URI sx

## 2016-05-21 ENCOUNTER — Encounter: Payer: Self-pay | Admitting: Internal Medicine

## 2016-05-21 ENCOUNTER — Ambulatory Visit (INDEPENDENT_AMBULATORY_CARE_PROVIDER_SITE_OTHER)
Admission: RE | Admit: 2016-05-21 | Discharge: 2016-05-21 | Disposition: A | Discharge status: Home / Self Care | Payer: Medicare Other | Source: Ambulatory Visit | Attending: Internal Medicine | Admitting: Internal Medicine

## 2016-05-21 ENCOUNTER — Ambulatory Visit (INDEPENDENT_AMBULATORY_CARE_PROVIDER_SITE_OTHER): Payer: Medicare Other | Admitting: Internal Medicine

## 2016-05-21 VITALS — BP 128/84 | HR 90 | Ht 72.0 in | Wt 314.0 lb

## 2016-05-21 DIAGNOSIS — J4541 Moderate persistent asthma with (acute) exacerbation: Secondary | ICD-10-CM

## 2016-05-21 DIAGNOSIS — J449 Chronic obstructive pulmonary disease, unspecified: Secondary | ICD-10-CM | POA: Diagnosis not present

## 2016-05-21 DIAGNOSIS — Z720 Tobacco use: Secondary | ICD-10-CM

## 2016-05-21 DIAGNOSIS — G4733 Obstructive sleep apnea (adult) (pediatric): Secondary | ICD-10-CM | POA: Diagnosis not present

## 2016-05-21 MED ORDER — ALBUTEROL SULFATE HFA 108 (90 BASE) MCG/ACT IN AERS: INHALATION_SPRAY | RESPIRATORY_TRACT | 99 refills | Status: DC

## 2016-05-21 MED ORDER — LEVALBUTEROL HCL 0.63 MG/3ML IN NEBU
0.6300 mg | INHALATION_SOLUTION | Freq: Once | RESPIRATORY_TRACT | Status: AC
  Administered 2016-05-21: 0.63 mg via RESPIRATORY_TRACT

## 2016-05-21 MED ORDER — FLUTICASONE FUROATE-VILANTEROL 100-25 MCG/INH IN AEPB: INHALATION_SPRAY | RESPIRATORY_TRACT | 12 refills | Status: DC

## 2016-05-21 MED ORDER — ALBUTEROL SULFATE (2.5 MG/3ML) 0.083% IN NEBU: 2.5000 mg | INHALATION_SOLUTION | Freq: Four times a day (QID) | RESPIRATORY_TRACT | 99 refills | Status: DC | PRN

## 2016-05-21 NOTE — Progress Notes (Signed)
HPI M smoker followed for OSA, s/p UPPP, tobacco abuse, complicated by HBP, DM 2, tobacco, morbid obesity, cervical neuralgia, back pain NPSG elsewhere prior to 2005. CPAP. Subsequent weight gain 100 lbs. PFT: 05/04/13-mild to moderate obstructive airways disease, insignificant response to bronchodilator, normal diffusion, minimal restriction. FVC 3.52/78%, FEV1 2.56/72%, FEV1/FVC 0.73/91%, FEF 25-75% 1.74/51%. TLC 73%, DLCO 108%.  ----------------------------------------------------------------------------------------------------------  10/5./16- 53 yoM smoker followed for OSA, s/p UPPP, tobacco abuse NPSG done 10 years ago-original report not yet found CPAP 16/[facility] He likes Breo inhaler well, only needing occasional rescue inhaler. CPAP does well but causing dry mouth which we discussed. 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  05/21/2016- 56 year old male smoker followed for OSA,  s/p UPPP, tobacco abuse, complicated by HBP, DM 2, tobacco, morbid obesity CPAP 16/[facility] For OSA. Last seen in 2016 for OSA. States he does not use CPAP often because it dries his mouth and nose out. Uses [facility].  Recent incidental coryza he minimizes. Has not been using CPAP regularly. Complains of dry mouth" no steam coming out of the tube".   Office Spirometry 05/21/2016 mild obstruction small airways-FVC 3.57/81%, FEV1 2.51/72%, ratio 0.70, FEF 25-75%  1.65/ 50% A pack of cigarettes last 2 or 3 weeks. He denies routine cough but has felt hoarse with his cold. Needs his inhalers refilled, old nebulizer machine replaced. CXR 05/08/16 IMPRESSION: No active cardiopulmonary disease.  ROS-see HPI Constitutional:   No-   weight loss, night sweats, fevers, chills, +fatigue, lassitude. HEENT:   No-  headaches, difficulty swallowing, tooth/dental problems, sore throat,       No-  sneezing, itching, ear ache, nasal congestion, post nasal drip,  CV:  No-    chest pain, orthopnea, PND, swelling in lower extremities, anasarca,                                                          dizziness, palpitations Resp: No-   shortness of breath with exertion or at rest.              No-   productive cough,  + non-productive cough,  No- coughing up of blood.              No-   change in color of mucus.  + wheezing.   Skin: No-   rash or lesions. GI:  No-   heartburn, indigestion, abdominal pain, nausea, vomiting,  GU:  MS:  No-   joint pain or swelling.    + back pain. Neuro-     nothing unusual Psych:  No- change in mood or affect. No depression or anxiety.  No memory loss.  OBJ- Physical Exam General- Alert, Oriented, Affect-appropriate, Distress- none acute, +obese Skin- rash-none, lesions- none, excoriation- none Lymphadenopathy- none Head- atraumatic            Eyes- Gross vision intact, PERRLA, conjunctivae and secretions clear            Ears- Hearing, canals-normal            Nose- Clear, no-Septal dev, mucus, polyps, erosion, perforation             Throat- Mallampati II/ UPPP , mucosa clear , drainage- none, tonsils- atrophic, + hoarse Neck- flexible , trachea midline, no stridor , thyroid  nl, carotid no bruit Chest - symmetrical excursion , unlabored           Heart/CV- RRR , no murmur , no gallop  , no rub, nl s1 s2                           - JVD- none , edema- none, stasis changes- none, varices- none           Lung-  + trace/ unlabored, cough- none , dullness-none, rub- none           Chest wall-  Abd-  Br/ Gen/ Rectal- Not done, not indicated Extrem- cyanosis- none, clubbing, none, atrophy- none, strength- nl Neuro- grossly intact to observation

## 2016-05-21 NOTE — Assessment & Plan Note (Signed)
I think he is out of inhalers in addition to having a cold. Plan-refill his rescue and maintenance bronchodilators. Replace old nebulizer machine using albuterol neb solution. Update CXR. Office spirometry reviewed showing mild small airway obstruction

## 2016-05-21 NOTE — Patient Instructions (Signed)
Refill scripts sent for nebulizer solution, Proair rescue inhaler and Breo maintenance inhaler  Order DME [facility]- Continue CPAP 16, mask of choice, humidifier, supplies, AirView  dxOSA  Order- DME [facility]- replacement for old nebulizer machine. He would like one with portable battery pack.    Dx COPD mixed type  Order- CXR    Dx COPD mixed type  Order- Office spirometry     Order- neb xop 0.63

## 2016-05-21 NOTE — Assessment & Plan Note (Signed)
Inadequate compliance. We discussed use of Biotene and will ask [facility] to help him with his humidifier and supplies to continue CPAP. We may need to update sleep study.

## 2016-05-21 NOTE — Assessment & Plan Note (Signed)
Importance of smoking cessation was emphasized. He is trying to use nicotine gum, occasionally.

## 2016-05-21 NOTE — Assessment & Plan Note (Signed)
Meaningful weight loss would help his situation considerably. Consider bariatric referral.

## 2016-08-18 ENCOUNTER — Emergency Department (HOSPITAL_COMMUNITY): Payer: Medicare Other

## 2016-08-18 ENCOUNTER — Observation Stay (HOSPITAL_COMMUNITY): Payer: Medicare Other

## 2016-08-18 ENCOUNTER — Encounter (HOSPITAL_COMMUNITY): Payer: Self-pay | Admitting: *Deleted

## 2016-08-18 ENCOUNTER — Observation Stay (HOSPITAL_COMMUNITY)
Admission: EM | Admit: 2016-08-18 | Discharge: 2016-08-19 | Disposition: A | Discharge status: Home / Self Care | Payer: Medicare Other | Attending: Family Medicine | Admitting: Family Medicine

## 2016-08-18 DIAGNOSIS — Z79899 Other long term (current) drug therapy: Secondary | ICD-10-CM | POA: Diagnosis not present

## 2016-08-18 DIAGNOSIS — E0851 Diabetes mellitus due to underlying condition with diabetic peripheral angiopathy without gangrene: Secondary | ICD-10-CM | POA: Insufficient documentation

## 2016-08-18 DIAGNOSIS — E785 Hyperlipidemia, unspecified: Secondary | ICD-10-CM | POA: Diagnosis not present

## 2016-08-18 DIAGNOSIS — F1010 Alcohol abuse, uncomplicated: Secondary | ICD-10-CM | POA: Diagnosis not present

## 2016-08-18 DIAGNOSIS — I10 Essential (primary) hypertension: Secondary | ICD-10-CM | POA: Diagnosis not present

## 2016-08-18 DIAGNOSIS — J4540 Moderate persistent asthma, uncomplicated: Secondary | ICD-10-CM | POA: Diagnosis present

## 2016-08-18 DIAGNOSIS — G4733 Obstructive sleep apnea (adult) (pediatric): Secondary | ICD-10-CM | POA: Diagnosis present

## 2016-08-18 DIAGNOSIS — Z7984 Long term (current) use of oral hypoglycemic drugs: Secondary | ICD-10-CM | POA: Diagnosis not present

## 2016-08-18 DIAGNOSIS — F17210 Nicotine dependence, cigarettes, uncomplicated: Secondary | ICD-10-CM | POA: Diagnosis not present

## 2016-08-18 DIAGNOSIS — K859 Acute pancreatitis without necrosis or infection, unspecified: Secondary | ICD-10-CM | POA: Diagnosis present

## 2016-08-18 DIAGNOSIS — E1365 Other specified diabetes mellitus with hyperglycemia: Secondary | ICD-10-CM | POA: Diagnosis present

## 2016-08-18 DIAGNOSIS — Z6841 Body Mass Index (BMI) 40.0 and over, adult: Secondary | ICD-10-CM | POA: Insufficient documentation

## 2016-08-18 DIAGNOSIS — Z720 Tobacco use: Secondary | ICD-10-CM | POA: Diagnosis present

## 2016-08-18 DIAGNOSIS — IMO0002 Reserved for concepts with insufficient information to code with codable children: Secondary | ICD-10-CM | POA: Diagnosis present

## 2016-08-18 DIAGNOSIS — E1351 Other specified diabetes mellitus with diabetic peripheral angiopathy without gangrene: Secondary | ICD-10-CM | POA: Diagnosis present

## 2016-08-18 DIAGNOSIS — R1011 Right upper quadrant pain: Secondary | ICD-10-CM

## 2016-08-18 LAB — COMPREHENSIVE METABOLIC PANEL
ALBUMIN: 3.4 g/dL — AB (ref 3.5–5.0)
ALT: 22 U/L (ref 17–63)
ANION GAP: 6 (ref 5–15)
AST: 24 U/L (ref 15–41)
Alkaline Phosphatase: 60 U/L (ref 38–126)
BILIRUBIN TOTAL: 0.8 mg/dL (ref 0.3–1.2)
BUN: 10 mg/dL (ref 6–20)
CO2: 28 mmol/L (ref 22–32)
Calcium: 9.2 mg/dL (ref 8.9–10.3)
Chloride: 103 mmol/L (ref 101–111)
Creatinine, Ser: 0.93 mg/dL (ref 0.61–1.24)
GFR calc Af Amer: >60 mL/min (ref >60)
GFR calc non Af Amer: >60 mL/min (ref >60)
Glucose, Bld: 161 mg/dL — ABNORMAL HIGH (ref 65–99)
POTASSIUM: 3.9 mmol/L (ref 3.5–5.1)
SODIUM: 137 mmol/L (ref 135–145)
TOTAL PROTEIN: 7.1 g/dL (ref 6.5–8.1)

## 2016-08-18 LAB — RAPID URINE DRUG SCREEN, HOSP PERFORMED
Amphetamines: NOT DETECTED
BARBITURATES: NOT DETECTED
Benzodiazepines: NOT DETECTED
COCAINE: POSITIVE — AB
Opiates: NOT DETECTED
TETRAHYDROCANNABINOL: NOT DETECTED

## 2016-08-18 LAB — CBC
HCT: 40.1 % (ref 39.0–52.0)
HEMATOCRIT: 42.7 % (ref 39.0–52.0)
HEMOGLOBIN: 14.1 g/dL (ref 13.0–17.0)
Hemoglobin: 12.7 g/dL — ABNORMAL LOW (ref 13.0–17.0)
MCH: 31.1 pg (ref 26.0–34.0)
MCH: 29.7 pg (ref 26.0–34.0)
MCHC: 33 g/dL (ref 30.0–36.0)
MCHC: 31.7 g/dL (ref 30.0–36.0)
MCV: 94.1 fL (ref 78.0–100.0)
MCV: 93.9 fL (ref 78.0–100.0)
Platelets: 149 10*3/uL — ABNORMAL LOW (ref 150–400)
Platelets: 150 10*3/uL (ref 150–400)
RBC: 4.54 MIL/uL (ref 4.22–5.81)
RBC: 4.27 MIL/uL (ref 4.22–5.81)
RDW: 13.5 % (ref 11.5–15.5)
RDW: 13.8 % (ref 11.5–15.5)
WBC: 9.2 10*3/uL (ref 4.0–10.5)
WBC: 7.4 10*3/uL (ref 4.0–10.5)

## 2016-08-18 LAB — URINALYSIS, ROUTINE W REFLEX MICROSCOPIC
Bilirubin Urine: NEGATIVE
Glucose, UA: NEGATIVE mg/dL
Hgb urine dipstick: NEGATIVE
Ketones, ur: NEGATIVE mg/dL
LEUKOCYTES UA: NEGATIVE
Nitrite: NEGATIVE
PH: 5 (ref 5.0–8.0)
Protein, ur: NEGATIVE mg/dL
SPECIFIC GRAVITY, URINE: 1.021 (ref 1.005–1.030)

## 2016-08-18 LAB — LIPASE, BLOOD: Lipase: 350 U/L — ABNORMAL HIGH (ref 11–51)

## 2016-08-18 LAB — CREATININE, SERUM
CREATININE: 0.92 mg/dL (ref 0.61–1.24)
GFR calc Af Amer: >60 mL/min (ref >60)

## 2016-08-18 LAB — GLUCOSE, CAPILLARY
GLUCOSE-CAPILLARY: 111 mg/dL — AB (ref 65–99)
GLUCOSE-CAPILLARY: 157 mg/dL — AB (ref 65–99)
GLUCOSE-CAPILLARY: 182 mg/dL — AB (ref 65–99)

## 2016-08-18 MED ORDER — HYDRALAZINE HCL 20 MG/ML IJ SOLN: 10.0000 mg | Freq: Three times a day (TID) | INTRAMUSCULAR | Status: DC | PRN

## 2016-08-18 MED ORDER — THIAMINE HCL 100 MG/ML IJ SOLN: 100.0000 mg | Freq: Every day | INTRAMUSCULAR | Status: DC

## 2016-08-18 MED ORDER — LORAZEPAM 1 MG PO TABS: 1.0000 mg | ORAL_TABLET | Freq: Four times a day (QID) | ORAL | Status: DC | PRN

## 2016-08-18 MED ORDER — ALBUTEROL SULFATE (2.5 MG/3ML) 0.083% IN NEBU: 2.5000 mg | INHALATION_SOLUTION | Freq: Four times a day (QID) | RESPIRATORY_TRACT | Status: DC | PRN

## 2016-08-18 MED ORDER — ACETAMINOPHEN 325 MG PO TABS
650.0000 mg | ORAL_TABLET | Freq: Four times a day (QID) | ORAL | Status: DC | PRN
  Filled 2016-08-18: qty 2

## 2016-08-18 MED ORDER — ACETAMINOPHEN 650 MG RE SUPP: 650.0000 mg | Freq: Four times a day (QID) | RECTAL | Status: DC | PRN

## 2016-08-18 MED ORDER — SODIUM CHLORIDE 0.9 % IV BOLUS (SEPSIS)
1000.0000 mL | Freq: Once | INTRAVENOUS | Status: AC
  Administered 2016-08-18: 1000 mL via INTRAVENOUS

## 2016-08-18 MED ORDER — VITAMIN B-1 100 MG PO TABS
100.0000 mg | ORAL_TABLET | Freq: Every day | ORAL | Status: DC
  Administered 2016-08-18 – 2016-08-19 (×2): 100 mg via ORAL
  Filled 2016-08-18 (×2): qty 1

## 2016-08-18 MED ORDER — NICOTINE 7 MG/24HR TD PT24: 7.0000 mg | MEDICATED_PATCH | Freq: Every day | TRANSDERMAL | Status: DC | PRN

## 2016-08-18 MED ORDER — IOPAMIDOL (ISOVUE-300) INJECTION 61%
INTRAVENOUS | Status: AC
  Administered 2016-08-18: 100 mL
  Filled 2016-08-18: qty 100

## 2016-08-18 MED ORDER — ADULT MULTIVITAMIN W/MINERALS CH
1.0000 | ORAL_TABLET | Freq: Every day | ORAL | Status: DC
  Administered 2016-08-18 – 2016-08-19 (×2): 1 via ORAL
  Filled 2016-08-18 (×2): qty 1

## 2016-08-18 MED ORDER — SODIUM CHLORIDE 0.9% FLUSH
3.0000 mL | Freq: Two times a day (BID) | INTRAVENOUS | Status: DC
  Administered 2016-08-18 – 2016-08-19 (×2): 3 mL via INTRAVENOUS

## 2016-08-18 MED ORDER — MORPHINE SULFATE (PF) 4 MG/ML IV SOLN
6.0000 mg | Freq: Once | INTRAVENOUS | Status: AC
  Administered 2016-08-18: 6 mg via INTRAVENOUS
  Filled 2016-08-18: qty 2

## 2016-08-18 MED ORDER — MORPHINE SULFATE (PF) 4 MG/ML IV SOLN
4.0000 mg | INTRAVENOUS | Status: DC | PRN
Start: 2016-08-18 — End: 2016-08-19
  Administered 2016-08-18 (×2): 4 mg via INTRAVENOUS
  Filled 2016-08-18 (×2): qty 1

## 2016-08-18 MED ORDER — ONDANSETRON HCL 4 MG/2ML IJ SOLN
4.0000 mg | Freq: Four times a day (QID) | INTRAMUSCULAR | Status: DC | PRN
  Administered 2016-08-18: 4 mg via INTRAVENOUS
  Filled 2016-08-18: qty 2

## 2016-08-18 MED ORDER — PANTOPRAZOLE SODIUM 40 MG IV SOLR
40.0000 mg | INTRAVENOUS | Status: DC
  Administered 2016-08-18 – 2016-08-19 (×2): 40 mg via INTRAVENOUS
  Filled 2016-08-18 (×2): qty 40

## 2016-08-18 MED ORDER — ONDANSETRON HCL 4 MG/2ML IJ SOLN
4.0000 mg | Freq: Once | INTRAMUSCULAR | Status: AC
  Administered 2016-08-18: 4 mg via INTRAVENOUS
  Filled 2016-08-18: qty 2

## 2016-08-18 MED ORDER — ONDANSETRON HCL 4 MG PO TABS
4.0000 mg | ORAL_TABLET | Freq: Four times a day (QID) | ORAL | Status: DC | PRN
  Filled 2016-08-18: qty 1

## 2016-08-18 MED ORDER — SODIUM CHLORIDE 0.9 % IV SOLN
INTRAVENOUS | Status: DC
  Administered 2016-08-18 – 2016-08-19 (×4): via INTRAVENOUS

## 2016-08-18 MED ORDER — INSULIN ASPART 100 UNIT/ML ~~LOC~~ SOLN
0.0000 [IU] | SUBCUTANEOUS | Status: DC
  Administered 2016-08-18: 4 [IU] via SUBCUTANEOUS
  Administered 2016-08-19: 3 [IU] via SUBCUTANEOUS
  Administered 2016-08-19: 4 [IU] via SUBCUTANEOUS
  Administered 2016-08-19: 3 [IU] via SUBCUTANEOUS

## 2016-08-18 MED ORDER — FLUTICASONE FUROATE-VILANTEROL 100-25 MCG/INH IN AEPB
1.0000 | INHALATION_SPRAY | Freq: Every day | RESPIRATORY_TRACT | Status: DC
  Administered 2016-08-18 – 2016-08-19 (×2): 1 via RESPIRATORY_TRACT
  Filled 2016-08-18 (×2): qty 28

## 2016-08-18 MED ORDER — ENOXAPARIN SODIUM 40 MG/0.4ML ~~LOC~~ SOLN
40.0000 mg | SUBCUTANEOUS | Status: DC
  Administered 2016-08-18 – 2016-08-19 (×2): 40 mg via SUBCUTANEOUS
  Filled 2016-08-18 (×3): qty 0.4

## 2016-08-18 MED ORDER — LORAZEPAM 2 MG/ML IJ SOLN: 1.0000 mg | Freq: Four times a day (QID) | INTRAMUSCULAR | Status: DC | PRN

## 2016-08-18 MED ORDER — FOLIC ACID 1 MG PO TABS
1.0000 mg | ORAL_TABLET | Freq: Every day | ORAL | Status: DC
  Administered 2016-08-18 – 2016-08-19 (×2): 1 mg via ORAL
  Filled 2016-08-18 (×2): qty 1

## 2016-08-18 MED ORDER — ZOLPIDEM TARTRATE 5 MG PO TABS
5.0000 mg | ORAL_TABLET | Freq: Every evening | ORAL | Status: DC | PRN
  Administered 2016-08-18: 5 mg via ORAL
  Filled 2016-08-18: qty 1

## 2016-08-18 MED ORDER — KETOROLAC TROMETHAMINE 30 MG/ML IJ SOLN: 30.0000 mg | Freq: Three times a day (TID) | INTRAMUSCULAR | Status: DC

## 2016-08-18 NOTE — ED Provider Notes (Signed)
[facility]-EMERGENCY DEPT Provider Note   CSN: [id number] Arrival date & time: 08/18/16  0423     History   Chief Complaint Chief Complaint  Patient presents with  . Abdominal Pain    HPI Christian Bowen is a 56 y.o. male.  HPI  56 y.o. male with a hx of DM, HTN, HLD, presents to the Emergency Department today due to RUQ abdominal pain x 2 days. Notes pain is constant and rates 8/10. Aching sensation. No hx same. Notes nausea without emesis. No worsening with PO intake. Pt attempted Mylanta and stool softeners with minimal relief. No CP/SOB. No fevers. No URI symptoms. Pt states he has been having regular BMs. No diarrhea. Of note, pt drank 3-4 pints of mixed ETOH 3-4 days ago prior to symptoms. Pt is not a regular drinker. No other symptoms noted.   Past Medical History:  Diagnosis Date  . Apnea, sleep    was tested 2005  . Asthma   . Diabetes mellitus   . Hyperlipemia 10/12/2013  . Hypertension   . Lumbar post-laminectomy syndrome 04/02/2013  . Obesity   . Pain in joint, shoulder region 10/12/2013  . Sleep apnea    CPAP-has had UPPP-tonsils and turb red  . Tobacco abuse 03/13/2013    Patient Active Problem List   Diagnosis Date Noted  . Essential hypertension, benign 10/12/2013  . Hyperlipemia 10/12/2013  . DM (diabetes mellitus), secondary, uncontrolled, with peripheral vascular complications (HCC) 10/12/2013  . Pain in joint, shoulder region 10/12/2013  . Lumbar post-laminectomy syndrome 04/02/2013  . Obstructive sleep apnea 03/13/2013  . Morbid obesity (HCC) 03/13/2013  . Asthma, moderate persistent 03/13/2013  . Tobacco abuse 03/13/2013    Past Surgical History:  Procedure Laterality Date  . BACK SURGERY  2009  . DORSAL COMPARTMENT RELEASE Left 08/13/2012   Procedure: RELEASE DORSAL COMPARTMENT (DEQUERVAIN) and exploration of radial nerve branch;  Surgeon: Tami RibasKevin R Kuzma, MD;  Location: [facility];  Service: Orthopedics;  Laterality: Left;  . GANGLION CYST  EXCISION  2/14-SCG   lt wrist   . GANGLION CYST EXCISION Left 08/13/2012   Procedure: REMOVAL GANGLION OF WRIST;  Surgeon: Tami RibasKevin R Kuzma, MD;  Location: [facility];  Service: Orthopedics;  Laterality: Left;  . heel spurs     bilateral feel  . NASAL TURBINATE REDUCTION  8/13   septoplasty-UPPP-tonsils  . SHOULDER ARTHROSCOPY Right 07/02/2013   Procedure: RIGHT SHOULDER ARTHROSCOPY WITH EXTENSIVE DEBRIDEMENT OF ROTATOR CUFF AND LABRUM, SUBACROMIAL DECOMPRESSION, PARTIAL ACROMIOPLASTY WITH CORACOACROMIAL RELEASE;  Surgeon: Thera FlakeW D Caffrey Jr., MD;  Location: [facility];  Service: Orthopedics;  Laterality: Right;  . SHOULDER ARTHROSCOPY WITH BICEPSTENOTOMY Right 04/15/2014   Procedure: SHOULDER ARTHROSCOPY WITH BICEPSTENOTOMY;  Surgeon: Frederico Hammananiel Caffrey, MD;  Location: [facility];  Service: Orthopedics;  Laterality: Right;  . SPINAL CORD STIMULATOR IMPLANT  1997  . SPINAL CORD STIMULATOR INSERTION N/A 04/02/2013   Procedure: LUMBAR SPINAL CORD STIMULATOR Insertion;  Surgeon: Gwynne EdingerPaul C Harkins, MD;  Location: [facility];  Service: Neurosurgery;  Laterality: N/A;  . TONSILLECTOMY         Home Medications    Prior to Admission medications   Medication Sig Start Date End Date Taking? Authorizing Provider  albuterol (PROAIR HFA) 108 (90 Base) MCG/ACT inhaler inhale 2 puffs by mouth every 6 hours 05/21/16  Yes Young, Clinton D, MD  albuterol (PROVENTIL) (2.5 MG/3ML) 0.083% nebulizer solution Take 3 mLs (2.5 mg total) by nebulization every 6 (six) hours  as needed for wheezing or shortness of breath. 05/21/16  Yes Young, Joni Fears D, MD  atorvastatin (LIPITOR) 40 MG tablet Take 40 mg by mouth daily.   Yes [provider]  diazepam (VALIUM) 5 MG tablet Take 1 tablet (5 mg total) by mouth 2 (two) times daily. 05/10/16  Yes Neese, Hope M, NP  fluticasone furoate-vilanterol (BREO ELLIPTA) 100-25 MCG/INH AEPB take 1 inhalation by mouth once daily AND THEN RINSE MOUTH  05/21/16  Yes Young, Clinton D, MD  glimepiride (AMARYL) 4 MG tablet Take 4 mg by mouth daily with breakfast.   Yes [provider]  lisinopril-hydrochlorothiazide (PRINZIDE,ZESTORETIC) 20-25 MG per tablet Take 1 tablet by mouth daily.   Yes [provider]  sitaGLIPtan-metformin (JANUMET) 50-1000 MG per tablet Take 1 tablet by mouth 2 (two) times daily with a meal.   Yes [provider]  diclofenac (VOLTAREN) 50 MG EC tablet Take 1 tablet (50 mg total) by mouth 2 (two) times daily. Patient not taking: Reported on 08/18/2016 05/10/16   Janne Napoleon, NP  HYDROcodone-acetaminophen (NORCO) 5-325 MG tablet Take 1 tablet by mouth every 8 (eight) hours as needed for severe pain. Patient not taking: Reported on 08/18/2016 11/20/15   Elvina Sidle, MD  meloxicam (MOBIC) 15 MG tablet Take 1 tablet (15 mg total) by mouth daily. Patient not taking: Reported on 08/18/2016 05/08/16   Rolla Plate    Family History Family History  Problem Relation Age of Onset  . Hypertension Mother   . Heart attack Father     Social History Social History  Substance Use Topics  . Smoking status: Current Some Day Smoker    Packs/day: 0.25    Years: 30.00    Types: Cigarettes  . Smokeless tobacco: Never Used     Comment: 1 pack will last him 2 weeks   . Alcohol use 0.0 oz/week     Comment: 1 pint over the weekend...its not all the time     Allergies   Hydromorphone hcl   Review of Systems Review of Systems ROS reviewed and all are negative for acute change except as noted in the HPI.  Physical Exam Updated Vital Signs BP (!) 134/92   Pulse 92   Temp 98.3 F (36.8 C)   Resp 18   SpO2 100%   Physical Exam  Constitutional: He is oriented to person, place, and time. Vital signs are normal. He appears well-developed and well-nourished.  HENT:  Head: Normocephalic and atraumatic.  Right Ear: Hearing normal.  Left Ear: Hearing normal.  Eyes: Pupils are equal, round,  and reactive to light. Conjunctivae and EOM are normal.  Neck: Normal range of motion. Neck supple.  Cardiovascular: Normal rate, regular rhythm, normal heart sounds and intact distal pulses.   Pulmonary/Chest: Effort normal and breath sounds normal.  Abdominal: Soft. Normal appearance. There is tenderness in the right upper quadrant. There is no rigidity, no rebound, no guarding, no CVA tenderness, no tenderness at McBurney's point and negative Murphy's sign.  Musculoskeletal: Normal range of motion.  Neurological: He is alert and oriented to person, place, and time.  Skin: Skin is warm and dry.  Psychiatric: He has a normal mood and affect. His speech is normal and behavior is normal. Thought content normal.  Nursing note and vitals reviewed.  ED Treatments / Results  Labs (all labs ordered are listed, but only abnormal results are displayed) Labs Reviewed  LIPASE, BLOOD - Abnormal; Notable for the following:  Result Value   Lipase 350 (*)    All other components within normal limits  COMPREHENSIVE METABOLIC PANEL - Abnormal; Notable for the following:    Glucose, Bld 161 (*)    Albumin 3.4 (*)    All other components within normal limits  CBC - Abnormal; Notable for the following:    Platelets 149 (*)    All other components within normal limits  URINALYSIS, ROUTINE W REFLEX MICROSCOPIC    EKG  EKG Interpretation None       Radiology US Abdomen Limited Ruq  Result Date: 08/18/2016 CLINICAL DATA:  Right upper quadrant pain EXAM: ULTRASOUND ABDOMEN LIMITED RIGHT UPPER QUADRANT COMPARISON:  12/23/2012 FINDINGS: Gallbladder: No gallstones or wall thickening visualized. No sonographic Murphy sign noted by sonographer. Common bile duct: Diameter: 4.5 mm Liver: Increased echogenicity of the liver compatible with hepatic steatosis. Patent portal vein with normal hepatopetal flow. No focal hepatic abnormality or intrahepatic biliary dilatation. Included views the right kidney  demonstrate no hydronephrosis. No upper abdominal free fluid. IMPRESSION: No acute finding by right upper quadrant ultrasound Electronically Signed   By: Judie Petit.  Shick M.D.   On: 08/18/2016 08:08    Procedures Procedures (including critical care time)  Medications Ordered in ED Medications  sodium chloride 0.9 % bolus 1,000 mL (not administered)  morphine 4 MG/ML injection 6 mg (not administered)  ondansetron (ZOFRAN) injection 4 mg (not administered)     Initial Impression / Assessment and Plan / ED Course  I have reviewed the triage vital signs and the nursing notes.  Pertinent labs & imaging results that were available during my care of the patient were reviewed by me and considered in my medical decision making (see chart for details).  Final Clinical Impressions(s) / ED Diagnoses  {I have reviewed and evaluated the relevant laboratory values. {I have reviewed and evaluated the relevant imaging studies.  {I have reviewed the relevant previous healthcare records.  {I obtained HPI from historian.   ED Course:  Assessment: Pt is a 56 y.o. male hx of DM, HTN, HLD, presents to the Emergency Department today due to RUQ abdominal pain x 2 days. Notes pain is constant and rates 8/10. Aching sensation. No hx same. Notes nausea without emesis. No worsening with PO intake. Pt attempted Mylanta and stool softeners with minimal relief. No CP/SOB. No fevers. No URI symptoms. Pt states he has been having regular BMs. No diarrhea. On exam, pt in NAD. Nontoxic/nonseptic appearing. VSS. Afebrile. Lungs CTA. Heart RRR. Abdomen TTP RUQ. Neg Murphys. Labs unremarkable with the exception of Lipase at 350. LFTs WNL. RUQ US unremarkable. Given analgesia and anti emetics as well as fluids in ED. Discussed with attending physician. Cause is Pancreatitis likely 2/2 ETOH intake. This is the first time this has occurred. Pt is not a regular drinker, but states he drank "a lot" x 3-4 days ago. Plan is to Admit to  medicine.   Disposition/Plan:  Admit Pt acknowledges and agrees with plan  Supervising Physician Azalia Bilis, MD  Final diagnoses:  RUQ pain  Acute pancreatitis, unspecified complication status, unspecified pancreatitis type    New Prescriptions New Prescriptions   No medications on file     Wilber Bihari 08/18/16 Libby Maw    Azalia Bilis, MD 08/18/16 2302

## 2016-08-18 NOTE — ED Triage Notes (Signed)
TC to RN on [facility]. With report on lab draws ,EKG .

## 2016-08-18 NOTE — Progress Notes (Deleted)
[facility] History and Physical  Sarp Vernier ZOX:[id number] DOB: [birth year] DOA: 08/18/2016  Referring physician:  PCP: Kaleen Mask, MD   Chief Complaint: "My stomach hurt."  HPI: Christian Bowen is a 56 y.o. male    Past medical history of diabetes, asthma, high cholesterol, hypertension, back pain, tobacco abuse, marijuana use and sleep apnea presents emergency room with chief complaint of abdominal pain. Patient states that he is a binge drinker. Drinks several mixed drinks of vodka on weekends. Last drink was last night around 11 PM. Patient had later sudden onset of epigastric abdominal pain. Patient gave that the pain times on his home which it did not. Patient alarmed came to the emergency room for evaluation.  Denies nausea, vomiting, diarrhea, fever and chills.  Denies abdominal trauma. No family history pancreatitis Patient denies history of pancreatic or gallbladder issues.  Review of Systems:  As per HPI otherwise 10 point review of systems negative.    Past Medical History:  Diagnosis Date  . Apnea, sleep    was tested 2005  . Asthma   . Diabetes mellitus   . Hyperlipemia 10/12/2013  . Hypertension   . Lumbar post-laminectomy syndrome 04/02/2013  . Obesity   . Pain in joint, shoulder region 10/12/2013  . Sleep apnea    CPAP-has had UPPP-tonsils and turb red  . Tobacco abuse 03/13/2013   Past Surgical History:  Procedure Laterality Date  . BACK SURGERY  2009  . DORSAL COMPARTMENT RELEASE Left 08/13/2012   Procedure: RELEASE DORSAL COMPARTMENT (DEQUERVAIN) and exploration of radial nerve branch;  Surgeon: Tami Ribas, MD;  Location: [facility];  Service: Orthopedics;  Laterality: Left;  . GANGLION CYST EXCISION  2/14-SCG   lt wrist   . GANGLION CYST EXCISION Left 08/13/2012   Procedure: REMOVAL GANGLION OF WRIST;  Surgeon: Tami Ribas, MD;  Location: [facility];  Service: Orthopedics;  Laterality: Left;  . heel  spurs     bilateral feel  . NASAL TURBINATE REDUCTION  8/13   septoplasty-UPPP-tonsils  . SHOULDER ARTHROSCOPY Right 07/02/2013   Procedure: RIGHT SHOULDER ARTHROSCOPY WITH EXTENSIVE DEBRIDEMENT OF ROTATOR CUFF AND LABRUM, SUBACROMIAL DECOMPRESSION, PARTIAL ACROMIOPLASTY WITH CORACOACROMIAL RELEASE;  Surgeon: Thera Flake., MD;  Location: [facility];  Service: Orthopedics;  Laterality: Right;  . SHOULDER ARTHROSCOPY WITH BICEPSTENOTOMY Right 04/15/2014   Procedure: SHOULDER ARTHROSCOPY WITH BICEPSTENOTOMY;  Surgeon: Frederico Hamman, MD;  [facility];  Service: Orthopedics;  Laterality: Right;  . SPINAL CORD STIMULATOR IMPLANT  1997  . SPINAL CORD STIMULATOR INSERTION N/A 04/02/2013   Procedure: LUMBAR SPINAL CORD STIMULATOR Insertion;  Surgeon: Gwynne Edinger, MD;  Location: [facility];  Service: Neurosurgery;  Laterality: N/A;  . TONSILLECTOMY     Social History:  reports that he has been smoking Cigarettes.  He has a 7.50 pack-year smoking history. He has never used smokeless tobacco. He reports that he drinks alcohol. He reports that he uses drugs, including Marijuana.  Allergies  Allergen Reactions  . Hydromorphone Hcl     Patient went into respiratory distress    Family History  Problem Relation Age of Onset  . Hypertension Mother   . Heart attack Father      Prior to Admission medications   Medication Sig Start Date End Date Taking? Authorizing Provider  albuterol [facility] HFA) 108 (90 Base) MCG/ACT inhaler inhale 2 puffs by mouth every 6 hours 05/21/16  Yes Waymon Budge, MD  albuterol (PROVENTIL) (2.5 MG/3ML) 0.083% nebulizer solution Take 3 mLs (2.5 mg total) by nebulization every 6 (six) hours as needed for wheezing or shortness of breath. 05/21/16  Yes Young, Joni Fearslinton D, MD  atorvastatin (LIPITOR) 40 MG tablet Take 40 mg by mouth daily.   Yes [provider]  diazepam (VALIUM) 5 MG tablet Take 1 tablet (5 mg total) by mouth 2  (two) times daily. 05/10/16  Yes Neese, Hope M, NP  fluticasone furoate-vilanterol (BREO ELLIPTA) 100-25 MCG/INH AEPB take 1 inhalation by mouth once daily AND THEN RINSE MOUTH 05/21/16  Yes Young, Clinton D, MD  glimepiride (AMARYL) 4 MG tablet Take 4 mg by mouth daily with breakfast.   Yes [provider]  lisinopril-hydrochlorothiazide (PRINZIDE,ZESTORETIC) 20-25 MG per tablet Take 1 tablet by mouth daily.   Yes [provider]  sitaGLIPtan-metformin (JANUMET) 50-1000 MG per tablet Take 1 tablet by mouth 2 (two) times daily with a meal.   Yes [provider]  diclofenac (VOLTAREN) 50 MG EC tablet Take 1 tablet (50 mg total) by mouth 2 (two) times daily. Patient not taking: Reported on 08/18/2016 05/10/16   Janne NapoleonNeese, Hope M, NP  HYDROcodone-acetaminophen (NORCO) 5-325 MG tablet Take 1 tablet by mouth every 8 (eight) hours as needed for severe pain. Patient not taking: Reported on 08/18/2016 11/20/15   Elvina SidleLauenstein, Kurt, MD  meloxicam (MOBIC) 15 MG tablet Take 1 tablet (15 mg total) by mouth daily. Patient not taking: Reported on 08/18/2016 05/08/16   Lurene ShadowPhelps, Erin O, [place]PA-C   Physical Exam: Vitals:   08/18/16 0431 08/18/16 0600 08/18/16 0630 08/18/16 0645  BP: (!) 134/92 129/79 138/86 (!) 146/102  Pulse: 92 88 86 87  Resp: 18  18   Temp: 98.3 F (36.8 C)     SpO2: 100% 94% 98% 95%    Wt Readings from Last 3 Encounters:  05/21/16 (!) 142.4 kg (314 lb)  05/10/16 (!) 139.3 kg (307 lb)  01/12/15 (!) 139.7 kg (308 lb)    General:  Appears calm and comfortable; A&Ox3; super obese Eyes:  PERRL, EOMI, normal lids, iris ENT:  grossly normal hearing, lips & tongue Neck:  no LAD, masses or thyromegaly Cardiovascular:  RRR, no m/r/g. No LE edema.  Respiratory:  CTA bilaterally, no w/r/r. Normal respiratory effort. Abdomen:  soft, ND, TTP epigastric Skin:  no rash or induration seen on limited exam Musculoskeletal:  grossly normal tone BUE/BLE Psychiatric:  grossly normal mood  and affect, speech fluent and appropriate Neurologic:  CN 2-12 grossly intact, moves all extremities in coordinated fashion.          Labs on Admission:  Basic Metabolic Panel:  Recent Labs Lab 08/18/16 0431  NA 137  K 3.9  CL 103  CO2 28  GLUCOSE 161*  BUN 10  CREATININE 0.93  CALCIUM 9.2   Liver Function Tests:  Recent Labs Lab 08/18/16 0431  AST 24  ALT 22  ALKPHOS 60  BILITOT 0.8  PROT 7.1  ALBUMIN 3.4*    Recent Labs Lab 08/18/16 0431  LIPASE 350*   No results for input(s): AMMONIA in the last 168 hours. CBC:  Recent Labs Lab 08/18/16 0431  WBC 9.2  HGB 14.1  HCT 42.7  MCV 94.1  PLT 149*   Cardiac Enzymes: No results for input(s): CKTOTAL, CKMB, CKMBINDEX, TROPONINI in the last 168 hours.  BNP (last 3 results) No results for input(s): BNP in the last 8760 hours.  ProBNP (last 3 results) No results for input(s): PROBNP  in the last 8760 hours.   Creatinine clearance cannot be calculated (Unknown ideal weight.)  CBG: No results for input(s): GLUCAP in the last 168 hours.  Radiological Exams on Admission: US Abdomen Limited Ruq  Result Date: 08/18/2016 CLINICAL DATA:  Right upper quadrant pain EXAM: ULTRASOUND ABDOMEN LIMITED RIGHT UPPER QUADRANT COMPARISON:  12/23/2012 FINDINGS: Gallbladder: No gallstones or wall thickening visualized. No sonographic Murphy sign noted by sonographer. Common bile duct: Diameter: 4.5 mm Liver: Increased echogenicity of the liver compatible with hepatic steatosis. Patent portal vein with normal hepatopetal flow. No focal hepatic abnormality or intrahepatic biliary dilatation. Included views the right kidney demonstrate no hydronephrosis. No upper abdominal free fluid. IMPRESSION: No acute finding by right upper quadrant ultrasound Electronically Signed   By: Judie Petit.  Shick M.D.   On: 08/18/2016 08:08    EKG: ordered  Assessment/Plan Principal Problem:   Pancreatitis Active Problems:   Obstructive sleep apnea    Asthma, moderate persistent   Tobacco abuse   Essential hypertension, benign   Hyperlipemia   DM (diabetes mellitus), secondary, uncontrolled, with peripheral vascular complications (HCC)   Alcohol consumption binge drinking   Pancreatitis/Epigastric Pain/Hx of sev bleeding ulcer Lipase 350 on admit Will bolus patient IVF For hydration IVF NPO except sips & meds Protonix qd CT abd/pelvis ordered RUQ [place] w/o acute findings Prn morphine for pain Prn ambien for sleep CMP in AM  OSA RT consult  Marijuana use Check UDS  Asthma Cont Breo Prn albuterol  Hypertension When necessary hydralazine 10 mg IV as needed for severe blood pressure Hold prinizide Start lisinopril tomorrow  Tobacco abuse Advised cessation Prn nicotine patch  Hyperlipidemia Hold statin, consider starting patient on fish oil when appropriate  DM SSI q4 Hold oral hyperglycemic meds  ETOH Abuse CIWA protocol  Code Status: FULL  DVT Prophylaxis: lovenox Family Communication: pt contacted his sister Disposition Plan: Pending Improvement  Status: tele, obs  Haydee Salter, MD Family Medicine [facility] [email or web address] Password [email or web address]

## 2016-08-18 NOTE — ED Triage Notes (Signed)
Pt c/o RLQ and RUQ pain x 2 days with NV, feels that it could have been something that he ate. Has tried mylanta and stool softeners without relief

## 2016-08-18 NOTE — H&P (Signed)
[facility] History and Physical  Abass Misener NWG:[id number] DOB: 11/24/1960 DOA: 08/18/2016  Referring physician:  PCP: Kaleen Mask, MD   Chief Complaint: "My stomach hurt."  HPI: Christian Bowen is a 56 y.o. male    Past medical history of diabetes, asthma, high cholesterol, hypertension, back pain, tobacco abuse, marijuana use and sleep apnea presents emergency room with chief complaint of abdominal pain. Patient states that he is a binge drinker. Drinks several mixed drinks of vodka on weekends. Last drink was last night around 11 PM. Patient had later sudden onset of epigastric abdominal pain. Patient gave that the pain times on his home which it did not. Patient alarmed came to the emergency room for evaluation.  Denies nausea, vomiting, diarrhea, fever and chills.  Denies abdominal trauma. No family history pancreatitis Patient denies history of pancreatic or gallbladder issues.  Review of Systems:  As per HPI otherwise 10 point review of systems negative.    Past Medical History:  Diagnosis Date  . Apnea, sleep    was tested 2005  . Asthma   . Diabetes mellitus   . Hyperlipemia 10/12/2013  . Hypertension   . Lumbar post-laminectomy syndrome 04/02/2013  . Obesity   . Pain in joint, shoulder region 10/12/2013  . Sleep apnea    CPAP-has had UPPP-tonsils and turb red  . Tobacco abuse 03/13/2013   Past Surgical History:  Procedure Laterality Date  . BACK SURGERY  2009  . DORSAL COMPARTMENT RELEASE Left 08/13/2012   Procedure: RELEASE DORSAL COMPARTMENT (DEQUERVAIN) and exploration of radial nerve branch;  Surgeon: Tami Ribas, MD;  Location: [facility];  Service: Orthopedics;  Laterality: Left;  . GANGLION CYST EXCISION  2/14-SCG   lt wrist   . GANGLION CYST EXCISION Left 08/13/2012   Procedure: REMOVAL GANGLION OF WRIST;  Surgeon: Tami Ribas, MD;  Location: [facility];  Service: Orthopedics;  Laterality: Left;  . heel  spurs     bilateral feel  . NASAL TURBINATE REDUCTION  8/13   septoplasty-UPPP-tonsils  . SHOULDER ARTHROSCOPY Right 07/02/2013   Procedure: RIGHT SHOULDER ARTHROSCOPY WITH EXTENSIVE DEBRIDEMENT OF ROTATOR CUFF AND LABRUM, SUBACROMIAL DECOMPRESSION, PARTIAL ACROMIOPLASTY WITH CORACOACROMIAL RELEASE;  Surgeon: Thera Flake., MD;  Location: [facility];  Service: Orthopedics;  Laterality: Right;  . SHOULDER ARTHROSCOPY WITH BICEPSTENOTOMY Right 04/15/2014   Procedure: SHOULDER ARTHROSCOPY WITH BICEPSTENOTOMY;  Surgeon: Frederico Hamman, MD;  Location: [facility];  Service: Orthopedics;  Laterality: Right;  . SPINAL CORD STIMULATOR IMPLANT  1997  . SPINAL CORD STIMULATOR INSERTION N/A 04/02/2013   Procedure: LUMBAR SPINAL CORD STIMULATOR Insertion;  Surgeon: Gwynne Edinger, MD;  Location: [facility];  Service: Neurosurgery;  Laterality: N/A;  . TONSILLECTOMY     Social History:  reports that he has been smoking Cigarettes.  He has a 7.50 pack-year smoking history. He has never used smokeless tobacco. He reports that he drinks alcohol. He reports that he uses drugs, including Marijuana.  Allergies  Allergen Reactions  . Hydromorphone Hcl     Patient went into respiratory distress    Family History  Problem Relation Age of Onset  . Hypertension Mother   . Heart attack Father      Prior to Admission medications   Medication Sig Start Date End Date Taking? Authorizing Provider  albuterol [facility] HFA) 108 (90 Base) MCG/ACT inhaler inhale 2 puffs by mouth every 6 hours 05/21/16  Yes Waymon Budge, MD  albuterol (PROVENTIL) (2.5 MG/3ML) 0.083% nebulizer solution Take 3 mLs (2.5 mg total) by nebulization every 6 (six) hours as needed for wheezing or shortness of breath. 05/21/16  Yes Young, Joni Fearslinton D, MD  atorvastatin (LIPITOR) 40 MG tablet Take 40 mg by mouth daily.   Yes [provider]  diazepam (VALIUM) 5 MG tablet Take 1 tablet (5 mg total) by mouth 2  (two) times daily. 05/10/16  Yes Neese, Hope M, NP  fluticasone furoate-vilanterol (BREO ELLIPTA) 100-25 MCG/INH AEPB take 1 inhalation by mouth once daily AND THEN RINSE MOUTH 05/21/16  Yes Young, Clinton D, MD  glimepiride (AMARYL) 4 MG tablet Take 4 mg by mouth daily with breakfast.   Yes [provider]  lisinopril-hydrochlorothiazide (PRINZIDE,ZESTORETIC) 20-25 MG per tablet Take 1 tablet by mouth daily.   Yes [provider]  sitaGLIPtan-metformin (JANUMET) 50-1000 MG per tablet Take 1 tablet by mouth 2 (two) times daily with a meal.   Yes [provider]  diclofenac (VOLTAREN) 50 MG EC tablet Take 1 tablet (50 mg total) by mouth 2 (two) times daily. Patient not taking: Reported on 08/18/2016 05/10/16   Janne NapoleonNeese, Hope M, NP  HYDROcodone-acetaminophen (NORCO) 5-325 MG tablet Take 1 tablet by mouth every 8 (eight) hours as needed for severe pain. Patient not taking: Reported on 08/18/2016 11/20/15   Elvina SidleLauenstein, Kurt, MD  meloxicam (MOBIC) 15 MG tablet Take 1 tablet (15 mg total) by mouth daily. Patient not taking: Reported on 08/18/2016 05/08/16   Lurene ShadowPhelps, Erin O, [place]-C   Physical Exam: Vitals:   08/18/16 0900 08/18/16 0915 08/18/16 0930 08/18/16 1105  BP: (!) 135/98 (!) 136/98 (!) 141/96 (!) 153/94  Pulse: 87 87 83 92  Resp:      Temp:    98.4 F (36.9 C)  SpO2: 92% 97% 96% 99%  Weight:    (!) 147.6 kg (325 lb 6.4 oz)  Height:    6' (1.829 m)    Wt Readings from Last 3 Encounters:  08/18/16 (!) 147.6 kg (325 lb 6.4 oz)  05/21/16 (!) 142.4 kg (314 lb)  05/10/16 (!) 139.3 kg (307 lb)    General:  Appears calm and comfortable; A&Ox3; super obese Eyes:  PERRL, EOMI, normal lids, iris ENT:  grossly normal hearing, lips & tongue Neck:  no LAD, masses or thyromegaly Cardiovascular:  RRR, no m/r/g. No LE edema.  Respiratory:  CTA bilaterally, no w/r/r. Normal respiratory effort. Abdomen:  soft, ND, TTP epigastric Skin:  no rash or induration seen on limited  exam Musculoskeletal:  grossly normal tone BUE/BLE Psychiatric:  grossly normal mood and affect, speech fluent and appropriate Neurologic:  CN 2-12 grossly intact, moves all extremities in coordinated fashion.          Labs on Admission:  Basic Metabolic Panel:  Recent Labs Lab 08/18/16 0431 08/18/16 1025  NA 137  --   K 3.9  --   CL 103  --   CO2 28  --   GLUCOSE 161*  --   BUN 10  --   CREATININE 0.93 0.92  CALCIUM 9.2  --    Liver Function Tests:  Recent Labs Lab 08/18/16 0431  AST 24  ALT 22  ALKPHOS 60  BILITOT 0.8  PROT 7.1  ALBUMIN 3.4*    Recent Labs Lab 08/18/16 0431  LIPASE 350*   No results for input(s): AMMONIA in the last 168 hours. CBC:  Recent Labs Lab 08/18/16 0431 08/18/16 1025  WBC 9.2 7.4  HGB 14.1  12.7*  HCT 42.7 40.1  MCV 94.1 93.9  PLT 149* 150   Cardiac Enzymes: No results for input(s): CKTOTAL, CKMB, CKMBINDEX, TROPONINI in the last 168 hours.  BNP (last 3 results) No results for input(s): BNP in the last 8760 hours.  ProBNP (last 3 results) No results for input(s): PROBNP in the last 8760 hours.   Serum creatinine: 0.92 mg/dL 16/10/96 0454 Estimated creatinine clearance: 135.5 mL/min  CBG:  Recent Labs Lab 08/18/16 1227  GLUCAP 111*    Radiological Exams on Admission: Ct Abdomen Pelvis W Contrast  Result Date: 08/18/2016 CLINICAL DATA:  Epigastric pain EXAM: CT ABDOMEN AND PELVIS WITH CONTRAST TECHNIQUE: Multidetector CT imaging of the abdomen and pelvis was performed using the standard protocol following bolus administration of intravenous contrast. CONTRAST:  ISOVUE-300 IOPAMIDOL (ISOVUE-300) INJECTION 61% COMPARISON:  12/23/2012 FINDINGS: Lower chest: Lung bases are clear. No effusions. Heart is normal size. Hepatobiliary: No focal hepatic abnormality. Gallbladder unremarkable. Pancreas: No focal abnormality or ductal dilatation. Slight haziness/stranding around the pancreatic head and uncinate process  could reflect very early acute pancreatitis. Spleen: No focal abnormality.  Normal size. Adrenals/Urinary Tract: No adrenal abnormality. No focal renal abnormality. No stones or hydronephrosis. Urinary bladder is unremarkable. Stomach/Bowel: Stomach, large and small bowel grossly unremarkable. Few scattered right colonic diverticula. Appendix is normal. Vascular/Lymphatic: No evidence of aneurysm or adenopathy. Reproductive: No visible focal abnormality. Other: No free fluid or free air. Musculoskeletal: No acute bony abnormality. IMPRESSION: Slight stranding around the pancreatic head and uncinate process, question early acute pancreatitis. Scattered right colonic diverticula.  No active diverticulitis. Electronically Signed   By: Charlett Nose M.D.   On: 08/18/2016 10:18   US Abdomen Limited Ruq  Result Date: 08/18/2016 CLINICAL DATA:  Right upper quadrant pain EXAM: ULTRASOUND ABDOMEN LIMITED RIGHT UPPER QUADRANT COMPARISON:  12/23/2012 FINDINGS: Gallbladder: No gallstones or wall thickening visualized. No sonographic Murphy sign noted by sonographer. Common bile duct: Diameter: 4.5 mm Liver: Increased echogenicity of the liver compatible with hepatic steatosis. Patent portal vein with normal hepatopetal flow. No focal hepatic abnormality or intrahepatic biliary dilatation. Included views the right kidney demonstrate no hydronephrosis. No upper abdominal free fluid. IMPRESSION: No acute finding by right upper quadrant ultrasound Electronically Signed   By: Judie Petit.  Shick M.D.   On: 08/18/2016 08:08    EKG: ordered  Assessment/Plan Principal Problem:   Pancreatitis Active Problems:   Obstructive sleep apnea   Asthma, moderate persistent   Tobacco abuse   Essential hypertension, benign   Hyperlipemia   DM (diabetes mellitus), secondary, uncontrolled, with peripheral vascular complications (HCC)   Alcohol consumption binge drinking   Pancreatitis/Epigastric Pain/Hx of sev bleeding ulcer Lipase 350  on admit Will bolus patient IVF For hydration IVF NPO except sips & meds Protonix qd CT abd/pelvis ordered RUQ [place] w/o acute findings Prn morphine for pain Prn ambien for sleep CMP in AM  OSA RT consult  Marijuana use Check UDS  Asthma Cont Breo Prn albuterol  Hypertension When necessary hydralazine 10 mg IV as needed for severe blood pressure Hold prinizide Start lisinopril tomorrow  Tobacco abuse Advised cessation Prn nicotine patch  Hyperlipidemia Hold statin, consider starting patient on fish oil when appropriate  DM SSI q4 Hold oral hyperglycemic meds  ETOH Abuse CIWA protocol  Code Status: FULL  DVT Prophylaxis: lovenox Family Communication: pt contacted his sister Disposition Plan: Pending Improvement  Status: tele, obs  Haydee Salter, MD Family Medicine [facility] [email or web address] Password [email or web address]

## 2016-08-18 NOTE — ED Triage Notes (Signed)
PT transported to CT dept °

## 2016-08-19 DIAGNOSIS — E1351 Other specified diabetes mellitus with diabetic peripheral angiopathy without gangrene: Secondary | ICD-10-CM | POA: Diagnosis not present

## 2016-08-19 DIAGNOSIS — E1365 Other specified diabetes mellitus with hyperglycemia: Secondary | ICD-10-CM

## 2016-08-19 DIAGNOSIS — K859 Acute pancreatitis without necrosis or infection, unspecified: Secondary | ICD-10-CM | POA: Diagnosis not present

## 2016-08-19 LAB — LIPID PANEL
CHOL/HDL RATIO: 3.8 ratio
Cholesterol: 135 mg/dL (ref 0–200)
HDL: 36 mg/dL — ABNORMAL LOW (ref >40)
LDL Cholesterol: 75 mg/dL (ref 0–99)
Triglycerides: 119 mg/dL (ref <150)
VLDL: 24 mg/dL (ref 0–40)

## 2016-08-19 LAB — COMPREHENSIVE METABOLIC PANEL
ALBUMIN: 3.1 g/dL — AB (ref 3.5–5.0)
ALT: 18 U/L (ref 17–63)
AST: 17 U/L (ref 15–41)
Alkaline Phosphatase: 56 U/L (ref 38–126)
Anion gap: 6 (ref 5–15)
CHLORIDE: 104 mmol/L (ref 101–111)
CO2: 31 mmol/L (ref 22–32)
CREATININE: 0.97 mg/dL (ref 0.61–1.24)
Calcium: 8.7 mg/dL — ABNORMAL LOW (ref 8.9–10.3)
GFR calc Af Amer: >60 mL/min (ref >60)
GFR calc non Af Amer: >60 mL/min (ref >60)
GLUCOSE: 171 mg/dL — AB (ref 65–99)
POTASSIUM: 4 mmol/L (ref 3.5–5.1)
Sodium: 141 mmol/L (ref 135–145)
Total Bilirubin: 0.8 mg/dL (ref 0.3–1.2)
Total Protein: 6.5 g/dL (ref 6.5–8.1)

## 2016-08-19 LAB — CBC
HEMATOCRIT: 42.2 % (ref 39.0–52.0)
Hemoglobin: 12.9 g/dL — ABNORMAL LOW (ref 13.0–17.0)
MCH: 29.6 pg (ref 26.0–34.0)
MCHC: 30.6 g/dL (ref 30.0–36.0)
MCV: 96.8 fL (ref 78.0–100.0)
Platelets: 157 10*3/uL (ref 150–400)
RBC: 4.36 MIL/uL (ref 4.22–5.81)
RDW: 13.6 % (ref 11.5–15.5)
WBC: 7.9 10*3/uL (ref 4.0–10.5)

## 2016-08-19 LAB — GLUCOSE, CAPILLARY
GLUCOSE-CAPILLARY: 108 mg/dL — AB (ref 65–99)
GLUCOSE-CAPILLARY: 128 mg/dL — AB (ref 65–99)
GLUCOSE-CAPILLARY: 87 mg/dL (ref 65–99)
Glucose-Capillary: 124 mg/dL — ABNORMAL HIGH (ref 65–99)
Glucose-Capillary: 185 mg/dL — ABNORMAL HIGH (ref 65–99)

## 2016-08-19 MED ORDER — PANTOPRAZOLE SODIUM 40 MG PO TBEC: 40.0000 mg | DELAYED_RELEASE_TABLET | Freq: Every day | ORAL | 0 refills | Status: DC

## 2016-08-19 MED ORDER — ONDANSETRON HCL 4 MG PO TABS: 4.0000 mg | ORAL_TABLET | Freq: Four times a day (QID) | ORAL | 0 refills | Status: DC | PRN

## 2016-08-19 MED ORDER — PROMETHAZINE HCL 25 MG/ML IJ SOLN
12.5000 mg | Freq: Once | INTRAMUSCULAR | Status: AC
  Administered 2016-08-19: 12.5 mg via INTRAVENOUS
  Filled 2016-08-19: qty 1

## 2016-08-19 MED ORDER — OXYCODONE-ACETAMINOPHEN 5-325 MG PO TABS: 1.0000 | ORAL_TABLET | ORAL | Status: DC | PRN

## 2016-08-19 MED ORDER — PROMETHAZINE HCL 25 MG PO TABS: 25.0000 mg | ORAL_TABLET | Freq: Four times a day (QID) | ORAL | 0 refills | Status: DC | PRN

## 2016-08-19 MED ORDER — OXYCODONE-ACETAMINOPHEN 5-325 MG PO TABS: 1.0000 | ORAL_TABLET | ORAL | 0 refills | Status: DC | PRN

## 2016-08-19 MED ORDER — PROMETHAZINE HCL 25 MG PO TABS: 25.0000 mg | ORAL_TABLET | Freq: Four times a day (QID) | ORAL | Status: DC | PRN

## 2016-08-19 NOTE — Care Management Obs Status (Signed)
MEDICARE OBSERVATION STATUS NOTIFICATION   Patient Details  Name: Christian Bowen MRN: [id number] Date of Birth: 1960/05/29   Medicare Observation Status Notification Given:  Yes    Epifanio LeschesCole, Baldomero Mirarchi Hudson, RN 08/19/2016, 5:10 PM

## 2016-08-19 NOTE — Plan of Care (Signed)
Problem: Safety: Goal: Ability to remain free from injury will improve Outcome: Progressing Pt will be free from falls and injuries during this hospitalization.  Problem: Nutritional: Goal: Ability to achieve adequate nutritional intake will improve Outcome: Progressing Pt will be free from nausea/ vomiting prior to discharge.

## 2016-08-19 NOTE — Care Management Obs Status (Signed)
MEDICARE OBSERVATION STATUS NOTIFICATION   Patient Details  Name: Christian Bowen MRN: [id number] Date of Birth: [birth year]   Medicare Observation Status Notification Given:  Yes    Epifanio LeschesCole, Prabhnoor Ellenberger Hudson, RN 08/19/2016, 5:01 PM

## 2016-08-19 NOTE — Progress Notes (Signed)
Pt's nausea is unrelieved by Zofran IV. NP on call notified. Phenergan ordered and administered. Will continue to monitor.

## 2016-08-20 LAB — HIV ANTIBODY (ROUTINE TESTING W REFLEX): HIV Screen 4th Generation wRfx: NONREACTIVE

## 2016-09-20 ENCOUNTER — Ambulatory Visit: Payer: Medicare Other | Admitting: Internal Medicine

## 2016-09-22 ENCOUNTER — Encounter: Payer: Self-pay | Admitting: Internal Medicine

## 2016-09-23 ENCOUNTER — Encounter: Payer: Self-pay | Admitting: Internal Medicine

## 2016-09-23 ENCOUNTER — Ambulatory Visit (INDEPENDENT_AMBULATORY_CARE_PROVIDER_SITE_OTHER): Payer: Medicare Other | Admitting: Internal Medicine

## 2016-09-23 VITALS — BP 126/80 | HR 88 | Ht 72.0 in | Wt 320.0 lb

## 2016-09-23 DIAGNOSIS — J4540 Moderate persistent asthma, uncomplicated: Secondary | ICD-10-CM

## 2016-09-23 DIAGNOSIS — G4733 Obstructive sleep apnea (adult) (pediatric): Secondary | ICD-10-CM | POA: Diagnosis not present

## 2016-09-23 DIAGNOSIS — Z720 Tobacco use: Secondary | ICD-10-CM | POA: Diagnosis not present

## 2016-09-23 MED ORDER — ALBUTEROL SULFATE (2.5 MG/3ML) 0.083% IN NEBU: 2.5000 mg | INHALATION_SOLUTION | Freq: Four times a day (QID) | RESPIRATORY_TRACT | 11 refills | Status: DC | PRN

## 2016-09-23 NOTE — Patient Instructions (Addendum)
Order- referral to Orthodontist Dr Althea Grimmer   Consider oral appliance for OSA  Order- referral to ENT Dr Narda Bonds - evaluate for nasal obstruction, OSA  Try using the otc Biotene mouth rinse for dry mouth  Order- [facility]  DME please provide chin strap for use with his CPAP to reduce dry mouth  Please keep trying to use your CPAP all night every night.

## 2016-09-23 NOTE — Assessment & Plan Note (Signed)
Weight today is 320 pounds which is too much. He is not making a significant weight loss effort. Bariatric referral available.

## 2016-09-23 NOTE — Assessment & Plan Note (Signed)
He needs to do better with CPAP compliance or we need to find him an alternative treatment. He complains of dry mouth. Although he has full face mask, he may be lying there with mouth open. Plan-try chinstrap with CPAP. Go back to ENT to see if anything can be done about his complaint that left nostril collapses and blocks airway to increase mouth breathing. See orthodontist to consider oral appliances alternative to CPAP.

## 2016-09-23 NOTE — Assessment & Plan Note (Signed)
No recent exacerbation. He asks refill nebulizer solution

## 2016-09-23 NOTE — Assessment & Plan Note (Signed)
He has not chosen to make an effort at smoking cessation. Support offered.

## 2016-09-23 NOTE — Progress Notes (Signed)
HPI M smoker followed for OSA, s/p UPPP, tobacco abuse, complicated by HBP, DM 2, tobacco, morbid obesity, cervical neuralgia, back pain NPSG elsewhere prior to 2005. CPAP. Subsequent weight gain 100 lbs. PFT: 05/04/13-mild to moderate obstructive airways disease, insignificant response to bronchodilator, normal diffusion, minimal restriction. FVC 3.52/78%, FEV1 2.56/72%, FEV1/FVC 0.73/91%, FEF 25-75% 1.74/51%. TLC 73%, DLCO 108%.  ----------------------------------------------------------------------------------------------------------  05/21/2016- 56 year old male smoker followed for OSA,  s/p UPPP, tobacco abuse, complicated by HBP, DM 2, tobacco, morbid obesity CPAP 16/[facility] For OSA. Last seen in 2016 for OSA. States he does not use CPAP often because it dries his mouth and nose out. Uses [facility].  Recent incidental coryza he minimizes. Has not been using CPAP regularly. Complains of dry mouth" no steam coming out of the tube".   Office Spirometry 05/21/2016 mild obstruction small airways-FVC 3.57/81%, FEV1 2.51/72%, ratio 0.70, FEF 25-75%  1.65/ 50% A pack of cigarettes last 2 or 3 weeks. He denies routine cough but has felt hoarse with his cold. Needs his inhalers refilled, old nebulizer machine replaced. CXR 05/08/16 IMPRESSION: No active cardiopulmonary disease.  09/23/16- 57 year old male smoker followed for OSA,  s/p UPPP, tobacco abuse, mild COPD ,complicated by HBP, DM 2, tobacco, morbid obesity CPAP 16/[facility] Follow up for COPD. States his breathing has been fine since last visit. Little cough or wheeze and not using rescue inhaler. He does not notice wheezing at night. At last visit we ordered new nebulizer machine, suggested Biotene and discussed CPAP compliance. Recent ER visit for acute pancreatitis Definitely sleeps better with CPAP but takes it off in the middle of the night when he gets tired of wearing it. Download 26% compliance. We discussed alternatives. He complains his  left nostril collapses when he is lying down. Previous ENT surgery with Dr. Ezzard Standing. Still bothered by dry mouth and we discussed possible use of chin strap or oral appliance.  ROS-see HPI  + = pos Constitutional:   No-   weight loss, night sweats, fevers, chills, +fatigue, lassitude. HEENT:   No-  headaches, difficulty swallowing, tooth/dental problems, sore throat,       No-  sneezing, itching, ear ache, nasal congestion, post nasal drip,  CV:  No-   chest pain, orthopnea, PND, swelling in lower extremities, anasarca,                                                          dizziness, palpitations Resp: No-   shortness of breath with exertion or at rest.              No-   productive cough,  + non-productive cough,  No- coughing up of blood.              No-   change in color of mucus.  + wheezing.   Skin: No-   rash or lesions. GI:  No-   heartburn, indigestion, abdominal pain, nausea, vomiting,  GU:  MS:  No-   joint pain or swelling.    + back pain. Neuro-     nothing unusual Psych:  No- change in mood or affect. No depression or anxiety.  No memory loss.  OBJ- Physical Exam General- Alert, Oriented, Affect-appropriate, Distress- none acute, +obese  Skin- rash-none, lesions- none, excoriation- none Lymphadenopathy- none Head- atraumatic  Eyes- Gross vision intact, PERRLA, conjunctivae and secretions clear            Ears- Hearing, canals-normal            Nose- Clear, no-Septal dev, mucus, polyps, erosion, perforation             Throat- Mallampati II/ UPPP , mucosa clear/not dry , drainage- none, tonsils- atrophic,  Neck- flexible , trachea midline, no stridor , thyroid nl, carotid no bruit Chest - symmetrical excursion , unlabored           Heart/CV- RRR , no murmur , no gallop  , no rub, nl s1 s2                           - JVD- none , edema- none, stasis changes- none, varices- none           Lung-  + trace/ unlabored, cough- none , dullness-none, rub- none            Chest wall-  Abd-  Br/ Gen/ Rectal- Not done, not indicated Extrem- cyanosis- none, clubbing, none, atrophy- none, strength- nl Neuro- grossly intact to observation

## 2016-10-03 NOTE — Discharge Summary (Addendum)
Physician Discharge Summary  Christian Bowen [id number] DOB: 1960/07/07 DOA: 08/18/2016  PCP: Kaleen MaskElkins, Wilson Oliver, MD  Admit date: 08/18/2016 Discharge date: 10/03/2016  Time spent: 45 minutes  Recommendations for Outpatient Follow-up:  1. No nsaids 2. Lose weight  Discharge Diagnoses:  Principal Problem:   Pancreatitis Active Problems:   Obstructive sleep apnea   Asthma, moderate persistent   Tobacco abuse   Essential hypertension, benign   Hyperlipemia   DM (diabetes mellitus), secondary to obesity, with peripheral vascular complications (HCC)   Alcohol consumption binge drinking   Discharge Condition: stable  Diet recommendation: low salt, heart healthy, diabetic  Filed Weights   08/18/16 1105  Weight: (!) 147.6 kg (325 lb 6.4 oz)    History of present illness:   Past medical history of diabetes, asthma, high cholesterol, hypertension, back pain, tobacco abuse, marijuana use and sleep apnea presents emergency room with chief complaint of abdominal pain. Patient states that he is a binge drinker. Drinks several mixed drinks of vodka on weekends. Last drink was last night around 11 PM. Patient had later sudden onset of epigastric abdominal pain. Patient gave that the pain times on his home which it did not. Patient alarmed came to the emergency room for evaluation.  Denies nausea, vomiting, diarrhea, fever and chills.  Denies abdominal trauma. No family history pancreatitis Patient denies history of pancreatic or gallbladder issues.  Hospital Course:  Initially made NPO. IV hydration and pain meds. RUQ neg. CT abd/pelvis showed early acute findings. Pt improved rapidly. Tolerated PO. Sx improved. Ambulated well. D/c home with return precautions.  Procedures:  none (i.e. Studies not automatically included, echos, thoracentesis, etc; not x-rays)  Consultations:  none  Discharge Exam: Vitals:   08/19/16 0941 08/19/16 1233  BP:  125/79  Pulse:  94  Resp:  18   Temp:  99 F (37.2 C)  SpO2: 92% 92%    Physical Exam  Constitutional: She is oriented to person, place, and time. She appears well-developed and well-nourished.  HENT:  Head: Normocephalic and atraumatic.  Eyes: Pupils are equal, round, and reactive to light. EOM are normal.  Cardiovascular: Normal rate and regular rhythm.   Pulmonary/Chest: Effort normal and breath sounds normal. No respiratory distress.  Abdominal: Soft. Bowel sounds are normal. There is no tenderness.  Neurological: She is alert and oriented to person, place, and time. No cranial nerve deficit.    Discharge Instructions   Discharge Instructions    Call MD for:  difficulty breathing, headache or visual disturbances    Complete by:  As directed    Call MD for:  persistant dizziness or light-headedness    Complete by:  As directed    Call MD for:  persistant nausea and vomiting    Complete by:  As directed    Call MD for:  redness, tenderness, or signs of infection (pain, swelling, redness, odor or green/yellow discharge around incision site)    Complete by:  As directed    Call MD for:  severe uncontrolled pain    Complete by:  As directed    Call MD for:  temperature >100.4    Complete by:  As directed    Diet - low sodium heart healthy    Complete by:  As directed    Discharge instructions    Complete by:  As directed    Stop drinking Walk 2 miles daily No eating after 9pm  Stay hydrated See your doctor by Friday   Increase activity slowly  Complete by:  As directed      Discharge Medication List as of 08/19/2016  5:57 PM    START taking these medications   Details  ondansetron (ZOFRAN) 4 MG tablet Take 1 tablet (4 mg total) by mouth every 6 (six) hours as needed for nausea., Starting Mon 08/19/2016, Normal    pantoprazole (PROTONIX) 40 MG tablet Take 1 tablet (40 mg total) by mouth daily., Starting Mon 08/19/2016, Normal    promethazine (PHENERGAN) 25 MG tablet Take 1 tablet (25 mg total)  by mouth every 6 (six) hours as needed for nausea or vomiting., Starting Mon 08/19/2016, Normal    oxyCODONE-acetaminophen (PERCOCET/ROXICET) 5-325 MG tablet Take 1-2 tablets by mouth every 4 (four) hours as needed for moderate pain or severe pain., Starting Mon 08/19/2016, Print      CONTINUE these medications which have NOT CHANGED   Details  albuterol (PROAIR HFA) 108 (90 Base) MCG/ACT inhaler inhale 2 puffs by mouth every 6 hours, Normal    atorvastatin (LIPITOR) 40 MG tablet Take 40 mg by mouth daily., Until Discontinued, Historical Med    diazepam (VALIUM) 5 MG tablet Take 1 tablet (5 mg total) by mouth 2 (two) times daily., Starting Fri 05/10/2016, Print    fluticasone furoate-vilanterol (BREO ELLIPTA) 100-25 MCG/INH AEPB take 1 inhalation by mouth once daily AND THEN RINSE MOUTH, Normal    glimepiride (AMARYL) 4 MG tablet Take 4 mg by mouth daily with breakfast., Historical Med    lisinopril-hydrochlorothiazide (PRINZIDE,ZESTORETIC) 20-25 MG per tablet Take 1 tablet by mouth daily., Until Discontinued, Historical Med    sitaGLIPtan-metformin (JANUMET) 50-1000 MG per tablet Take 1 tablet by mouth 2 (two) times daily with a meal., Until Discontinued, Historical Med    albuterol (PROVENTIL) (2.5 MG/3ML) 0.083% nebulizer solution Take 3 mLs (2.5 mg total) by nebulization every 6 (six) hours as needed for wheezing or shortness of breath., Starting Tue 05/21/2016, Normal      STOP taking these medications     diclofenac (VOLTAREN) 50 MG EC tablet      HYDROcodone-acetaminophen (NORCO) 5-325 MG tablet      meloxicam (MOBIC) 15 MG tablet        Allergies  Allergen Reactions  . Hydromorphone Hcl     Patient went into respiratory distress      The results of significant diagnostics from this hospitalization (including imaging, microbiology, ancillary and laboratory) are listed below for reference.    Significant Diagnostic Studies: No results found.  Microbiology: No results  found for this or any previous visit (from the past 240 hour(s)).   Labs: Basic Metabolic Panel: No results for input(s): NA, K, CL, CO2, GLUCOSE, BUN, CREATININE, CALCIUM, MG, PHOS in the last 168 hours. Liver Function Tests: No results for input(s): AST, ALT, ALKPHOS, BILITOT, PROT, ALBUMIN in the last 168 hours. No results for input(s): LIPASE, AMYLASE in the last 168 hours. No results for input(s): AMMONIA in the last 168 hours. CBC: No results for input(s): WBC, NEUTROABS, HGB, HCT, MCV, PLT in the last 168 hours. Cardiac Enzymes: No results for input(s): CKTOTAL, CKMB, CKMBINDEX, TROPONINI in the last 168 hours. BNP: BNP (last 3 results) No results for input(s): BNP in the last 8760 hours.  ProBNP (last 3 results) No results for input(s): PROBNP in the last 8760 hours.  CBG: No results for input(s): GLUCAP in the last 168 hours.   Late entry.  Signed:  Haydee Salter MD  FACP  [facility] 10/03/2016, 2:13 PM

## 2017-02-15 ENCOUNTER — Other Ambulatory Visit: Payer: Self-pay | Admitting: Family Medicine

## 2017-02-15 DIAGNOSIS — R609 Edema, unspecified: Secondary | ICD-10-CM

## 2017-03-27 ENCOUNTER — Ambulatory Visit (INDEPENDENT_AMBULATORY_CARE_PROVIDER_SITE_OTHER): Payer: Medicare Other | Admitting: Internal Medicine

## 2017-03-27 ENCOUNTER — Encounter: Payer: Self-pay | Admitting: Internal Medicine

## 2017-03-27 VITALS — BP 128/74 | HR 97 | Ht 72.0 in | Wt 331.6 lb

## 2017-03-27 DIAGNOSIS — Z720 Tobacco use: Secondary | ICD-10-CM

## 2017-03-27 DIAGNOSIS — G4733 Obstructive sleep apnea (adult) (pediatric): Secondary | ICD-10-CM

## 2017-03-27 NOTE — Progress Notes (Signed)
HPI M smoker followed for OSA, s/p UPPP, tobacco abuse, complicated by HBP, DM 2, tobacco, morbid obesity, cervical neuralgia, back pain NPSG 11/11/04 [facility]([place])- AHI 43/ hr, body weight 270 lbs CPAP. Subsequent weight gain  PFT: 05/04/13-mild to moderate obstructive airways disease, insignificant response to bronchodilator, normal diffusion, minimal restriction. FVC 3.52/78%, FEV1 2.56/72%, FEV1/FVC 0.73/91%, FEF 25-75% 1.74/51%. TLC 73%, DLCO 108%. Office Spirometry 05/21/2016 mild obstruction small airways-FVC 3.57/81%, FEV1 2.51/72%, ratio 0.70, FEF 25-75%  1.65/ 50% ----------------------------------------------------------------------------------------------------------  09/23/16- 57 year old male smoker followed for OSA,  s/p UPPP, tobacco abuse, mild COPD ,complicated by HBP, DM 2, tobacco, morbid obesity CPAP 16/[facility] Follow up for COPD. States his breathing has been fine since last visit. Little cough or wheeze and not using rescue inhaler. He does not notice wheezing at night. At last visit we ordered new nebulizer machine, suggested Biotene and discussed CPAP compliance. Recent ER visit for acute pancreatitis Definitely sleeps better with CPAP but takes it off in the middle of the night when he gets tired of wearing it. Download 26% compliance. We discussed alternatives. He complains his left nostril collapses when he is lying down. Previous ENT surgery with Dr. Ezzard StandingNewman. Still bothered by dry mouth and we discussed possible use of chin strap or oral appliance.  03/27/17- 57 year old male smoker followed for OSA,  s/p UPPP, tobacco abuse, mild COPD ,complicated by HBP, DM 2, tobacco, morbid obesity CPAP 16/[facility] ----6 month follow up for OSA. Uses [facility] as his DME. Denies any issues with his OSA or machine. Wants to discuss other mask options.  Breo 100, neb albuterol, ProAir hfa,             Note lisinopril ACEI He finds the mask and hose more annoying and wants to try a nasal valve  product he saw on TV.  He does not remember why he did not follow up with Dr. Myrtis SerKatz after being fitted for an oral appliance-possibly insurance was not covering-but he is interested in going again. He was able to tip his head back while sitting in the chair at this visit and easily duplicate loud snoring despite history of UPPP. He reports his breathing is okay with no acute respiratory infections this winter.  He is satisfied with his inhalers being used appropriately.  Still smoking a few cigarettes a day by his report against advice.  ROS-see HPI  + = pos Constitutional:   No-   weight loss, night sweats, fevers, chills, +fatigue, lassitude. HEENT:   No-  headaches, difficulty swallowing, tooth/dental problems, sore throat,       No-  sneezing, itching, ear ache, nasal congestion, post nasal drip,  CV:  No-   chest pain, orthopnea, PND, swelling in lower extremities, anasarca,                                                          dizziness, palpitations Resp: No-   shortness of breath with exertion or at rest.              No-   productive cough,  + non-productive cough,  No- coughing up of blood.              No-   change in color of mucus.  + wheezing.   Skin: No-   rash or  lesions. GI:  No-   heartburn, indigestion, abdominal pain, nausea, vomiting,  GU:  MS:  No-   joint pain or swelling.    + back pain. Neuro-     nothing unusual Psych:  No- change in mood or affect. No depression or anxiety.  No memory loss.  OBJ- Physical Exam General- Alert, Oriented, Affect-appropriate, Distress- none acute, +obese  Skin- rash-none, lesions- none, excoriation- none Lymphadenopathy- none Head- atraumatic            Eyes- Gross vision intact, PERRLA, conjunctivae and secretions clear            Ears- Hearing, canals-normal            Nose- Clear, no-Septal dev, mucus, polyps, erosion, perforation             Throat- Mallampati II/ UPPP , mucosa clear/not dry , drainage- none, tonsils-  atrophic,  Neck- flexible , trachea midline, no stridor , thyroid nl, carotid no bruit Chest - symmetrical excursion , unlabored           Heart/CV- RRR , no murmur , no gallop  , no rub, nl s1 s2                           - JVD- none , edema- none, stasis changes- none, varices- none           Lung-  + clear unlabored, cough- none , dullness-none, rub- none           Chest wall-  Abd-  Br/ Gen/ Rectal- Not done, not indicated Extrem- cyanosis- none, clubbing, none, atrophy- none, strength- nl Neuro- grossly intact to observation

## 2017-03-27 NOTE — Assessment & Plan Note (Signed)
He weighs at least 40 pounds more than he did in 2006 when he had his sleep study.  This impacts his OSA, DM 2 and other health problems.  Consider bariatric evaluation.

## 2017-03-27 NOTE — Patient Instructions (Signed)
Order- DME [facility]- please reassess and refit CPAP mask for better comfort. He is interested in smaller mask style. Continue CPAP 16, mask of choice, humidifier supplies, AirView Add chin strap if needed  Order- referral to Dr Myrtis SerKatz Orthodontist   Consider oral appliance for treating OSA

## 2017-03-27 NOTE — Assessment & Plan Note (Addendum)
We are going to ask [facility] to help him with smaller mask and headgear.  A design that comes vertically down over his head may work best, with nasal pillows instead of a full facemask. Meanwhile we are also referring back to Dr. Myrtis SerKatz for reevaluation as a possible oral appliance candidate. Willing to sacrifice some control for compliance if necessary.  The importance of meaningful weight loss was reemphasized.

## 2017-03-27 NOTE — Assessment & Plan Note (Signed)
He says he is now smoking only 1/2 pack/week.  Hopefully this is accurate.  Strongly emphasized importance of complete abstinence now.

## 2017-04-03 ENCOUNTER — Other Ambulatory Visit: Payer: Self-pay | Admitting: Internal Medicine

## 2017-05-06 ENCOUNTER — Encounter (HOSPITAL_COMMUNITY): Payer: Self-pay | Admitting: Emergency Medicine

## 2017-05-06 ENCOUNTER — Ambulatory Visit (HOSPITAL_COMMUNITY)
Admission: EM | Admit: 2017-05-06 | Discharge: 2017-05-06 | Disposition: A | Discharge status: Home / Self Care | Payer: Medicare Other | Attending: Family Medicine | Admitting: Family Medicine

## 2017-05-06 ENCOUNTER — Ambulatory Visit (INDEPENDENT_AMBULATORY_CARE_PROVIDER_SITE_OTHER): Payer: Medicare Other

## 2017-05-06 DIAGNOSIS — J4531 Mild persistent asthma with (acute) exacerbation: Secondary | ICD-10-CM

## 2017-05-06 DIAGNOSIS — J209 Acute bronchitis, unspecified: Secondary | ICD-10-CM

## 2017-05-06 MED ORDER — IPRATROPIUM-ALBUTEROL 0.5-2.5 (3) MG/3ML IN SOLN
RESPIRATORY_TRACT | Status: AC
  Filled 2017-05-06: qty 3

## 2017-05-06 MED ORDER — PREDNISONE 10 MG (21) PO TBPK: ORAL_TABLET | ORAL | 0 refills | Status: DC

## 2017-05-06 MED ORDER — IPRATROPIUM-ALBUTEROL 0.5-2.5 (3) MG/3ML IN SOLN
3.0000 mL | Freq: Once | RESPIRATORY_TRACT | Status: AC
  Administered 2017-05-06: 3 mL via RESPIRATORY_TRACT

## 2017-05-06 NOTE — ED Triage Notes (Signed)
Pt sts cough and URI sx x 1 week not improved with OTC meds; wheezing noted

## 2017-05-06 NOTE — Discharge Instructions (Addendum)
Your chest xray shows bronchitis, but it is not indicated at this situation. You are also having a mild asthma exacerbation.  Start taking your 6-day prednisone.  Continue with asthma inhalers at home.  Please follow-up with your primary care doctor if you do not get better.

## 2017-05-06 NOTE — ED Provider Notes (Signed)
[facility]    CSN: [id number] Arrival date & time: 05/06/17  [id number]     History   Chief Complaint Chief Complaint  Patient presents with  . URI    HPI Christian Bowen is a 57 y.o. male.   With history of hypertension, diabetes, asthma, sleep apnea and hyperlipidemia, presents today for two-week durations of URI symptoms consist of coughing, chest congestion, nasal congestion, shortness of breath.  Patient has tried several over-the-counter medications without any improvement.  He states that his symptoms are getting worse. He is a current smoker. He normally has no SOB with his recurrent asthma medications (Breo and ProAir).  Denies fever, chills or body aches.  Also denies chest pain, headache or abdominal pain.      Past Medical History:  Diagnosis Date  . Apnea, sleep    was tested 2005  . Asthma   . Diabetes mellitus   . Hyperlipemia 10/12/2013  . Hypertension   . Lumbar post-laminectomy syndrome 04/02/2013  . Obesity   . Pain in joint, shoulder region 10/12/2013  . Sleep apnea    CPAP-has had UPPP-tonsils and turb red  . Tobacco abuse 03/13/2013    Patient Active Problem List   Diagnosis Date Noted  . Pancreatitis 08/18/2016  . Alcohol consumption binge drinking 08/18/2016  . Essential hypertension, benign 10/12/2013  . Hyperlipemia 10/12/2013  . DM (diabetes mellitus), secondary, uncontrolled, with peripheral vascular complications (HCC) 10/12/2013  . Pain in joint, shoulder region 10/12/2013  . Lumbar post-laminectomy syndrome 04/02/2013  . Obstructive sleep apnea 03/13/2013  . Morbid obesity (HCC) 03/13/2013  . Asthma, moderate persistent 03/13/2013  . Tobacco abuse 03/13/2013    Past Surgical History:  Procedure Laterality Date  . BACK SURGERY  2009  . DORSAL COMPARTMENT RELEASE Left 08/13/2012   Procedure: RELEASE DORSAL COMPARTMENT (DEQUERVAIN) and exploration of radial nerve branch;  Surgeon: Tami RibasKevin R Kuzma, MD;  Location: [facility];  Service: Orthopedics;  Laterality: Left;  . GANGLION CYST EXCISION  2/14-SCG   lt wrist   . GANGLION CYST EXCISION Left 08/13/2012   Procedure: REMOVAL GANGLION OF WRIST;  Surgeon: Tami RibasKevin R Kuzma, MD;  Location: [facility];  Service: Orthopedics;  Laterality: Left;  . heel spurs     bilateral feel  . NASAL TURBINATE REDUCTION  8/13   septoplasty-UPPP-tonsils  . SHOULDER ARTHROSCOPY Right 07/02/2013   Procedure: RIGHT SHOULDER ARTHROSCOPY WITH EXTENSIVE DEBRIDEMENT OF ROTATOR CUFF AND LABRUM, SUBACROMIAL DECOMPRESSION, PARTIAL ACROMIOPLASTY WITH CORACOACROMIAL RELEASE;  Surgeon: Thera FlakeW D Caffrey Jr., MD;  Location: [facility];  Service: Orthopedics;  Laterality: Right;  . SHOULDER ARTHROSCOPY WITH BICEPSTENOTOMY Right 04/15/2014   Procedure: SHOULDER ARTHROSCOPY WITH BICEPSTENOTOMY;  Surgeon: Frederico Hammananiel Caffrey, MD;  Location: [facility];  Service: Orthopedics;  Laterality: Right;  . SPINAL CORD STIMULATOR IMPLANT  1997  . SPINAL CORD STIMULATOR INSERTION N/A 04/02/2013   Procedure: LUMBAR SPINAL CORD STIMULATOR Insertion;  Surgeon: Gwynne EdingerPaul C Harkins, MD;  Location: [facility];  Service: Neurosurgery;  Laterality: N/A;  . TONSILLECTOMY         Home Medications    Prior to Admission medications   Medication Sig Start Date End Date Taking? Authorizing Provider  albuterol (PROAIR HFA) 108 (90 Base) MCG/ACT inhaler inhale 2 puffs every 6 hours 04/04/17   Jetty DuhamelYoung, Clinton D, MD  albuterol (PROVENTIL) (2.5 MG/3ML) 0.083% nebulizer solution Take 3 mLs (2.5 mg total) by nebulization every 6 (six) hours as needed for wheezing or  shortness of breath. 09/23/16   Jetty Duhamel D, MD  atorvastatin (LIPITOR) 40 MG tablet Take 40 mg by mouth daily.    [provider]  BREO ELLIPTA 100-25 MCG/INH AEPB take 1 inhalation by mouth once daily then RINSE MOUTH 04/04/17   Young, Joni Fears D, MD  fluticasone furoate-vilanterol (BREO ELLIPTA) 100-25 MCG/INH AEPB take 1  inhalation by mouth once daily AND THEN RINSE MOUTH 05/21/16   Young, Clinton D, MD  glimepiride (AMARYL) 4 MG tablet Take 4 mg by mouth daily with breakfast.    [provider]  lisinopril-hydrochlorothiazide (PRINZIDE,ZESTORETIC) 20-25 MG per tablet Take 1 tablet by mouth daily.    [provider]  pantoprazole (PROTONIX) 40 MG tablet Take 1 tablet (40 mg total) by mouth daily. 08/19/16   Haydee Salter, MD  predniSONE (STERAPRED UNI-PAK 21 TAB) 10 MG (21) TBPK tablet Take as directed 05/06/17   Lucia Estelle, NP  sitaGLIPtan-metformin (JANUMET) 50-1000 MG per tablet Take 1 tablet by mouth 2 (two) times daily with a meal.    [provider]    Family History Family History  Problem Relation Age of Onset  . Hypertension Mother   . Heart attack Father     Social History Social History   Tobacco Use  . Smoking status: Current Some Day Smoker    Packs/day: 0.25    Years: 30.00    Pack years: 7.50    Types: Cigarettes  . Smokeless tobacco: Never Used  . Tobacco comment: 1 pack will last him 2 weeks   Substance Use Topics  . Alcohol use: Yes    Alcohol/week: 0.0 oz    Comment: 1 pint over the weekend...its not all the time  . Drug use: Yes    Types: Marijuana    Comment: none for 1 yr.-10/2012     Allergies   Hydromorphone hcl   Review of Systems Review of Systems  Constitutional:       See HPI     Physical Exam Triage Vital Signs ED Triage Vitals [05/06/17 1014]  Enc Vitals Group     BP 135/87     Pulse Rate 92     Resp 16     Temp (!) 97.5 F (36.4 C)     Temp Source Oral     SpO2 92 %     Weight      Height      Head Circumference      Peak Flow      Pain Score      Pain Loc      Pain Edu?      Excl. in GC?    No data found.  Updated Vital Signs BP 135/87 (BP Location: Left Arm)   Pulse 92   Temp (!) 97.5 F (36.4 C) (Oral)   Resp 16   SpO2 92%   Physical Exam  Constitutional: He is oriented to person, place, and time.  He appears well-developed and well-nourished. No distress.  HENT:  Head: Normocephalic and atraumatic.  Right Ear: External ear normal.  Left Ear: External ear normal.  Nose: Nose normal.  Mouth/Throat: Oropharynx is clear and moist. No oropharyngeal exudate.  TM pearly gray without erythema.  No sinus tenderness on percussion.  Eyes: Pupils are equal, round, and reactive to light. Conjunctivae are normal.  Neck: Normal range of motion. Neck supple.  Cardiovascular: Normal rate, regular rhythm and normal heart sounds.  No murmur heard. Pulmonary/Chest: Effort normal. He has wheezes (noted  throughout lung fields).  Abdominal: Soft. Bowel sounds are normal. There is no tenderness.  Lymphadenopathy:    He has no cervical adenopathy.  Neurological: He is alert and oriented to person, place, and time.  Skin: Skin is warm and dry. He is not diaphoretic.  Nursing note and vitals reviewed.   UC Treatments / Results  Labs (all labs ordered are listed, but only abnormal results are displayed) Labs Reviewed - No data to display  EKG None Radiology Dg Chest 2 View  Result Date: 05/06/2017 CLINICAL DATA:  Cough for 2 weeks, some shortness of breath with exertion, occasional smoking history EXAM: CHEST - 2 VIEW COMPARISON:  Chest x-ray of 05/21/2016 FINDINGS: No active infiltrate or effusion is seen. There may be mild peribronchial thickening present which can be seen with bronchitis. Mediastinal and hilar contours are unchanged and mild cardiomegaly is stable. Neurostimulator electrode extends over the midthoracic spinal canal. IMPRESSION: 1. Stable cardiomegaly. 2. Mild peribronchial thickening could indicate bronchitis. Electronically Signed   By: Dwyane Dee M.D.   On: 05/06/2017 10:41    Procedures Procedures (including critical care time)  Medications Ordered in UC Medications  ipratropium-albuterol (DUONEB) 0.5-2.5 (3) MG/3ML nebulizer solution 3 mL (3 mLs Nebulization Given 05/06/17  1038)    Initial Impression / Assessment and Plan / UC Course  I have reviewed the triage vital signs and the nursing notes.  Pertinent labs & imaging results that were available during my care of the patient were reviewed by me and considered in my medical decision making (see chart for details).  10:34: Oxygen saturation is slightly low at 92%, will obtain chest x-ray and DuoNeb treatment ordered.  11:04: Oxygen Sat improved to 95%. Patient reports feeling better.   Final Clinical Impressions(s) / UC Diagnoses   Final diagnoses:  Acute bronchitis, unspecified organism  Mild persistent asthma with acute exacerbation   Chest x-ray indicates bronchitis.  Clinical presentation concerning for asthma exacerbation. Given 6-day prednisone taper.  Please continue your routine inhalers. Please follow up with your PCP if you do not improve.   ED Discharge Orders        Ordered    predniSONE (STERAPRED UNI-PAK 21 TAB) 10 MG (21) TBPK tablet     05/06/17 1054     Controlled Substance Prescriptions [place] Controlled Substance Registry consulted? Not Applicable   Lucia Estelle, NP 05/06/17 1105

## 2017-05-11 ENCOUNTER — Other Ambulatory Visit: Payer: Self-pay | Admitting: Internal Medicine

## 2017-09-18 ENCOUNTER — Ambulatory Visit: Payer: Self-pay | Admitting: Podiatry

## 2017-09-29 ENCOUNTER — Telehealth: Payer: Self-pay | Admitting: Podiatry

## 2017-09-29 ENCOUNTER — Ambulatory Visit (INDEPENDENT_AMBULATORY_CARE_PROVIDER_SITE_OTHER): Payer: Medicare Other | Admitting: Podiatry

## 2017-09-29 ENCOUNTER — Ambulatory Visit (INDEPENDENT_AMBULATORY_CARE_PROVIDER_SITE_OTHER): Payer: Medicare Other

## 2017-09-29 DIAGNOSIS — M205X2 Other deformities of toe(s) (acquired), left foot: Secondary | ICD-10-CM | POA: Diagnosis not present

## 2017-09-29 DIAGNOSIS — M2141 Flat foot [pes planus] (acquired), right foot: Secondary | ICD-10-CM | POA: Diagnosis not present

## 2017-09-29 DIAGNOSIS — E1151 Type 2 diabetes mellitus with diabetic peripheral angiopathy without gangrene: Secondary | ICD-10-CM | POA: Diagnosis not present

## 2017-09-29 DIAGNOSIS — M779 Enthesopathy, unspecified: Secondary | ICD-10-CM

## 2017-09-29 DIAGNOSIS — M2142 Flat foot [pes planus] (acquired), left foot: Secondary | ICD-10-CM

## 2017-09-29 MED ORDER — DICLOFENAC SODIUM 1 % TD GEL: 2.0000 g | Freq: Four times a day (QID) | TRANSDERMAL | 2 refills | Status: DC

## 2017-09-29 NOTE — Telephone Encounter (Signed)
I was in this morning and told the doctor that I had some antiinflammatory medication at home and he asked me to call him back to let him know what it is. I'm calling back to let him know its gabapentin 300 mg. Take 1 capsule by mouth three times a day. My number is [phone]. If you call me back and I don't answer, please leave me a message. Thank you.

## 2017-09-29 NOTE — Telephone Encounter (Signed)
I have sent Voltaren gel to his pharmacy if you could please let him know. Also ice the area and we will work on the diabetic shoes/inserts for him. [facility] you could please make sure [facility]

## 2017-09-30 NOTE — Progress Notes (Signed)
Subjective:   Patient ID: Christian Bowen, male   DOB: 57 y.o.   MRN: [id number]   HPI 57 year old male presents the office today for concerns of pain and swelling to the right foot.  He previously had a surgery for arthritis in his big toe joint.  He states that over the last month he has had some recurrent swelling and pain to the area and he is concerned that arthritis may be coming back.  He denies any recent injury or trauma to the area.  He is also asked about diabetic shoes today.   Review of Systems  All other systems reviewed and are negative.  Past Medical History:  Diagnosis Date  . Apnea, sleep    was tested 2005  . Asthma   . Diabetes mellitus   . Hyperlipemia 10/12/2013  . Hypertension   . Lumbar post-laminectomy syndrome 04/02/2013  . Obesity   . Pain in joint, shoulder region 10/12/2013  . Sleep apnea    CPAP-has had UPPP-tonsils and turb red  . Tobacco abuse 03/13/2013    Past Surgical History:  Procedure Laterality Date  . BACK SURGERY  2009  . DORSAL COMPARTMENT RELEASE Left 08/13/2012   Procedure: RELEASE DORSAL COMPARTMENT (DEQUERVAIN) and exploration of radial nerve branch;  Surgeon: Tami RibasKevin R Kuzma, MD;  Location: [facility];  Service: Orthopedics;  Laterality: Left;  . GANGLION CYST EXCISION  2/14-SCG   lt wrist   . GANGLION CYST EXCISION Left 08/13/2012   Procedure: REMOVAL GANGLION OF WRIST;  Surgeon: Tami RibasKevin R Kuzma, MD;  Location: [facility];  Service: Orthopedics;  Laterality: Left;  . heel spurs     bilateral feel  . NASAL TURBINATE REDUCTION  8/13   septoplasty-UPPP-tonsils  . SHOULDER ARTHROSCOPY Right 07/02/2013   Procedure: RIGHT SHOULDER ARTHROSCOPY WITH EXTENSIVE DEBRIDEMENT OF ROTATOR CUFF AND LABRUM, SUBACROMIAL DECOMPRESSION, PARTIAL ACROMIOPLASTY WITH CORACOACROMIAL RELEASE;  Surgeon: Thera FlakeW D Caffrey Jr., MD;  Location: [facility];  Service: Orthopedics;  Laterality: Right;  . SHOULDER ARTHROSCOPY WITH  BICEPSTENOTOMY Right 04/15/2014   Procedure: SHOULDER ARTHROSCOPY WITH BICEPSTENOTOMY;  Surgeon: Frederico Hammananiel Caffrey, MD;  Location: [facility];  Service: Orthopedics;  Laterality: Right;  . SPINAL CORD STIMULATOR IMPLANT  1997  . SPINAL CORD STIMULATOR INSERTION N/A 04/02/2013   Procedure: LUMBAR SPINAL CORD STIMULATOR Insertion;  Surgeon: Gwynne EdingerPaul C Harkins, MD;  Location: [facility];  Service: Neurosurgery;  Laterality: N/A;  . TONSILLECTOMY       Current Outpatient Medications:  .  albuterol (PROAIR HFA) 108 (90 Base) MCG/ACT inhaler, inhale 2 puffs every 6 hours, Disp: 8.5 g, Rfl: PRN .  albuterol (PROVENTIL) (2.5 MG/3ML) 0.083% nebulizer solution, Take 3 mLs (2.5 mg total) by nebulization every 6 (six) hours as needed for wheezing or shortness of breath., Disp: 75 vial, Rfl: 11 .  atorvastatin (LIPITOR) 40 MG tablet, Take 40 mg by mouth daily., Disp: , Rfl:  .  BREO ELLIPTA 100-25 MCG/INH AEPB, TAKE 1 INHALATION BY MOUTH ONCE DAILY THEN RINSE MOUTH AFTER USE, Disp: 1 each, Rfl: 11 .  diclofenac sodium (VOLTAREN) 1 % GEL, Apply 2 g topically 4 (four) times daily. Rub into affected area of foot 2 to 4 times daily, Disp: 100 g, Rfl: 2 .  glimepiride (AMARYL) 4 MG tablet, Take 4 mg by mouth daily with breakfast., Disp: , Rfl:  .  lisinopril-hydrochlorothiazide (PRINZIDE,ZESTORETIC) 20-25 MG per tablet, Take 1 tablet by mouth daily., Disp: , Rfl:  .  pantoprazole (  PROTONIX) 40 MG tablet, Take 1 tablet (40 mg total) by mouth daily., Disp: 30 tablet, Rfl: 0 .  predniSONE (STERAPRED UNI-PAK 21 TAB) 10 MG (21) TBPK tablet, Take as directed, Disp: 21 tablet, Rfl: 0 .  sitaGLIPtan-metformin (JANUMET) 50-1000 MG per tablet, Take 1 tablet by mouth 2 (two) times daily with a meal., Disp: , Rfl:   Allergies  Allergen Reactions  . Hydromorphone Hcl     Patient went into respiratory distress       Objective:  Physical Exam  General: AAO x3, NAD  Dermatological: Scar from the prior surgery  is well-healed.  There are no open sores, no preulcerative lesions, no rash or signs of infection present.  Vascular: DP pulses 2/4, PT pulses 1/4, CRT less than 3 seconds.  Neruologic: Overall sensation intact with Dorann Ou monofilament.  Musculoskeletal: There is no pain with MPJ range of motion.  There is no significant pain on the sesamoid complex today.  The majority of tenderness although mild today on exam is on the first interspace there is minimal swelling to this area.  There is no area pinpoint tenderness or pain to vibratory sensation.  No other areas of tenderness.  Flatfoot deformity is present.  Gait: Unassisted, Nonantalgic.       Assessment:   57 year old male with mild arthritic changes first MPJ, tendinitis     Plan:  -Treatment options discussed including all alternatives, risks, and complications -Etiology of symptoms were discussed -X-rays were obtained and reviewed with the patient.  Mild arthritic changes present the first metatarsal phalangeal joint.  Hardware intact.  No evidence of acute fracture. -Discussed steroid injection he declines. -Voltaren gel ordered.   -He does state it feels better when he wears more supportive shoes.  I also think he would benefit from diabetic shoes and inserts.  Paperwork completed today for precertification of this.  I will have him follow-up with Fenton Foy or Raiford Noble for this.  [name] DPM

## 2017-09-30 NOTE — Telephone Encounter (Signed)
Called pt and left him a message that Dr. Ardelle AntonWagoner had sent Voltaren gel to his pharmacy and that Dr. Ardelle AntonWagoner also wants him to ice the area. I told the pt to make sure he has a towel or some type of protection between the ice and his skin to keep from getting a burn. I told him we have him scheduled next Wednesday, 04 September for diabetic shoe/insert measurement. Told him to call with any questions about icing or any other questions and to call us back at [phone].

## 2017-10-08 ENCOUNTER — Ambulatory Visit: Payer: Medicare Other | Admitting: Orthotics

## 2017-10-08 DIAGNOSIS — E1151 Type 2 diabetes mellitus with diabetic peripheral angiopathy without gangrene: Secondary | ICD-10-CM

## 2017-10-08 DIAGNOSIS — M779 Enthesopathy, unspecified: Secondary | ICD-10-CM

## 2017-10-08 DIAGNOSIS — M2142 Flat foot [pes planus] (acquired), left foot: Secondary | ICD-10-CM

## 2017-10-08 DIAGNOSIS — M2141 Flat foot [pes planus] (acquired), right foot: Secondary | ICD-10-CM

## 2017-10-08 DIAGNOSIS — M205X2 Other deformities of toe(s) (acquired), left foot: Secondary | ICD-10-CM

## 2017-10-09 NOTE — Progress Notes (Signed)

## 2017-10-22 ENCOUNTER — Other Ambulatory Visit: Payer: Self-pay | Admitting: Internal Medicine

## 2017-11-12 ENCOUNTER — Telehealth: Payer: Self-pay | Admitting: Podiatry

## 2017-11-12 NOTE — Telephone Encounter (Signed)
Pt left message asking about status of diabetic shoes.  I returned pts call and had to leave a message stating we have not received the paperwork approval frim Dr Jeannetta Nap yet so they have not started making the inserts. I notified pt I have hand faxed the paperwork again to Dr Jeannetta Nap and as soon as the shoes/inserts come in I would contact pt or to call me if any further questions.

## 2017-11-19 ENCOUNTER — Telehealth: Payer: Self-pay | Admitting: Internal Medicine

## 2017-11-19 DIAGNOSIS — G4733 Obstructive sleep apnea (adult) (pediatric): Secondary | ICD-10-CM

## 2017-11-19 NOTE — Telephone Encounter (Signed)
Spoke with pt, states that he saw a commercial for an implant device that can eliminate OSA and the need for a cpap.  Pt is unsure of the name of the device, or of what facility offers this procedure.  Pt requesting CY's input/recs on this.    CY please advise.  Thanks!

## 2017-11-20 NOTE — Telephone Encounter (Signed)
Called and spoke with pt letting him know the information per CY.  Pt expressed understanding and stated he would like to try the INSPIRE.  An order has been placed to refer patient to ENT, Dr. Jenne Pane.  Nothing further needed.

## 2017-11-20 NOTE — Telephone Encounter (Signed)
The technology is called INSPIRE, and is provided through [facility] by Dr Christia Reading. We can refer him to Dr Jenne Pane, ENT, to consider INSPIRE for OSA

## 2017-12-06 ENCOUNTER — Other Ambulatory Visit: Payer: Self-pay | Admitting: Internal Medicine

## 2017-12-11 ENCOUNTER — Other Ambulatory Visit: Payer: Self-pay | Admitting: Internal Medicine

## 2018-01-08 ENCOUNTER — Ambulatory Visit: Payer: Medicare Other | Admitting: Primary Care

## 2018-01-09 ENCOUNTER — Ambulatory Visit (INDEPENDENT_AMBULATORY_CARE_PROVIDER_SITE_OTHER): Payer: Medicare Other | Admitting: Primary Care

## 2018-01-09 ENCOUNTER — Encounter: Payer: Self-pay | Admitting: Primary Care

## 2018-01-09 DIAGNOSIS — G4733 Obstructive sleep apnea (adult) (pediatric): Secondary | ICD-10-CM | POA: Diagnosis not present

## 2018-01-09 MED ORDER — PROAIR HFA 108 (90 BASE) MCG/ACT IN AERS: INHALATION_SPRAY | RESPIRATORY_TRACT | 2 refills | Status: DC

## 2018-01-09 MED ORDER — FLUTICASONE FUROATE-VILANTEROL 100-25 MCG/INH IN AEPB: INHALATION_SPRAY | RESPIRATORY_TRACT | 11 refills | Status: DC

## 2018-01-09 MED ORDER — ALBUTEROL SULFATE (2.5 MG/3ML) 0.083% IN NEBU: INHALATION_SOLUTION | RESPIRATORY_TRACT | 0 refills | Status: DC

## 2018-01-09 NOTE — Assessment & Plan Note (Addendum)
-   Stable, no recent exacerbations  - Rare rescue inhaler use  - Refill Breo and pro-air/albuterol

## 2018-01-09 NOTE — Patient Instructions (Addendum)
[facility]  Refilling proair, albuterol and BREO  Follow up in 1 year with Dr. Maple HudsonYoung or as needed

## 2018-01-09 NOTE — Progress Notes (Signed)
 @Patient  ID: Christian Bowen, male    DOB: 12-02-1960, 57 y.o.   MRN: [id number]  Chief Complaint  Patient presents with  . Follow-up    Referring provider: Kaleen MaskElkins, Wilson Oliver, *  HPI: [age] year male, current some day smoker.  Past medical history significant for COPD and sleep apnea. Patient of Dr. Maple HudsonYoung, seen February 2019.  01/09/2018 Patient presents today for routine follow-up visit.  He has no acute issues, requesting refills of inhalers.  Breathing okay. Experiences some wheezing, uses rescue inhaler on average once a week at most. Denies shortness of breath and cough.  Received influenza vaccine.   Patient poorly tolerates CPAP, uses on average 3 nights a week.  States that he pulls it off and finds it very aggravating.  Patient was referred to Dr. Jenne PaneBates for possible inspire device, he is not a candidate at this time and needs to lose about 60 pounds for goal weight of 250lbs.  States that he is lost 14 pounds and continues to be motivated.  He would like to be referred to healthy wellness and weight loss.    Allergies  Allergen Reactions  . Hydromorphone Hcl     Patient went into respiratory distress    Immunization History  Administered Date(s) Administered  . Influenza Split 11/02/2014  . Influenza Whole 11/04/2012  . Influenza,inj,Quad PF,6+ Mos 10/21/2017    Past Medical History:  Diagnosis Date  . Apnea, sleep    was tested 2005  . Asthma   . Diabetes mellitus   . Hyperlipemia 10/12/2013  . Hypertension   . Lumbar post-laminectomy syndrome 04/02/2013  . Obesity   . Pain in joint, shoulder region 10/12/2013  . Sleep apnea    CPAP-has had UPPP-tonsils and turb red  . Tobacco abuse 03/13/2013    Tobacco History: Social History   Tobacco Use  Smoking Status Current Some Day Smoker  . Packs/day: 0.25  . Years: 30.00  . Pack years: 7.50  . Types: Cigarettes  Smokeless Tobacco Never Used  Tobacco Comment   1 pack will last him 2 weeks    Ready to quit:  Not Answered Counseling given: Not Answered Comment: 1 pack will last him 2 weeks    Outpatient Medications Prior to Visit  Medication Sig Dispense Refill  . atorvastatin (LIPITOR) 40 MG tablet Take 40 mg by mouth daily.    . diclofenac sodium (VOLTAREN) 1 % GEL Apply 2 g topically 4 (four) times daily. Rub into affected area of foot 2 to 4 times daily 100 g 2  . glimepiride (AMARYL) 4 MG tablet Take 4 mg by mouth daily with breakfast.    . lisinopril-hydrochlorothiazide (PRINZIDE,ZESTORETIC) 20-25 MG per tablet Take 1 tablet by mouth daily.    . pantoprazole (PROTONIX) 40 MG tablet Take 1 tablet (40 mg total) by mouth daily. 30 tablet 0  . sitaGLIPtan-metformin (JANUMET) 50-1000 MG per tablet Take 1 tablet by mouth 2 (two) times daily with a meal.    . albuterol (PROVENTIL) (2.5 MG/3ML) 0.083% nebulizer solution INHALE CONTENTS OF 1 VIAL IN NEBULIZER EVERY 6 HOURS IF NEEDED FOR WHEEZING OR SHORTNESS OF BREATH 75 mL 0  . BREO ELLIPTA 100-25 MCG/INH AEPB TAKE 1 INHALATION BY MOUTH ONCE DAILY THEN RINSE MOUTH AFTER USE 1 each 11  . predniSONE (STERAPRED UNI-PAK 21 TAB) 10 MG (21) TBPK tablet Take as directed 21 tablet 0  . PROAIR HFA 108 (90 Base) MCG/ACT inhaler INHALE 2 PUFFS EVERY 6 HOURS 8.5 g 0  No facility-administered medications prior to visit.     Review of Systems  Review of Systems  Constitutional: Negative.   HENT: Negative.   Respiratory: Negative.   Psychiatric/Behavioral: Negative.     Physical Exam  BP 110/72 (BP Location: Right Arm, Cuff Size: Normal)   Pulse 91   Temp 98.4 F (36.9 C)   Ht 6' (1.829 m)   Wt (!) 328 lb (148.8 kg)   SpO2 97%   BMI 44.48 kg/m  Physical Exam  Constitutional: He is oriented to person, place, and time. He appears well-developed and well-nourished.  Overweight  HENT:  Head: Normocephalic and atraumatic.  Eyes: Pupils are equal, round, and reactive to light. EOM are normal.  Neck: Normal range of motion. Neck supple.    Cardiovascular: Normal rate, regular rhythm, normal heart sounds and intact distal pulses.  Pulmonary/Chest: Effort normal and breath sounds normal. No respiratory distress. He has no wheezes.  Abdominal: Soft. Bowel sounds are normal. There is no tenderness.  Neurological: He is alert and oriented to person, place, and time.  Skin: Skin is warm and dry. No rash noted. No erythema.  Psychiatric: He has a normal mood and affect. His behavior is normal. Judgment normal.     Lab Results:  CBC    Component Value Date/Time   WBC 7.9 08/19/2016 0503   RBC 4.36 08/19/2016 0503   HGB 12.9 (L) 08/19/2016 0503   HCT 42.2 08/19/2016 0503   PLT 157 08/19/2016 0503   MCV 96.8 08/19/2016 0503   MCH 29.6 08/19/2016 0503   MCHC 30.6 08/19/2016 0503   RDW 13.6 08/19/2016 0503    BMET    Component Value Date/Time   NA 141 08/19/2016 0503   K 4.0 08/19/2016 0503   CL 104 08/19/2016 0503   CO2 31 08/19/2016 0503   GLUCOSE 171 (H) 08/19/2016 0503   BUN <5 (L) 08/19/2016 0503   CREATININE 0.97 08/19/2016 0503   CALCIUM 8.7 (L) 08/19/2016 0503   GFRNONAA >60 08/19/2016 0503   GFRAA >60 08/19/2016 0503    BNP No results found for: BNP  ProBNP No results found for: PROBNP  Imaging: No results found.   Assessment & Plan:   Obstructive sleep apnea - Difficulty tolerating CPAP, wears 3 nights a week on average - Patient saw Dr. Jenne Pane for consideration for inspire device, he needs to lose weight (goal 250lbs). Patient is motivated, will refer to healthy weight and wellness center  Asthma, moderate persistent - Stable, no recent exacerbations  - Rare rescue inhaler use  - Refill Breo and pro-air/albuterol    Glenford Bayley, NP 01/09/2018

## 2018-01-09 NOTE — Assessment & Plan Note (Signed)
-   Difficulty tolerating CPAP, wears 3 nights a week on average - Patient saw Dr. Jenne PaneBates for consideration for inspire device, he needs to lose weight (goal 250lbs). Patient is motivated, will refer to healthy weight and wellness center

## 2018-01-15 ENCOUNTER — Ambulatory Visit (INDEPENDENT_AMBULATORY_CARE_PROVIDER_SITE_OTHER): Payer: Medicare Other | Admitting: Orthotics

## 2018-01-15 DIAGNOSIS — E1151 Type 2 diabetes mellitus with diabetic peripheral angiopathy without gangrene: Secondary | ICD-10-CM

## 2018-01-15 DIAGNOSIS — M7751 Other enthesopathy of right foot: Secondary | ICD-10-CM | POA: Diagnosis not present

## 2018-01-15 DIAGNOSIS — M2141 Flat foot [pes planus] (acquired), right foot: Secondary | ICD-10-CM | POA: Diagnosis not present

## 2018-01-15 DIAGNOSIS — M779 Enthesopathy, unspecified: Secondary | ICD-10-CM

## 2018-01-15 DIAGNOSIS — M2142 Flat foot [pes planus] (acquired), left foot: Secondary | ICD-10-CM | POA: Diagnosis not present

## 2018-01-15 DIAGNOSIS — M205X2 Other deformities of toe(s) (acquired), left foot: Secondary | ICD-10-CM

## 2018-01-15 NOTE — Progress Notes (Signed)

## 2018-02-18 ENCOUNTER — Encounter (HOSPITAL_COMMUNITY): Payer: Self-pay | Admitting: Emergency Medicine

## 2018-02-18 ENCOUNTER — Other Ambulatory Visit: Payer: Self-pay

## 2018-02-18 ENCOUNTER — Ambulatory Visit (HOSPITAL_COMMUNITY)
Admission: EM | Admit: 2018-02-18 | Discharge: 2018-02-18 | Disposition: A | Discharge status: Home / Self Care | Payer: Medicare Other | Attending: Family Medicine | Admitting: Family Medicine

## 2018-02-18 DIAGNOSIS — M7711 Lateral epicondylitis, right elbow: Secondary | ICD-10-CM | POA: Diagnosis not present

## 2018-02-18 MED ORDER — PREDNISONE 10 MG (21) PO TBPK: ORAL_TABLET | Freq: Every day | ORAL | 0 refills | Status: DC

## 2018-02-18 NOTE — ED Provider Notes (Signed)
[facility]   [id number] 02/18/18 Arrival Time: 1106  ASSESSMENT & PLAN:  1. Lateral epicondylitis of right elbow    Trial of: Meds ordered this encounter  Medications  . predniSONE (STERAPRED UNI-PAK 21 TAB) 10 MG (21) TBPK tablet    Sig: Take by mouth daily. Take as directed.    Dispense:  21 tablet    Refill:  0   Tennis elbow brace applied. To use for the next 1-2 weeks. Continue ibuprofen with food.  Follow-up Information    Kaleen MaskElkins, Wilson Oliver, MD.   Specialty:  Family Medicine Why:  If symptoms worsen or if you are not improving over the next week. Contact information: [address] [place] [id number] [phone]        [facility].   Specialty:  Urgent Care Why:  As needed. Contact information: [address] [place] [id number] [phone]         Reviewed expectations re: course of current medical issues. Questions answered. Outlined signs and symptoms indicating need for more acute intervention. Patient verbalized understanding. After Visit Summary given.  SUBJECTIVE: History from: patient. Christian GreekLuther Bowen is a 58 y.o. male who reports fairly persistent mild to moderate pain of his right elbow; described as aching; occasionally radiates down his forearm. Onset: gradual, first noticed 3-4 months ago. Injury/trama: none reported. Symptoms have progressed to a point and plateaued since beginning. Aggravating factors: movement. Alleviating factors: rest. Associated symptoms: none reported. Extremity sensation changes or weakness: none. Self treatment: occasional ibuprofen without much help. History of similar: no.  Past Surgical History:  Procedure Laterality Date  . BACK SURGERY  2009  . DORSAL COMPARTMENT RELEASE Left 08/13/2012   Procedure: RELEASE DORSAL COMPARTMENT (DEQUERVAIN) and exploration of radial nerve branch;  Surgeon: Tami RibasKevin R Kuzma, MD;  Location: [facility];  Service: Orthopedics;  Laterality: Left;  . GANGLION CYST EXCISION  2/14-SCG   lt wrist   . GANGLION CYST EXCISION Left 08/13/2012   Procedure: REMOVAL GANGLION OF WRIST;  Surgeon: Tami RibasKevin R Kuzma, MD;  Location: [facility];  Service: Orthopedics;  Laterality: Left;  . heel spurs     bilateral feel  . NASAL TURBINATE REDUCTION  8/13   septoplasty-UPPP-tonsils  . SHOULDER ARTHROSCOPY Right 07/02/2013   Procedure: RIGHT SHOULDER ARTHROSCOPY WITH EXTENSIVE DEBRIDEMENT OF ROTATOR CUFF AND LABRUM, SUBACROMIAL DECOMPRESSION, PARTIAL ACROMIOPLASTY WITH CORACOACROMIAL RELEASE;  Surgeon: Thera FlakeW D Caffrey Jr., MD;  Location: [facility];  Service: Orthopedics;  Laterality: Right;  . SHOULDER ARTHROSCOPY WITH BICEPSTENOTOMY Right 04/15/2014   Procedure: SHOULDER ARTHROSCOPY WITH BICEPSTENOTOMY;  Surgeon: Frederico Hammananiel Caffrey, MD;  Location: [facility];  Service: Orthopedics;  Laterality: Right;  . SPINAL CORD STIMULATOR IMPLANT  1997  . SPINAL CORD STIMULATOR INSERTION N/A 04/02/2013   Procedure: LUMBAR SPINAL CORD STIMULATOR Insertion;  Surgeon: Gwynne EdingerPaul C Harkins, MD;  [facility];  Service: Neurosurgery;  Laterality: N/A;  . TONSILLECTOMY       ROS: As per HPI. All other systems negative    OBJECTIVE:  Vitals:   02/18/18 1144  BP: (!) 141/87  Pulse: 95  Temp: 97.9 F (36.6 C)  TempSrc: Oral  SpO2: 98%    General appearance: alert; no distress Extremities: . RUE: warm and well perfused; fairly well localized mild to moderate tenderness over right lateral epicondyle; without gross deformities; with no swelling; with no bruising; ROM: normal CV: brisk extremity capillary refill of  RUE; 2+ radial pulse of RUE. Skin: warm and dry; no visible rashes Neurologic: gait normal; normal reflexes of RUE and LUE; normal sensation of RUE and LUE; normal strength of RUE and LUE Psychological: alert and cooperative; normal mood and affect  Allergies    Allergen Reactions  . Hydromorphone Hcl     Patient went into respiratory distress    Past Medical History:  Diagnosis Date  . Apnea, sleep    was tested 2005  . Asthma   . Diabetes mellitus   . Hyperlipemia 10/12/2013  . Hypertension   . Lumbar post-laminectomy syndrome 04/02/2013  . Obesity   . Pain in joint, shoulder region 10/12/2013  . Sleep apnea    CPAP-has had UPPP-tonsils and turb red  . Tobacco abuse 03/13/2013   Social History   Socioeconomic History  . Marital status: Single    Spouse name: Not on file  . Number of children: Not on file  . Years of education: Not on file  . Highest education level: Not on file  Occupational History  . Occupation: Disabled  Social Needs  . Financial resource strain: Not on file  . Food insecurity:    Worry: Not on file    Inability: Not on file  . Transportation needs:    Medical: Not on file    Non-medical: Not on file  Tobacco Use  . Smoking status: Current Some Day Smoker    Packs/day: 0.25    Years: 30.00    Pack years: 7.50    Types: Cigarettes  . Smokeless tobacco: Never Used  . Tobacco comment: 1 pack will last him 2 weeks   Substance and Sexual Activity  . Alcohol use: Yes    Alcohol/week: 0.0 standard drinks    Comment: 1 pint over the weekend...its not all the time  . Drug use: Yes    Types: Marijuana    Comment: none for 1 yr.-10/2012  . Sexual activity: Not on file    Comment: occ-1/2 pk week  Lifestyle  . Physical activity:    Days per week: Not on file    Minutes per session: Not on file  . Stress: Not on file  Relationships  . Social connections:    Talks on phone: Not on file    Gets together: Not on file    Attends religious service: Not on file    Active member of club or organization: Not on file    Attends meetings of clubs or organizations: Not on file    Relationship status: Not on file  Other Topics Concern  . Not on file  Social History Narrative   Lives alone.    Disabled   Family  History  Problem Relation Age of Onset  . Hypertension Mother   . Heart attack Father    Past Surgical History:  Procedure Laterality Date  . BACK SURGERY  2009  . DORSAL COMPARTMENT RELEASE Left 08/13/2012   Procedure: RELEASE DORSAL COMPARTMENT (DEQUERVAIN) and exploration of radial nerve branch;  Surgeon: Tami Ribas, MD;  Location: [facility];  Service: Orthopedics;  Laterality: Left;  . GANGLION CYST EXCISION  2/14-SCG   lt wrist   . GANGLION CYST EXCISION Left 08/13/2012   Procedure: REMOVAL GANGLION OF WRIST;  Surgeon: Tami Ribas, MD;  Location: [facility];  Service: Orthopedics;  Laterality: Left;  . heel spurs     bilateral feel  . NASAL TURBINATE REDUCTION  8/13   septoplasty-UPPP-tonsils  .  SHOULDER ARTHROSCOPY Right 07/02/2013   Procedure: RIGHT SHOULDER ARTHROSCOPY WITH EXTENSIVE DEBRIDEMENT OF ROTATOR CUFF AND LABRUM, SUBACROMIAL DECOMPRESSION, PARTIAL ACROMIOPLASTY WITH CORACOACROMIAL RELEASE;  Surgeon: Thera FlakeW D Caffrey Jr., MD;  Location: [facility];  Service: Orthopedics;  Laterality: Right;  . SHOULDER ARTHROSCOPY WITH BICEPSTENOTOMY Right 04/15/2014   Procedure: SHOULDER ARTHROSCOPY WITH BICEPSTENOTOMY;  Surgeon: Frederico Hammananiel Caffrey, MD;  Location:  [facility];  Service: Orthopedics;  Laterality: Right;  . SPINAL CORD STIMULATOR IMPLANT  1997  . SPINAL CORD STIMULATOR INSERTION N/A 04/02/2013   Procedure: LUMBAR SPINAL CORD STIMULATOR Insertion;  Surgeon: Gwynne EdingerPaul C Harkins, MD;  [facility];  Service: Neurosurgery;  Laterality: N/A;  . Greig RightNSILLECTOMY        Bekah Igoe, MD 02/21/18 [phone]

## 2018-02-18 NOTE — ED Triage Notes (Signed)
Pt reports right elbow pain not related to an injury and he states he gets a sharp pain that runs down his forearm at night causing him to lose sleep.  Pt has been taking 800 mg Ibuprofen with only a little relief.

## 2018-02-18 NOTE — Discharge Instructions (Addendum)
Be aware: Prednisone will cause a temporary rise of your blood sugar.

## 2018-03-27 ENCOUNTER — Ambulatory Visit: Payer: Medicare Other | Admitting: Internal Medicine

## 2018-08-26 ENCOUNTER — Telehealth: Payer: Self-pay | Admitting: Internal Medicine

## 2018-08-26 NOTE — Telephone Encounter (Signed)
Will keep an eye out for the form. Routing to both [place] as an FYI that a form is being faxed back over for CY to refill out.

## 2018-08-27 NOTE — Telephone Encounter (Signed)
Received form from [place] regarding the addendum requested mentioned in previous phone notes. CY has reviewed and completed these forms. I am placing these forms to be faxed back to the number provided within paperwork. Nothing further needed at this time.

## 2018-09-16 ENCOUNTER — Telehealth: Payer: Self-pay | Admitting: Internal Medicine

## 2018-09-16 NOTE — Telephone Encounter (Signed)
PCCs, can you help with this? Thank you.  

## 2018-09-16 NOTE — Telephone Encounter (Signed)
Returned call to patient.  Patient states he is looking at purchasing a device to use from [facility] to use instead of cpap mask.  States he stays at his girlfriends house and different places and is tired of carrying the cpap around. He thinks he needs a less bulky option.  Offered appointment with Dr. Annamaria Boots to discuss. Patient wants appt as soon as possible.  Scheduled for 09/17/18 at 1130.  Nothing further needed.

## 2018-09-16 NOTE — Telephone Encounter (Signed)
Left message for pt to gather additional information.

## 2018-09-16 NOTE — Telephone Encounter (Signed)
Pt returned missed call and would like a call back 

## 2018-09-16 NOTE — Telephone Encounter (Signed)
I do not see an order for a sleep study to schedule so this probably needs to go to Dr Annamaria Boots

## 2018-09-17 ENCOUNTER — Encounter: Payer: Self-pay | Admitting: Internal Medicine

## 2018-09-17 ENCOUNTER — Other Ambulatory Visit: Payer: Self-pay

## 2018-09-17 ENCOUNTER — Ambulatory Visit (INDEPENDENT_AMBULATORY_CARE_PROVIDER_SITE_OTHER): Payer: Medicare Other | Admitting: Internal Medicine

## 2018-09-17 VITALS — BP 124/82 | HR 104 | Temp 98.3°F | Ht 73.0 in | Wt 302.2 lb

## 2018-09-17 DIAGNOSIS — G4733 Obstructive sleep apnea (adult) (pediatric): Secondary | ICD-10-CM | POA: Diagnosis not present

## 2018-09-17 DIAGNOSIS — Z720 Tobacco use: Secondary | ICD-10-CM

## 2018-09-17 MED ORDER — ALBUTEROL SULFATE (2.5 MG/3ML) 0.083% IN NEBU: INHALATION_SOLUTION | RESPIRATORY_TRACT | 12 refills | Status: DC

## 2018-09-17 MED ORDER — BREO ELLIPTA 100-25 MCG/INH IN AEPB: INHALATION_SPRAY | RESPIRATORY_TRACT | 11 refills | Status: DC

## 2018-09-17 MED ORDER — PROAIR HFA 108 (90 BASE) MCG/ACT IN AERS: INHALATION_SPRAY | RESPIRATORY_TRACT | 12 refills | Status: DC

## 2018-09-17 NOTE — Patient Instructions (Addendum)
Order- schedule unattended home sleep test or Split night study   Dx OSA  Please call us about 2 weeks after your sleep test to see if results and recommendations are ready yet. If appropriate, we may be able to get you referred to see about an oral appliance before we see you next.   Breathing medicine refills sent   We will make note of your new primary physician

## 2018-09-17 NOTE — Progress Notes (Signed)
HPI M smoker followed for OSA, s/p UPPP, tobacco abuse, complicated by HBP, DM 2, tobacco, morbid obesity, cervical neuralgia, back pain NPSG 11/11/04 [facility]([place])- AHI 43/ hr, body weight 270 lbs CPAP. Subsequent weight gain  PFT: 05/04/13-mild to moderate obstructive airways disease, insignificant response to bronchodilator, normal diffusion, minimal restriction. FVC 3.52/78%, FEV1 2.56/72%, FEV1/FVC 0.73/91%, FEF 25-75% 1.74/51%. TLC 73%, DLCO 108%. Office Spirometry 05/21/2016 mild obstruction small airways-FVC 3.57/81%, FEV1 2.51/72%, ratio 0.70, FEF 25-75%  1.65/ 50% ----------------------------------------------------------------------------------------------------------   03/27/17- 58 year old male smoker followed for OSA,  s/p UPPP, tobacco abuse, mild COPD ,complicated by HBP, DM 2, tobacco, morbid obesity CPAP 16/[facility] ----6 month follow up for OSA. Uses [facility] as his DME. Denies any issues with his OSA or machine. Wants to discuss other mask options.  Breo 100, neb albuterol, ProAir hfa,             Note lisinopril ACEI He finds the mask and hose more annoying and wants to try a nasal valve product he saw on TV.  He does not remember why he did not follow up with Dr. Myrtis SerKatz after being fitted for an oral appliance-possibly insurance was not covering-but he is interested in going again. He was able to tip his head back while sitting in the chair at this visit and easily duplicate loud snoring despite history of UPPP. He reports his breathing is okay with no acute respiratory infections this winter.  He is satisfied with his inhalers being used appropriately.  Still smoking a few cigarettes a day by his report against advice.  09/17/2018-  58 year old male smoker followed for OSA,  s/p UPPP, tobacco abuse, mild COPD ,complicated by HBP, DM 2, tobacco, morbid obesity CPAP 16/[facility]   No Download available- poorly compliant -----OSA on CPAP, inquiring about oral appliance, states he will  need another sleep study  Body weight today 302 lbs   Smokes 1 pack/ 2 weeks He has looked on-line at oral appliances, but agrees to let us refer to someone who can discuss this option in person.  Has lost 34 lbs on his own- strongly encouraged to continue this effort.  Still smoking- encouraged to quit  ROS-see HPI  + = positive Constitutional:   No-   weight loss, night sweats, fevers, chills, +fatigue, lassitude. HEENT:   No-  headaches, difficulty swallowing, tooth/dental problems, sore throat,       No-  sneezing, itching, ear ache, nasal congestion, post nasal drip,  CV:  No-   chest pain, orthopnea, PND, swelling in lower extremities, anasarca,                                                          dizziness, palpitations Resp: No-   shortness of breath with exertion or at rest.              No-   productive cough,  + non-productive cough,  No- coughing up of blood.              No-   change in color of mucus.  + wheezing.   Skin: No-   rash or lesions. GI:  No-   heartburn, indigestion, abdominal pain, nausea, vomiting,  GU:  MS:  No-   joint pain or swelling.    + back pain. Neuro-  nothing unusual Psych:  No- change in mood or affect. No depression or anxiety.  No memory loss.  OBJ- Physical Exam General- Alert, Oriented, Affect-appropriate, Distress- none acute, +obese  Skin- rash-none, lesions- none, excoriation- none Lymphadenopathy- none Head- atraumatic            Eyes- Gross vision intact, PERRLA, conjunctivae and secretions clear            Ears- Hearing, canals-normal            Nose- Clear, no-Septal dev, mucus, polyps, erosion, perforation             Throat- Mallampati II/ UPPP , mucosa clear/not dry , drainage- none, tonsils- atrophic,  Neck- flexible , trachea midline, no stridor , thyroid nl, carotid no bruit Chest - symmetrical excursion , unlabored           Heart/CV- RRR , no murmur , no gallop  , no rub, nl s1 s2                           - JVD- none  , edema- none, stasis changes- none, varices- none           Lung-  + clear unlabored, cough+  , dullness-none, rub- none           Chest wall-  Abd-  Br/ Gen/ Rectal- Not done, not indicated Extrem- cyanosis- none, clubbing, none, atrophy- none, strength- nl Neuro- grossly intact to observation

## 2018-09-19 NOTE — Assessment & Plan Note (Signed)
Important that he make a personal decision to stop- Discussed, support available

## 2018-09-19 NOTE — Assessment & Plan Note (Signed)
He hasn't been able to get comfortable with CPAP. Appropriate discussion, motivation efforts, education. Plan- update database with new sleep study following weight loss. Then anticipate referral to Dr Ron Parker or discussion of oral appliance option for treatment.

## 2018-09-23 ENCOUNTER — Telehealth: Payer: Self-pay | Admitting: Internal Medicine

## 2018-09-23 NOTE — Telephone Encounter (Signed)
Sched w/ Rodena Piety 8/20.

## 2018-09-24 ENCOUNTER — Ambulatory Visit: Payer: Medicare Other

## 2018-09-24 ENCOUNTER — Other Ambulatory Visit: Payer: Self-pay

## 2018-09-24 DIAGNOSIS — G4733 Obstructive sleep apnea (adult) (pediatric): Secondary | ICD-10-CM

## 2018-09-25 DIAGNOSIS — G4733 Obstructive sleep apnea (adult) (pediatric): Secondary | ICD-10-CM | POA: Diagnosis not present

## 2018-10-02 ENCOUNTER — Telehealth: Payer: Self-pay | Admitting: Internal Medicine

## 2018-10-02 DIAGNOSIS — G4733 Obstructive sleep apnea (adult) (pediatric): Secondary | ICD-10-CM

## 2018-10-05 NOTE — Telephone Encounter (Signed)
Pt is requesting results from his sleep study.  CY - please advise. Thanks.

## 2018-10-06 NOTE — Telephone Encounter (Signed)
Called and spoke with patient regarding CY's results and recommendation. Pt verbalized understanding and agreed to these measures; however, he states he would really prefer to get rid of the CPAP machine. Pt states he recently had to "turn down the settings" because he had trouble breathing. He also says he "doesn't want to carry the machine around" and have to sleep with it when he's around his significant other. He states it is "embarrassing" to him and he initially wanted to try the oral device route. I reinforced to pt that oral appliances are typically indicated for mild obstructive sleep apnea cases and that, according to his recent home sleep test result, he has obstructive sleep apnea; therefore, he will be needing CPAP, perhaps even BiPAP depending on what he titration study finds. Pt expressed understanding of the process; however, he continues to verbalize frustration for having to use a CPAP machine. I let him know I would place the order for the CPAP to BiPAP titration study and send his concerns to CY to see if he has any additional recommendations. Pt verbalized understanding.   Order for CPAP to BiPAP titration has been placed. CY, please advise if you have any further recommendations for pt based on his concerns regarding CPAP machines. Thank you.

## 2018-10-06 NOTE — Telephone Encounter (Signed)
Home sleep test showed severe obstructive sleep apnea with low oxygen levels. I strongly recommend we order CPAP to BIPAP titration sleep study with supplemental oxygen added as needed

## 2018-10-07 NOTE — Telephone Encounter (Signed)
If patient is willing to go ahead with the titration sleep study, I suggest we do that.  Meanwhile I suggest we refer to Dr Ron Parker, Orthodontics, to learn about oral appliances for sleep apnea.

## 2018-10-08 NOTE — Telephone Encounter (Signed)
Contacted patient by phone regarding Dr. Janee Morn follow up recommendations.  Patient will proceed with titration study and also would like referral placed to dentistry for oral appliance evaluation.  Order placed and nothing further needed.

## 2018-10-14 ENCOUNTER — Other Ambulatory Visit (HOSPITAL_COMMUNITY)
Admission: RE | Admit: 2018-10-14 | Discharge: 2018-10-14 | Disposition: A | Discharge status: Home / Self Care | Payer: Medicare Other | Source: Ambulatory Visit | Attending: Internal Medicine | Admitting: Internal Medicine

## 2018-10-14 DIAGNOSIS — Z01812 Encounter for preprocedural laboratory examination: Secondary | ICD-10-CM | POA: Insufficient documentation

## 2018-10-14 DIAGNOSIS — Z20828 Contact with and (suspected) exposure to other viral communicable diseases: Secondary | ICD-10-CM | POA: Diagnosis not present

## 2018-10-14 LAB — SARS CORONAVIRUS 2 (TAT 6-24 HRS): SARS Coronavirus 2: NEGATIVE

## 2018-10-16 ENCOUNTER — Other Ambulatory Visit: Payer: Self-pay

## 2018-10-16 ENCOUNTER — Encounter (HOSPITAL_BASED_OUTPATIENT_CLINIC_OR_DEPARTMENT_OTHER): Payer: Medicare Other | Admitting: Internal Medicine

## 2018-10-16 ENCOUNTER — Ambulatory Visit (HOSPITAL_BASED_OUTPATIENT_CLINIC_OR_DEPARTMENT_OTHER): Payer: Medicare Other | Attending: Internal Medicine | Admitting: Internal Medicine

## 2018-10-16 VITALS — Ht 72.0 in | Wt 300.0 lb

## 2018-10-16 DIAGNOSIS — G4733 Obstructive sleep apnea (adult) (pediatric): Secondary | ICD-10-CM | POA: Insufficient documentation

## 2018-10-21 ENCOUNTER — Telehealth: Payer: Self-pay | Admitting: Internal Medicine

## 2018-10-21 DIAGNOSIS — G4733 Obstructive sleep apnea (adult) (pediatric): Secondary | ICD-10-CM

## 2018-10-21 NOTE — Telephone Encounter (Signed)
Called and spoke to patient.  Patient is calling to get sleep study results.  Patient stated after the results he would like an order for new machine. Dr. Annamaria Boots please advise. Thank you!

## 2018-10-23 NOTE — Telephone Encounter (Signed)
Left message for patient to advise we are still awaiting results from Dr. Annamaria Boots.

## 2018-10-24 DIAGNOSIS — G4733 Obstructive sleep apnea (adult) (pediatric): Secondary | ICD-10-CM | POA: Diagnosis not present

## 2018-10-24 NOTE — Procedures (Signed)
Patient Name: Christian Bowen, Christian Bowen Date: 10/16/2018 Gender: Male D.O.B: 06-02-1960 Age (years): 42 Referring Provider: Baird Lyons MD, ABSM Height (inches): 72 Interpreting Physician: Baird Lyons MD, ABSM Weight (lbs): 300 RPSGT: Zadie Rhine BMI: 41 MRN: [id number] Neck Size: 18.50  CLINICAL INFORMATION The patient is referred for a CPAP titration to treat sleep apnea. Date of NPSG, Split Night or HST:   HST 09/24/2018  AHI 59.1/ hr, desaturation to 59%, body weight 302 lbs  SLEEP STUDY TECHNIQUE As per the AASM Manual for the Scoring of Sleep and Associated Events v2.3 (April 2016) with a hypopnea requiring 4% desaturations.  The channels recorded and monitored were frontal, central and occipital EEG, electrooculogram (EOG), submentalis EMG (chin), nasal and oral airflow, thoracic and abdominal wall motion, anterior tibialis EMG, snore microphone, electrocardiogram, and pulse oximetry. Continuous positive airway pressure (CPAP) was initiated at the beginning of the study and titrated to treat sleep-disordered breathing.  MEDICATIONS Medications self-administered by patient taken the night of the study : none reported  TECHNICIAN COMMENTS Comments added by technician: NO RESTROOM VISTED Comments added by scorer: N/A  RESPIRATORY PARAMETERS Optimal PAP Pressure (cm): 17 AHI at Optimal Pressure (/hr): 0.6 Overall Minimal O2 (%): 67.0 Supine % at Optimal Pressure (%): 86 Minimal O2 at Optimal Pressure (%): 72.0   SLEEP ARCHITECTURE The study was initiated at 9:49:19 PM and ended at 4:00:32 AM.  Sleep onset time was 15.4 minutes and the sleep efficiency was 93.3%%. The total sleep time was 346.5 minutes.  The patient spent 3.6%% of the night in stage N1 sleep, 61.9%% in stage N2 sleep, 0.0%% in stage N3 and 34.5% in REM.Stage REM latency was 36.0 minutes  Wake after sleep onset was 9.3. Alpha intrusion was absent. Supine sleep was 74.46%.  CARDIAC DATA The 2 lead  EKG demonstrated sinus rhythm. The mean heart rate was 90.8 beats per minute. Other EKG findings include: None.  LEG MOVEMENT DATA The total Periodic Limb Movements of Sleep (PLMS) were 0. The PLMS index was 0.0. A PLMS index of <15 is considered normal in adults.  IMPRESSIONS - The optimal PAP pressure was 17 cm of water. - Central sleep apnea was not noted during this titration (CAI = 0.0/h). - Severe oxygen desaturations were observed during this titration (min O2 = 67.0%). Mean saturation on CPAP 17 was 93.1%. - No snoring was audible during this study. - No cardiac abnormalities were observed during this study. - Clinically significant periodic limb movements were not noted during this study.   DIAGNOSIS - Obstructive Sleep Apnea (327.23 [G47.33 ICD-10])  RECOMMENDATIONS - Trial of CPAP therapy on 17 cm H2O or autopap 15-20 cwp. - Patient wore a Large size Resmed Full Face Mask AirFit F20 mask and heated humidification. - Be careful with alcohol, sedatives and other CNS depressants that may worsen sleep apnea and disrupt normal sleep architecture. - Sleep hygiene should be reviewed to assess factors that may improve sleep quality. - Weight management and regular exercise should be initiated or continued.  [Electronically signed] 10/24/2018 03:42 PM  Baird Lyons MD, [place], American Board of Sleep Medicine   NPI: [id number]                         [place], [place] [facility]  ELECTRONICALLY SIGNED ON:  10/24/2018, 3:37 PM [place] PH: [phone]   FX: [phone] [place]  MEDICINE

## 2018-10-25 ENCOUNTER — Emergency Department (HOSPITAL_COMMUNITY)
Admission: EM | Admit: 2018-10-25 | Discharge: 2018-10-25 | Disposition: A | Discharge status: Home / Self Care | Payer: Medicare Other | Attending: Emergency Medicine | Admitting: Emergency Medicine

## 2018-10-25 ENCOUNTER — Other Ambulatory Visit: Payer: Self-pay

## 2018-10-25 ENCOUNTER — Encounter (HOSPITAL_COMMUNITY): Payer: Self-pay

## 2018-10-25 DIAGNOSIS — Z7984 Long term (current) use of oral hypoglycemic drugs: Secondary | ICD-10-CM | POA: Insufficient documentation

## 2018-10-25 DIAGNOSIS — F17210 Nicotine dependence, cigarettes, uncomplicated: Secondary | ICD-10-CM | POA: Insufficient documentation

## 2018-10-25 DIAGNOSIS — J45909 Unspecified asthma, uncomplicated: Secondary | ICD-10-CM | POA: Insufficient documentation

## 2018-10-25 DIAGNOSIS — E119 Type 2 diabetes mellitus without complications: Secondary | ICD-10-CM | POA: Diagnosis not present

## 2018-10-25 DIAGNOSIS — M542 Cervicalgia: Secondary | ICD-10-CM | POA: Insufficient documentation

## 2018-10-25 DIAGNOSIS — I10 Essential (primary) hypertension: Secondary | ICD-10-CM | POA: Insufficient documentation

## 2018-10-25 DIAGNOSIS — Z79899 Other long term (current) drug therapy: Secondary | ICD-10-CM | POA: Diagnosis not present

## 2018-10-25 MED ORDER — KETOROLAC TROMETHAMINE 60 MG/2ML IM SOLN
15.0000 mg | Freq: Once | INTRAMUSCULAR | Status: AC
  Administered 2018-10-25: 15 mg via INTRAMUSCULAR
  Filled 2018-10-25: qty 2

## 2018-10-25 MED ORDER — DICLOFENAC SODIUM 1 % TD GEL: 4.0000 g | Freq: Four times a day (QID) | TRANSDERMAL | 1 refills | Status: DC

## 2018-10-25 MED ORDER — LIDOCAINE 5 % EX PTCH: 1.0000 | MEDICATED_PATCH | CUTANEOUS | 0 refills | Status: DC

## 2018-10-25 NOTE — ED Provider Notes (Signed)
[facility] EMERGENCY DEPARTMENT Provider Note   CSN: [id number] Arrival date & time: 10/25/18  5277     History   Chief Complaint No chief complaint on file.   HPI Christian Bowen is a 58 y.o. male.     HPI   Christian Bowen is a 58 y.o. male, with a history of DM, hyperlipidemia, asthma, HTN, obesity, presenting to the ED with neck pain for almost 3 months.  Patient states he woke up with neck stiffness and pain.  He continues to describe the pain as a stiffness and tightness, moderate to severe, radiating into the left trapezius. He states he has been seen by his PCP for this issue.  He has been intermittently using Advil, Flexeril, heating pad, and OTC muscle creams, all with temporary relief. He states he has a spine and pain management specialist, Dr. Sheliah Mends, but has not brought this issue to him.  He denies fever/chills, falls/trauma, numbness, weakness, chest pain, shortness of breath, vision changes, headache, syncope, other neuro deficits, or any other complaints.     Past Medical History:  Diagnosis Date  . Apnea, sleep    was tested 2005  . Asthma   . Diabetes mellitus   . Hyperlipemia 10/12/2013  . Hypertension   . Lumbar post-laminectomy syndrome 04/02/2013  . Obesity   . Pain in joint, shoulder region 10/12/2013  . Sleep apnea    CPAP-has had UPPP-tonsils and turb red  . Tobacco abuse 03/13/2013    Patient Active Problem List   Diagnosis Date Noted  . Pancreatitis 08/18/2016  . Alcohol consumption binge drinking 08/18/2016  . Essential hypertension, benign 10/12/2013  . Hyperlipemia 10/12/2013  . DM (diabetes mellitus), secondary, uncontrolled, with peripheral vascular complications ([place]) 82/42/3536  . Pain in joint, shoulder region 10/12/2013  . Lumbar post-laminectomy syndrome 04/02/2013  . Obstructive sleep apnea 03/13/2013  . Morbid obesity ([place]) 03/13/2013  . Asthma, moderate persistent 03/13/2013  . Tobacco abuse  03/13/2013    Past Surgical History:  Procedure Laterality Date  . BACK SURGERY  2009  . DORSAL COMPARTMENT RELEASE Left 08/13/2012   Procedure: RELEASE DORSAL COMPARTMENT (DEQUERVAIN) and exploration of radial nerve branch;  Surgeon: Tennis Must, MD;  Location: [facility];  Service: Orthopedics;  Laterality: Left;  . GANGLION CYST EXCISION  2/14-SCG   lt wrist   . GANGLION CYST EXCISION Left 08/13/2012   Procedure: REMOVAL GANGLION OF WRIST;  Surgeon: Tennis Must, MD;  Location: [facility];  Service: Orthopedics;  Laterality: Left;  . heel spurs     bilateral feel  . NASAL TURBINATE REDUCTION  8/13   septoplasty-UPPP-tonsils  . SHOULDER ARTHROSCOPY Right 07/02/2013   Procedure: RIGHT SHOULDER ARTHROSCOPY WITH EXTENSIVE DEBRIDEMENT OF ROTATOR CUFF AND LABRUM, SUBACROMIAL DECOMPRESSION, PARTIAL ACROMIOPLASTY WITH CORACOACROMIAL RELEASE;  Surgeon: Yvette Rack., MD;  Location: [facility];  Service: Orthopedics;  Laterality: Right;  . SHOULDER ARTHROSCOPY WITH BICEPSTENOTOMY Right 04/15/2014   Procedure: SHOULDER ARTHROSCOPY WITH BICEPSTENOTOMY;  Surgeon: Earlie Server, MD;  Location: [facility];  Service: Orthopedics;  Laterality: Right;  . SPINAL CORD STIMULATOR IMPLANT  1997  . SPINAL CORD STIMULATOR INSERTION N/A 04/02/2013   Procedure: LUMBAR SPINAL CORD STIMULATOR Insertion;  Surgeon: Bonna Gains, MD;  Location: [facility];  Service: Neurosurgery;  Laterality: N/A;  . TONSILLECTOMY          Home Medications    Prior to Admission medications   Medication  Sig Start Date End Date Taking? Authorizing Provider  albuterol (PROVENTIL) (2.5 MG/3ML) 0.083% nebulizer solution INHALE CONTENTS OF 1 VIAL IN NEBULIZER EVERY 6 HOURS IF NEEDED FOR WHEEZING OR SHORTNESS OF BREATH 09/17/18   Young, Joni Fears D, MD  atorvastatin (LIPITOR) 40 MG tablet Take 40 mg by mouth daily.    [provider]  diclofenac sodium (VOLTAREN)  1 % GEL Apply 4 g topically 4 (four) times daily. 10/25/18   Juri Dinning C, PA-C  fluticasone furoate-vilanterol (BREO ELLIPTA) 100-25 MCG/INH AEPB TAKE 1 INHALATION BY MOUTH ONCE DAILY THEN RINSE MOUTH AFTER USE 09/17/18   Jetty Duhamel D, MD  glimepiride (AMARYL) 4 MG tablet Take 4 mg by mouth daily with breakfast.    [provider]  lidocaine (LIDODERM) 5 % Place 1 patch onto the skin daily. Remove & Discard patch within 12 hours or as directed by MD 10/25/18   Alix Stowers C, PA-C  lisinopril-hydrochlorothiazide (PRINZIDE,ZESTORETIC) 20-25 MG per tablet Take 1 tablet by mouth daily.    [provider]  PROAIR HFA 108 [phone] Base) MCG/ACT inhaler INHALE 2 PUFFS EVERY 6 HOURS 09/17/18   Young, Joni Fears D, MD  sitaGLIPtan-metformin (JANUMET) 50-1000 MG per tablet Take 1 tablet by mouth 2 (two) times daily with a meal.    [provider]    Family History Family History  Problem Relation Age of Onset  . Hypertension Mother   . Heart attack Father     Social History Social History   Tobacco Use  . Smoking status: Current Some Day Smoker    Packs/day: 0.25    Years: 30.00    Pack years: 7.50    Types: Cigarettes  . Smokeless tobacco: Never Used  . Tobacco comment: 1 pack will last him 2 weeks   Substance Use Topics  . Alcohol use: Yes    Alcohol/week: 0.0 standard drinks    Comment: 1 pint over the weekend...its not all the time  . Drug use: Yes    Types: Marijuana    Comment: none for 1 yr.-10/2012     Allergies   Hydromorphone hcl   Review of Systems Review of Systems  Constitutional: Negative for chills, diaphoresis and fever.  HENT: Negative for trouble swallowing.   Eyes: Negative for visual disturbance.  Respiratory: Negative for cough and shortness of breath.   Cardiovascular: Negative for chest pain.  Gastrointestinal: Negative for abdominal pain, nausea and vomiting.  Musculoskeletal: Positive for neck pain. Negative for back pain.   Neurological: Negative for dizziness, syncope, facial asymmetry, speech difficulty, weakness, light-headedness, numbness and headaches.  All other systems reviewed and are negative.    Physical Exam Updated Vital Signs BP 134/88 (BP Location: Right Arm)   Pulse 92   Temp 98.3 F (36.8 C) (Oral)   Resp 16   Ht 6' (1.829 m)   Wt 133.8 kg   SpO2 98%   BMI 40.01 kg/m   Physical Exam Vitals signs and nursing note reviewed.  Constitutional:      General: He is not in acute distress.    Appearance: He is well-developed. He is not diaphoretic.  HENT:     Head: Normocephalic and atraumatic.     Mouth/Throat:     Mouth: Mucous membranes are moist.     Pharynx: Oropharynx is clear.  Eyes:     Extraocular Movements: Extraocular movements intact.     Conjunctiva/sclera: Conjunctivae normal.     Pupils: Pupils are equal, round, and reactive to light.  Neck:     Musculoskeletal: Neck supple.  Cardiovascular:     Rate and Rhythm: Normal rate and regular rhythm.     Pulses: Normal pulses.          Radial pulses are 2+ on the right side and 2+ on the left side.       Posterior tibial pulses are 2+ on the right side and 2+ on the left side.     Comments: Tactile temperature in the extremities appropriate and equal bilaterally. Pulmonary:     Effort: Pulmonary effort is normal. No respiratory distress.  Abdominal:     Tenderness: There is no guarding.  Musculoskeletal:     Right lower leg: No edema.     Left lower leg: No edema.  Lymphadenopathy:     Cervical: No cervical adenopathy.  Skin:    General: Skin is warm and dry.  Neurological:     Mental Status: He is alert.     Comments: Sensation grossly intact to light touch in the extremities.  Grip strengths equal bilaterally.  Strength 5/5 in all extremities. No gait disturbance. Coordination intact. Cranial nerves III-XII grossly intact. No facial droop.   Sensation grossly intact to light touch through each of the nerve  distributions of the bilateral upper extremities. Abduction and adduction of the fingers intact against resistance. Grip strength equal bilaterally. Supination and pronation intact against resistance. Strength 5/5 through the cardinal directions of the bilateral wrists. Strength 5/5 with flexion and extension of the bilateral elbows. Patient can touch the thumb to each one of the fingertips without difficulty.  Patient can hold the "OK" sign against resistance.  Psychiatric:        Mood and Affect: Mood and affect normal.        Speech: Speech normal.        Behavior: Behavior normal.      ED Treatments / Results  Labs (all labs ordered are listed, but only abnormal results are displayed) Labs Reviewed - No data to display  EKG None  Radiology No results found.  Procedures Procedures (including critical care time)  Medications Ordered in ED Medications  ketorolac (TORADOL) injection 15 mg (has no administration in time range)     Initial Impression / Assessment and Plan / ED Course  I have reviewed the triage vital signs and the nursing notes.  Pertinent labs & imaging results that were available during my care of the patient were reviewed by me and considered in my medical decision making (see chart for details).        Patient presents with neck pain for the past several months.  No focal neuro deficits.  Low suspicion for vascular compromise based on symptom duration and symptom description and physical exam is not suggestive. Patient has a spine and pain management specialist with whom he can follow-up. The patient was given instructions for home care as well as return precautions. Patient voices understanding of these instructions, accepts the plan, and is comfortable with discharge.  Final Clinical Impressions(s) / ED Diagnoses   Final diagnoses:  Neck pain    ED Discharge Orders         Ordered    diclofenac sodium (VOLTAREN) 1 % GEL  4 times daily      10/25/18 0959    lidocaine (LIDODERM) 5 %  Every 24 hours     10/25/18 0959           Anselm PancoastJoy, Riki Berninger C, PA-C 10/25/18 1007  Bero, MichaeSabas Sousl M, MD 10/26/18 [phone]

## 2018-10-25 NOTE — ED Triage Notes (Signed)
Patient complains of posterior neck pain x 2 months. Describes pain as worse with any movement and pain when supine, denies trauma

## 2018-10-25 NOTE — Discharge Instructions (Addendum)
Take it easy, but do not lay around too much as this may make any stiffness worse.  Antiinflammatory medications: Take 600 mg of ibuprofen every 6 hours or 440 mg (over the counter dose) to 500 mg (prescription dose) of naproxen every 12 hours for the next 3 days. After this time, these medications may be used as needed for pain. Take these medications with food to avoid upset stomach. Choose only one of these medications, do not take them together. Acetaminophen (generic for Tylenol): Should you continue to have additional pain while taking the ibuprofen or naproxen, you may add in acetaminophen as needed. Your daily total maximum amount of acetaminophen from all sources should be limited to 4000mg /day for persons without liver problems, or 2000mg /day for those with liver problems. Cyclobenzaprine: Continue to take this muscle relaxer, as needed, for stiff muscles.  Please keep in mind that this medication can cause drowsiness. Lidocaine patches: These are available via either prescription or over-the-counter. The over-the-counter option may be more economical one and are likely just as effective. There are multiple over-the-counter brands, such as Salonpas. Exercises: Be sure to perform the attached exercises starting with three times a week and working up to performing them daily. This is an essential part of preventing long term problems.  Follow up: Follow-up with your spine and pain management specialist for any further management of this issue. Return: Return to the ED should symptoms worsen, you experience weakness in the arms or legs, numbness in the extremities, numbness in the face, worst headache, passing out, vision loss, or any other major concerns.  For prescription assistance, may try using prescription discount sites or apps, such as goodrx.com

## 2018-10-27 NOTE — Telephone Encounter (Signed)
CY just want to make sure you received this message.

## 2018-10-27 NOTE — Telephone Encounter (Signed)
Called and spoke with pt regarding CY's results and recommendations. Pt expressed understanding with no additional questions. Order for DME, New CPAP auto 15-20, mask of choice, humidifier, supplies, AirView/Card has been placed. Pt has a f/u visit 01/11/2019 with CY. Nothing further needed at this time.

## 2018-10-27 NOTE — Telephone Encounter (Signed)
Mr Cohen had CPAP titration study on 9/11. Recommend we order DME,  New CPAP auto 15-20, mask of choice, humidifier, supplies, AirView. Card Please make sure he has a f/u appointment with [place] in 31-90 days per insurance regs.

## 2018-11-11 ENCOUNTER — Other Ambulatory Visit: Payer: Self-pay | Admitting: Internal Medicine

## 2018-11-11 ENCOUNTER — Ambulatory Visit
Admission: RE | Admit: 2018-11-11 | Discharge: 2018-11-11 | Disposition: A | Discharge status: Home / Self Care | Payer: Medicare Other | Source: Ambulatory Visit | Attending: Internal Medicine | Admitting: Internal Medicine

## 2018-11-11 DIAGNOSIS — M542 Cervicalgia: Secondary | ICD-10-CM

## 2018-12-07 ENCOUNTER — Ambulatory Visit: Payer: Medicare Other | Admitting: Podiatry

## 2018-12-09 ENCOUNTER — Other Ambulatory Visit: Payer: Medicare Other

## 2018-12-28 ENCOUNTER — Ambulatory Visit: Payer: Medicare Other | Admitting: Orthotics

## 2018-12-28 ENCOUNTER — Ambulatory Visit (INDEPENDENT_AMBULATORY_CARE_PROVIDER_SITE_OTHER): Payer: Medicare Other | Admitting: Podiatry

## 2018-12-28 ENCOUNTER — Other Ambulatory Visit: Payer: Self-pay

## 2018-12-28 ENCOUNTER — Ambulatory Visit (INDEPENDENT_AMBULATORY_CARE_PROVIDER_SITE_OTHER): Payer: Medicare Other

## 2018-12-28 ENCOUNTER — Other Ambulatory Visit: Payer: Self-pay | Admitting: Podiatry

## 2018-12-28 DIAGNOSIS — M79671 Pain in right foot: Secondary | ICD-10-CM

## 2018-12-28 DIAGNOSIS — M2141 Flat foot [pes planus] (acquired), right foot: Secondary | ICD-10-CM

## 2018-12-28 DIAGNOSIS — M205X2 Other deformities of toe(s) (acquired), left foot: Secondary | ICD-10-CM | POA: Diagnosis not present

## 2018-12-28 DIAGNOSIS — M779 Enthesopathy, unspecified: Secondary | ICD-10-CM

## 2018-12-28 DIAGNOSIS — E1151 Type 2 diabetes mellitus with diabetic peripheral angiopathy without gangrene: Secondary | ICD-10-CM

## 2018-12-28 DIAGNOSIS — M2142 Flat foot [pes planus] (acquired), left foot: Secondary | ICD-10-CM

## 2018-12-28 NOTE — Patient Instructions (Signed)

## 2018-12-28 NOTE — Progress Notes (Signed)
Order [id number].Marland Kitchenone pair of inserts

## 2019-01-04 NOTE — Progress Notes (Signed)
Subjective: 58 year old male presents the office today for yearly diabetic foot exam.  He states he has been doing well.  His A1c however was in the 11 he states is down to a 9.  Denies any open sores.  Denies any claudication symptoms.  Still has some occasional discomfort of the right foot but that shoes are been helpful. Denies any systemic complaints such as fevers, chills, nausea, vomiting. No acute changes since last appointment, and no other complaints at this time.   Objective: AAO x3, NAD DP pulses 2/4, PT pulses 1/4 Sensation intact to Semmes-Weinstein monofilament.  Vibratory sensation intact. There is no area of pinpoint tenderness identified at this time.  There is no significant edema, erythema.  Patient is in discomfort Achilles tendon but [place] Silos test is negative the Achilles tendon appears to be intact. No open lesions or pre-ulcerative lesions.  No pain with calf compression, swelling, warmth, erythema  Assessment: 58 year old male Achilles tendinitis, diabetic foot exam  Plan: -All treatment options discussed with the patient including all alternatives, risks, complications.  -Today was measured for diabetic shoes with Liliane Channel.  We will call him with the comment.  Discussed exercises for Achilles tendinitis.  If no improvement or if there is any worsening to let me know.  Otherwise we will see him back in 1 year. -Patient encouraged to call the office with any questions, concerns, change in symptoms.    Trula Slade DPM

## 2019-01-11 ENCOUNTER — Ambulatory Visit (INDEPENDENT_AMBULATORY_CARE_PROVIDER_SITE_OTHER): Payer: Medicare Other | Admitting: Internal Medicine

## 2019-01-11 ENCOUNTER — Encounter: Payer: Self-pay | Admitting: Internal Medicine

## 2019-01-11 ENCOUNTER — Other Ambulatory Visit: Payer: Self-pay

## 2019-01-11 DIAGNOSIS — Z720 Tobacco use: Secondary | ICD-10-CM | POA: Diagnosis not present

## 2019-01-11 DIAGNOSIS — E669 Obesity, unspecified: Secondary | ICD-10-CM

## 2019-01-11 DIAGNOSIS — J449 Chronic obstructive pulmonary disease, unspecified: Secondary | ICD-10-CM | POA: Diagnosis not present

## 2019-01-11 DIAGNOSIS — G4733 Obstructive sleep apnea (adult) (pediatric): Secondary | ICD-10-CM

## 2019-01-11 DIAGNOSIS — J4540 Moderate persistent asthma, uncomplicated: Secondary | ICD-10-CM

## 2019-01-11 DIAGNOSIS — Z9119 Patient's noncompliance with other medical treatment and regimen: Secondary | ICD-10-CM

## 2019-01-11 MED ORDER — PROAIR HFA 108 (90 BASE) MCG/ACT IN AERS: INHALATION_SPRAY | RESPIRATORY_TRACT | 12 refills | Status: DC

## 2019-01-11 MED ORDER — ALBUTEROL SULFATE (2.5 MG/3ML) 0.083% IN NEBU: INHALATION_SOLUTION | RESPIRATORY_TRACT | 12 refills | Status: DC

## 2019-01-11 MED ORDER — BREO ELLIPTA 100-25 MCG/INH IN AEPB: INHALATION_SPRAY | RESPIRATORY_TRACT | 11 refills | Status: DC

## 2019-01-11 NOTE — Patient Instructions (Signed)
Refill scripts were e-sent for breathing medicines  Please try very hard to use your CPAP all night every night. We don't want the insurance company to take it away from you because you aren't using it enough. Untreated sleep apnea is not good for your heart.  Please try to stop smoking  Congratulations on getting your weight down.  Please call if we can help

## 2019-01-11 NOTE — Progress Notes (Signed)
HPI M smoker followed for OSA, s/p UPPP, tobacco abuse, complicated by HBP, DM 2, tobacco, morbid obesity, cervical neuralgia, back pain NPSG 11/11/04 [facility])- AHI 43/ hr, body weight 270 lbs CPAP. Subsequent weight gain  PFT: 05/04/13-mild to moderate obstructive airways disease, insignificant response to bronchodilator, normal diffusion, minimal restriction. FVC 3.52/78%, FEV1 2.56/72%, FEV1/FVC 0.73/91%, FEF 25-75% 1.74/51%. TLC 73%, DLCO 108%. Office Spirometry 05/21/2016 mild obstruction small airways-FVC 3.57/81%, FEV1 2.51/72%, ratio 0.70, FEF 25-75%  1.65/ 50% HST 09/24/2018 AHI 59.1/ hr, desaturation to 59%, body weight 302 lbs ----------------------------------------------------------------------------------------------------------   09/17/2018-  58 year old male smoker followed for OSA,  s/p UPPP, tobacco abuse, mild COPD ,complicated by HBP, DM 2, tobacco, morbid obesity CPAP 16/[facility]   No Download available- poorly compliant -----OSA on CPAP, inquiring about oral appliance, states he will need another sleep study  Body weight today 302 lbs   Smokes 1 pack/ 2 weeks He has looked on-line at oral appliances, but agrees to let us refer to someone who can discuss this option in person.  Has lost 34 lbs on his own- strongly encouraged to continue this effort.  Still smoking- encouraged to quit  01/11/2019- Virtual Visit via Telephone Note  I connected with Celene Squibb on 01/11/19 at 10:30 AM EST by telephone and verified that I am speaking with the correct person using two identifiers.  Location: Patient: H Provider: O   I discussed the limitations, risks, security and privacy concerns of performing an evaluation and management service by telephone and the availability of in person appointments. I also discussed with the patient that there may be a patient responsible charge related to this service. The patient expressed understanding and agreed to proceed.   History of  Present Illness: 58 year old male smoker followed for OSA,  s/p UPPP, tobacco abuse, mild COPD ,complicated by HBP, DM 2, tobacco, morbid obesity CPAP auto 15-20/[facility]  Download compliance 13%, AHI 0.7/ hr Says he falls asleep in front of tv before putting mask on.  HST 09/24/2018 AHI 59.1/ hr, desaturation to 59%, body weight 302 lbs He met Dr Ron Parker- not good candidate for oral appliance.  Met with ENT- not good candidate for Inspire surgery. Says he has lost 50 lbs- weighed 328 ls 01/09/18. Discussed his smoking and pressed him to try to quit.     Observations/Objective: HST 09/24/2018 AHI 59.1/ hr, desaturation to 59%, body weight 302 lbs-    Assessment and Plan: OSA- not compliant. Not a good candidate for other interventions.  Obesity- encouraged to work more on weight. Tobacco- encouraged smoking effort COPD- refilling Breo, ProAir, neb solution  Follow Up Instructions: 4 months    I discussed the assessment and treatment plan with the patient. The patient was provided an opportunity to ask questions and all were answered. The patient agreed with the plan and demonstrated an understanding of the instructions.   The patient was advised to call back or seek an in-person evaluation if the symptoms worsen or if the condition fails to improve as anticipated.  I provided 22 minutes of non-face-to-face time during this encounter.   Baird Lyons, MD    ROS-see HPI  + = positive Constitutional:   No-   weight loss, night sweats, fevers, chills, +fatigue, lassitude. HEENT:   No-  headaches, difficulty swallowing, tooth/dental problems, sore throat,       No-  sneezing, itching, ear ache, nasal congestion, post nasal drip,  CV:  No-   chest pain, orthopnea, PND, swelling in lower extremities,  anasarca,                                                          dizziness, palpitations Resp: No-   shortness of breath with exertion or at rest.              No-   productive cough,  +  non-productive cough,  No- coughing up of blood.              No-   change in color of mucus.  + wheezing.   Skin: No-   rash or lesions. GI:  No-   heartburn, indigestion, abdominal pain, nausea, vomiting,  GU:  MS:  No-   joint pain or swelling.    + back pain. Neuro-     nothing unusual Psych:  No- change in mood or affect. No depression or anxiety.  No memory loss.  OBJ- Physical Exam General- Alert, Oriented, Affect-appropriate, Distress- none acute, +obese  Skin- rash-none, lesions- none, excoriation- none Lymphadenopathy- none Head- atraumatic            Eyes- Gross vision intact, PERRLA, conjunctivae and secretions clear            Ears- Hearing, canals-normal            Nose- Clear, no-Septal dev, mucus, polyps, erosion, perforation             Throat- Mallampati II/ UPPP , mucosa clear/not dry , drainage- none, tonsils- atrophic,  Neck- flexible , trachea midline, no stridor , thyroid nl, carotid no bruit Chest - symmetrical excursion , unlabored           Heart/CV- RRR , no murmur , no gallop  , no rub, nl s1 s2                           - JVD- none , edema- none, stasis changes- none, varices- none           Lung-  + clear unlabored, cough+  , dullness-none, rub- none           Chest wall-  Abd-  Br/ Gen/ Rectal- Not done, not indicated Extrem- cyanosis- none, clubbing, none, atrophy- none, strength- nl Neuro- grossly intact to observation

## 2019-02-08 ENCOUNTER — Telehealth: Payer: Self-pay | Admitting: Internal Medicine

## 2019-02-08 NOTE — Telephone Encounter (Signed)
Spoke with pt, states that he is not tolerating cpap well.  States that he wakes up with his belly distended, experiences stomach pain, belching, and flatulence throughout the day since starting cpap therapy. Pt is requesting additional recs.  cpap download has been placed on CY's desk. CY please advise on further recs.  Thanks!

## 2019-02-09 ENCOUNTER — Other Ambulatory Visit: Payer: Self-pay

## 2019-02-09 ENCOUNTER — Ambulatory Visit (INDEPENDENT_AMBULATORY_CARE_PROVIDER_SITE_OTHER): Payer: Medicare Other | Admitting: Orthotics

## 2019-02-09 DIAGNOSIS — M205X2 Other deformities of toe(s) (acquired), left foot: Secondary | ICD-10-CM

## 2019-02-09 DIAGNOSIS — M2141 Flat foot [pes planus] (acquired), right foot: Secondary | ICD-10-CM | POA: Diagnosis not present

## 2019-02-09 DIAGNOSIS — M7752 Other enthesopathy of left foot: Secondary | ICD-10-CM

## 2019-02-09 DIAGNOSIS — M79671 Pain in right foot: Secondary | ICD-10-CM

## 2019-02-09 DIAGNOSIS — E1151 Type 2 diabetes mellitus with diabetic peripheral angiopathy without gangrene: Secondary | ICD-10-CM

## 2019-02-09 DIAGNOSIS — M7751 Other enthesopathy of right foot: Secondary | ICD-10-CM | POA: Diagnosis not present

## 2019-02-09 DIAGNOSIS — M2142 Flat foot [pes planus] (acquired), left foot: Secondary | ICD-10-CM | POA: Diagnosis not present

## 2019-02-09 DIAGNOSIS — M779 Enthesopathy, unspecified: Secondary | ICD-10-CM

## 2019-02-09 DIAGNOSIS — G4733 Obstructive sleep apnea (adult) (pediatric): Secondary | ICD-10-CM

## 2019-02-09 NOTE — Progress Notes (Signed)
Patient came in today to pick up diabetic shoes and custom inserts.  Same was well pleased with fit and function.   The patient could ambulate without any discomfort; there were no signs of any quality issues. The foot ortheses offered full contact with plantar surface and contoured the arch well.   The shoes fit well with no heel slippage and areas of pressure concern.   Patient advised to contact us if any problems arise.  Patient also advised on how to report any issues.  Patient just wanted one pair of inserts.

## 2019-02-09 NOTE — Telephone Encounter (Signed)
Please order DME [facility]  reduce autopap range to 10-15  It may also help him to take an otc medicine like simethicone ( Gas-X) at bedtime to help with bloating

## 2019-02-09 NOTE — Telephone Encounter (Signed)
Spoke with patient regarding CPAP pressure. Per Dr. Maple Hudson I put the order in for [facility] to change the pressure and try to take some Gas-X before bedtime.Patient voiced understanding. Nothing else further needed.

## 2019-03-02 NOTE — Assessment & Plan Note (Signed)
Encouragement and support offered. He doesn't seem motivated to address this yet.

## 2019-03-02 NOTE — Assessment & Plan Note (Signed)
No exacerbations, fair control Plan- smoking cessation, med refills

## 2019-03-02 NOTE — Assessment & Plan Note (Addendum)
Trying to motivate him to work on this. Poor sleep hygiene and his unwillingness to let girlfriends see his CPAP are obstacles. Education done

## 2019-03-02 NOTE — Assessment & Plan Note (Signed)
He reports good weight loss, to be confirmed at next ov.  This would help a number of his problems if succeessful

## 2019-05-10 ENCOUNTER — Ambulatory Visit (INDEPENDENT_AMBULATORY_CARE_PROVIDER_SITE_OTHER): Payer: Medicare Other | Admitting: Otolaryngology

## 2019-05-12 ENCOUNTER — Ambulatory Visit: Payer: Medicare Other | Admitting: Internal Medicine

## 2019-05-12 ENCOUNTER — Encounter: Payer: Self-pay | Admitting: Internal Medicine

## 2019-05-13 ENCOUNTER — Other Ambulatory Visit: Payer: Self-pay

## 2019-05-13 ENCOUNTER — Encounter: Payer: Self-pay | Admitting: Internal Medicine

## 2019-05-13 ENCOUNTER — Ambulatory Visit (INDEPENDENT_AMBULATORY_CARE_PROVIDER_SITE_OTHER): Payer: Medicare Other | Admitting: Internal Medicine

## 2019-05-13 DIAGNOSIS — J4540 Moderate persistent asthma, uncomplicated: Secondary | ICD-10-CM

## 2019-05-13 DIAGNOSIS — G4733 Obstructive sleep apnea (adult) (pediatric): Secondary | ICD-10-CM

## 2019-05-13 DIAGNOSIS — Z720 Tobacco use: Secondary | ICD-10-CM

## 2019-05-13 MED ORDER — ALBUTEROL SULFATE (2.5 MG/3ML) 0.083% IN NEBU: INHALATION_SOLUTION | RESPIRATORY_TRACT | 12 refills | Status: AC

## 2019-05-13 MED ORDER — PROAIR HFA 108 (90 BASE) MCG/ACT IN AERS: INHALATION_SPRAY | RESPIRATORY_TRACT | 12 refills | Status: DC

## 2019-05-13 MED ORDER — BREO ELLIPTA 100-25 MCG/INH IN AEPB: INHALATION_SPRAY | RESPIRATORY_TRACT | 11 refills | Status: DC

## 2019-05-13 NOTE — Progress Notes (Signed)
HPI M smoker followed for OSA, s/p UPPP, tobacco abuse, complicated by HBP, DM 2, tobacco, morbid obesity, cervical neuralgia, back pain NPSG 11/11/04 [facility])- AHI 43/ hr, body weight 270 lbs CPAP. Subsequent weight gain  PFT: 05/04/13-mild to moderate obstructive airways disease, insignificant response to bronchodilator, normal diffusion, minimal restriction. FVC 3.52/78%, FEV1 2.56/72%, FEV1/FVC 0.73/91%, FEF 25-75% 1.74/51%. TLC 73%, DLCO 108%. Office Spirometry 05/21/2016 mild obstruction small airways-FVC 3.57/81%, FEV1 2.51/72%, ratio 0.70, FEF 25-75%  1.65/ 50% HST 09/24/2018 AHI 59.1/ hr, desaturation to 59%, body weight 302 lbs ----------------------------------------------------------------------------------------------------------  01/11/2019- Virtual Visit via Telephone Note  I connected with Christian Bowen on 01/11/19 at 10:30 AM EST by telephone and verified that I am speaking with the correct person using two identifiers.  Location: Patient: H Provider: O   I discussed the limitations, risks, security and privacy concerns of performing an evaluation and management service by telephone and the availability of in person appointments. I also discussed with the patient that there may be a patient responsible charge related to this service. The patient expressed understanding and agreed to proceed.   History of Present Illness: 59 year old male smoker followed for OSA,  s/p UPPP, tobacco abuse, mild COPD ,complicated by HBP, DM 2, tobacco, morbid obesity CPAP auto 15-20/[facility]  Download compliance 13%, AHI 0.7/ hr Says he falls asleep in front of tv before putting mask on.  HST 09/24/2018 AHI 59.1/ hr, desaturation to 59%, body weight 302 lbs He met Dr Ron Parker- not good candidate for oral appliance.  Met with ENT- not good candidate for [facility] surgery. Says he has lost 50 lbs- weighed 328 ls 01/09/18. Discussed his smoking and pressed him to try to quit.      Observations/Objective: HST 09/24/2018 AHI 59.1/ hr, desaturation to 59%, body weight 302 lbs-    Assessment and Plan: OSA- not compliant. Not a good candidate for other interventions.  Obesity- encouraged to work more on weight. Tobacco- encouraged smoking effort COPD- refilling Breo, ProAir, neb solution  Follow Up Instructions: 4 months     I discussed the assessment and treatment plan with the patient. The patient was provided an opportunity to ask questions and all were answered. The patient agreed with the plan and demonstrated an understanding of the instructions.   The patient was advised to call back or seek an in-person evaluation if the symptoms worsen or if the condition fails to improve as anticipated.  I provided 22 minutes of non-face-to-face time during this encounter.   Baird Lyons, MD   05/13/19-59 year old male smoker followed for OSA,  s/p UPPP, tobacco abuse, mild COPD ,complicated by HBP, DM 2, tobacco, morbid obesity CPAP auto 10-15/[facility] Download compliance 3%, AHI 1.7/ hr  Body weight 295 lbs Weight loss 335 lbs> 295- says he breathes better ith weight loss.  Uses CPAP at home, not at mother's house or at girlfriend's. Discussed  [facility] for [facility], can't swim and blames this for difficulty with weight.  Being Rx'd for DDD cervical spine. Pain interferes w sleep. No longer wheezing  ROS-see HPI  + = positive Constitutional:   No-   weight loss, night sweats, fevers, chills, +fatigue, lassitude. HEENT:   No-  headaches, difficulty swallowing, tooth/dental problems, sore throat,       No-  sneezing, itching, ear ache, nasal congestion, post nasal drip,  CV:  No-   chest pain, orthopnea, PND, swelling in lower extremities, anasarca,  dizziness, palpitations Resp: No-   shortness of breath with exertion or at rest.              No-   productive cough,   non-productive cough,  No-  coughing up of blood.              No-   change in color of mucus.   wheezing.   Skin: No-   rash or lesions. GI:  No-   heartburn, indigestion, abdominal pain, nausea, vomiting,  GU:  MS:  No-   joint pain or swelling.    + back pain. Neuro-     nothing unusual Psych:  No- change in mood or affect. No depression or anxiety.  No memory loss.  OBJ- Physical Exam General- Alert, Oriented, Affect-appropriate, Distress- none acute, +obese  Skin- rash-none, lesions- none, excoriation- none Lymphadenopathy- none Head- atraumatic            Eyes- Gross vision intact, PERRLA, conjunctivae and secretions clear            Ears- Hearing, canals-normal            Nose- Clear, no-Septal dev, mucus, polyps, erosion, perforation             Throat- Mallampati II/ UPPP , mucosa clear/not dry , drainage- none, tonsils- atrophic,  Neck- flexible , trachea midline, no stridor , thyroid nl, carotid no bruit Chest - symmetrical excursion , unlabored           Heart/CV- RRR , no murmur , no gallop  , no rub, nl s1 s2                           - JVD- none , edema- none, stasis changes- none, varices- none           Lung-  + clear unlabored, cough- none  , dullness-none, rub- none           Chest wall-  Abd-  Br/ Gen/ Rectal- Not done, not indicated Extrem- cyanosis- none, clubbing, none, atrophy- none, strength- nl Neuro- grossly intact to observation

## 2019-05-13 NOTE — Patient Instructions (Signed)
You will be better off if you lose weight and stop smoking as discussed.  Keeping your weight down and sleeping off the flat of your back will help with the sleep apnea.  If you can get into a rhythm of using CPAP every night, that will be good for you.  Please call if we can help

## 2019-05-17 ENCOUNTER — Other Ambulatory Visit: Payer: Self-pay

## 2019-05-17 ENCOUNTER — Ambulatory Visit (INDEPENDENT_AMBULATORY_CARE_PROVIDER_SITE_OTHER): Payer: Medicare Other | Admitting: Otolaryngology

## 2019-05-17 VITALS — Temp 97.5°F

## 2019-05-17 DIAGNOSIS — J342 Deviated nasal septum: Secondary | ICD-10-CM

## 2019-05-17 NOTE — Progress Notes (Signed)
HPI: Christian Bowen is a 59 y.o. male who returns today for evaluation of left-sided nasal congestion.  Patient had previous surgery with myself in 2013 at which time he had a septoplasty, turbinate reductions and UPPP.  More recently has noted more trouble breathing through the left side of his nose. Patient has obstructive sleep apnea and uses CPAP.  Past Medical History:  Diagnosis Date  . Apnea, sleep    was tested 2005  . Asthma   . Diabetes mellitus   . Hyperlipemia 10/12/2013  . Hypertension   . Lumbar post-laminectomy syndrome 04/02/2013  . Obesity   . Pain in joint, shoulder region 10/12/2013  . Sleep apnea    CPAP-has had UPPP-tonsils and turb red  . Tobacco abuse 03/13/2013   Past Surgical History:  Procedure Laterality Date  . BACK SURGERY  2009  . DORSAL COMPARTMENT RELEASE Left 08/13/2012   Procedure: RELEASE DORSAL COMPARTMENT (DEQUERVAIN) and exploration of radial nerve branch;  Surgeon: Tennis Must, MD;  Location: [place];  Service: Orthopedics;  Laterality: Left;  . GANGLION CYST EXCISION  2/14-[facility]   lt wrist   . GANGLION CYST EXCISION Left 08/13/2012   Procedure: REMOVAL GANGLION OF WRIST;  Surgeon: Tennis Must, MD;  Location: [place];  Service: Orthopedics;  Laterality: Left;  . heel spurs     bilateral feel  . NASAL TURBINATE REDUCTION  8/13   septoplasty-UPPP-tonsils  . SHOULDER ARTHROSCOPY Right 07/02/2013   Procedure: RIGHT SHOULDER ARTHROSCOPY WITH EXTENSIVE DEBRIDEMENT OF ROTATOR CUFF AND LABRUM, SUBACROMIAL DECOMPRESSION, PARTIAL ACROMIOPLASTY WITH CORACOACROMIAL RELEASE;  Surgeon: Yvette Rack., MD;  Location: [place];  Service: Orthopedics;  Laterality: Right;  . SHOULDER ARTHROSCOPY WITH BICEPSTENOTOMY Right 04/15/2014   Procedure: SHOULDER ARTHROSCOPY WITH BICEPSTENOTOMY;  Surgeon: Earlie Server, MD;  Location: [place];  Service: Orthopedics;  Laterality: Right;  . SPINAL CORD  STIMULATOR IMPLANT  1997  . SPINAL CORD STIMULATOR INSERTION N/A 04/02/2013   Procedure: LUMBAR SPINAL CORD STIMULATOR Insertion;  Surgeon: Bonna Gains, MD;  Location: [place] [facility];  Service: Neurosurgery;  Laterality: N/A;  . TONSILLECTOMY     Social History   Socioeconomic History  . Marital status: Single    Spouse name: Not on file  . Number of children: Not on file  . Years of education: Not on file  . Highest education level: Not on file  Occupational History  . Occupation: Disabled  Tobacco Use  . Smoking status: Current Some Day Smoker    Packs/day: 0.25    Years: 30.00    Pack years: 7.50    Types: Cigarettes  . Smokeless tobacco: Never Used  . Tobacco comment: 1 pack will last him 2 weeks   Substance and Sexual Activity  . Alcohol use: Yes    Alcohol/week: 0.0 standard drinks    Comment: 1 pint over the weekend...its not all the time  . Drug use: Yes    Types: Marijuana    Comment: none for 1 yr.-10/2012  . Sexual activity: Not on file    Comment: occ-1/2 pk week  Other Topics Concern  . Not on file  Social History Narrative   Lives alone.    Disabled   Social Determinants of Health   Financial Resource Strain:   . Difficulty of Paying Living Expenses:   Food Insecurity:   . Worried About Charity fundraiser in the Last Year:   . [employer]  in the Last Year:   Transportation Needs:   . Freight forwarder (Medical):   Marland Kitchen Lack of Transportation (Non-Medical):   Physical Activity:   . Days of Exercise per Week:   . Minutes of Exercise per Session:   Stress:   . Feeling of Stress :   Social Connections:   . Frequency of Communication with Friends and Family:   . Frequency of Social Gatherings with Friends and Family:   . Attends Religious Services:   . Active Member of Clubs or Organizations:   . Attends Banker Meetings:   Marland Kitchen Marital Status:    Family History  Problem Relation Age of Onset  . Hypertension Mother   . Heart  attack Father    Allergies  Allergen Reactions  . Hydromorphone Hcl     Patient went into respiratory distress   Prior to Admission medications   Medication Sig Start Date End Date Taking? Authorizing Provider  albuterol (PROVENTIL) (2.5 MG/3ML) 0.083% nebulizer solution INHALE CONTENTS OF 1 VIAL IN NEBULIZER EVERY 6 HOURS IF NEEDED FOR WHEEZING OR SHORTNESS OF BREATH 05/13/19   Jetty Duhamel D, MD  atorvastatin (LIPITOR) 40 MG tablet Take 40 mg by mouth daily.    [provider]  fluticasone furoate-vilanterol (BREO ELLIPTA) 100-25 MCG/INH AEPB TAKE 1 INHALATION BY MOUTH ONCE DAILY THEN RINSE MOUTH AFTER USE 05/13/19   Jetty Duhamel D, MD  gabapentin (NEURONTIN) 300 MG capsule Take 300 mg by mouth daily. 02/01/19   [provider]  lisinopril-hydrochlorothiazide (PRINZIDE,ZESTORETIC) 20-25 MG per tablet Take 1 tablet by mouth daily.    [provider]  oxyCODONE-acetaminophen (PERCOCET) 10-325 MG tablet once as needed. 04/20/19   [provider]  PROAIR HFA 108 (90 Base) MCG/ACT inhaler INHALE 2 PUFFS EVERY 6 HOURS 05/13/19   Jetty Duhamel D, MD  sitaGLIPtan-metformin (JANUMET) 50-1000 MG per tablet Take 1 tablet by mouth 2 (two) times daily with a meal.    [provider]  TRULICITY 1.5 MG/0.5ML SOPN  12/28/18   [provider]     Positive ROS: Otherwise negative  All other systems have been reviewed and were otherwise negative with the exception of those mentioned in the HPI and as above.  Physical Exam: Constitutional: Alert, well-appearing, no acute distress Ears: External ears without lesions or tenderness. Ear canals are clear bilaterally with intact, clear TMs.  Nasal: External nose without lesions. Septum is slightly deviated to the left with mild rhinitis.  Clear nasal passages otherwise although he does have some slight nasal valve collapse on the left side. Oral: Lips and gums without lesions. Tongue and palate mucosa without  lesions. Posterior oropharynx clear. Neck: No palpable adenopathy or masses Respiratory: Breathing comfortably  Skin: No facial/neck lesions or rash noted.  Procedures  Assessment: Left nasal valve collapse Slight septal deviation to the left  Plan: Reviewed with him concerning use of nasal steroid spray as well as use of Breathe Right strips or air max device and gave him information on this. If he continues to have problems could consider revision septoplasty.   Narda Bonds, MD

## 2019-06-01 ENCOUNTER — Encounter: Payer: Self-pay | Admitting: Internal Medicine

## 2019-06-01 NOTE — Assessment & Plan Note (Signed)
Doing better with no recent exacerbation Plan- meds refilled.

## 2019-06-01 NOTE — Assessment & Plan Note (Signed)
Emphasize importance of complete smoking cessation. Consider Pharmacy referral.

## 2019-06-01 NOTE — Assessment & Plan Note (Signed)
Again discussed compliance goals. He was turned down for oral appliance and ENT surgery/ [facility]. I am not optimistic, but continue to press for weight loss and CPAP use.

## 2019-06-01 NOTE — Assessment & Plan Note (Signed)
Encouraging increased activity and long term weight loss goal

## 2019-10-29 IMAGING — CT CT CERVICAL SPINE W/O CM
4 of 5 series · 13 of 35 positions shown, 15 images · non-contrast
Comparison: 10/28/2014

CLINICAL DATA: Neck pain for 3 months or more. Left more than right
neck pain extending into the shoulder and back

EXAM:
CT CERVICAL SPINE WITHOUT CONTRAST
TECHNIQUE: Multidetector CT imaging of the cervical spine was performed without
intravenous contrast. Multiplanar CT image reconstructions were also
generated.

[Series 2: c-spine 2.00 br60 s3 axial bone · axial · 0.27mm/px · z∈[-657,-551]mm · 3 of 107 slices shown, 4 images]
[im 27/107  soft-tissue]
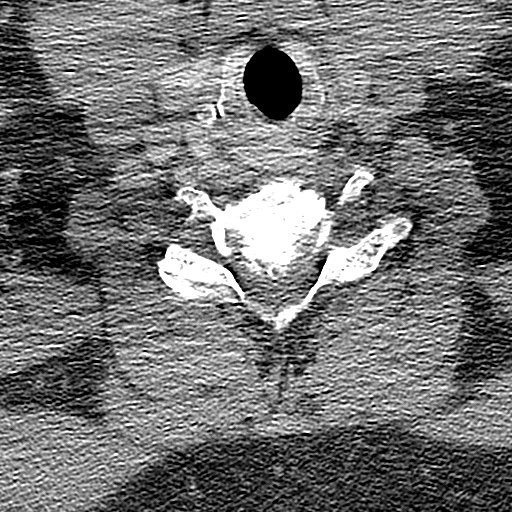
[im 27/107  bone]
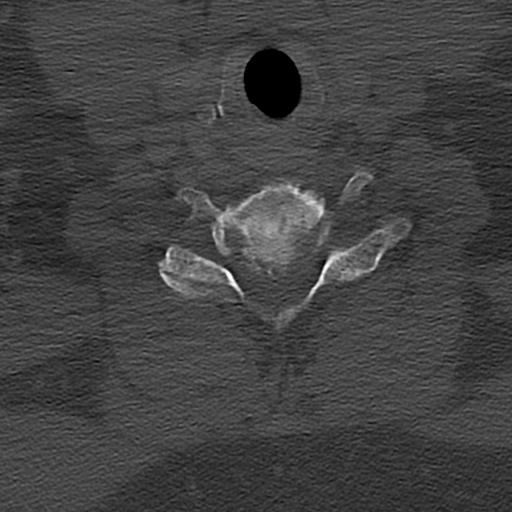
[im 54/107  bone]
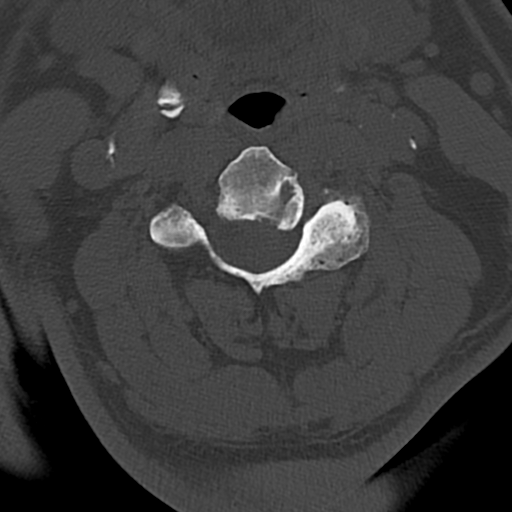
[im 80/107  bone]
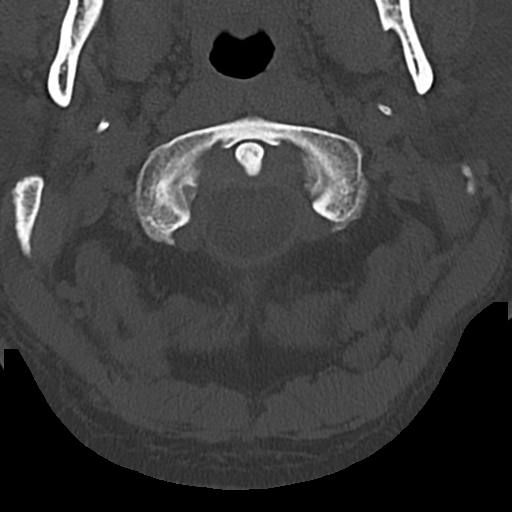

[Series 3: c-spine 2.00 br40 s3 axial (person_name) · axial · 0.27mm/px · z∈[-657,-603]mm · 2 of 107 slices shown]
[im 27/107  bone]
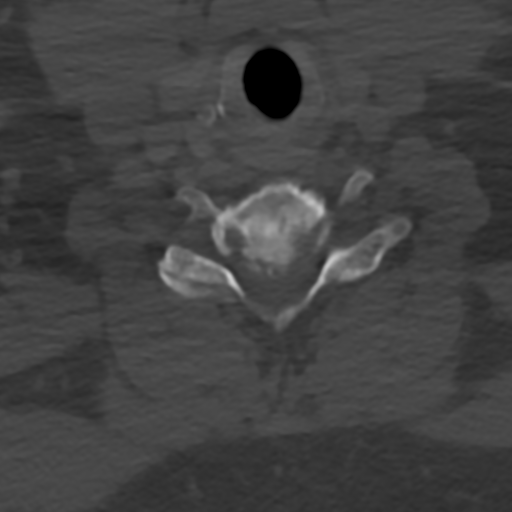
[im 54/107  bone]
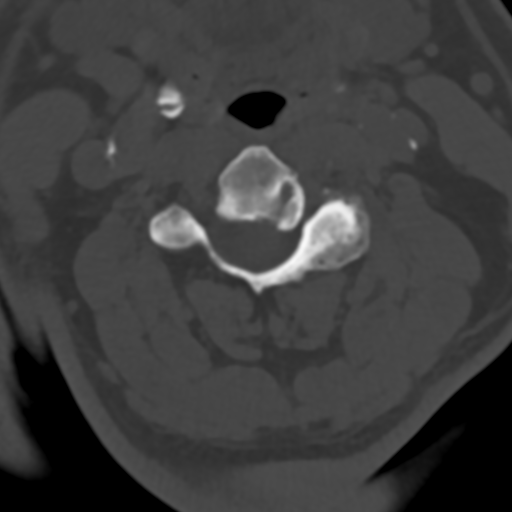

[Series 4: c-spine 2.00 br60 s3 sag bone · sagittal · 0.27mm/px · 5 of 78 slices shown, 6 images]
[im 26/78  bone]
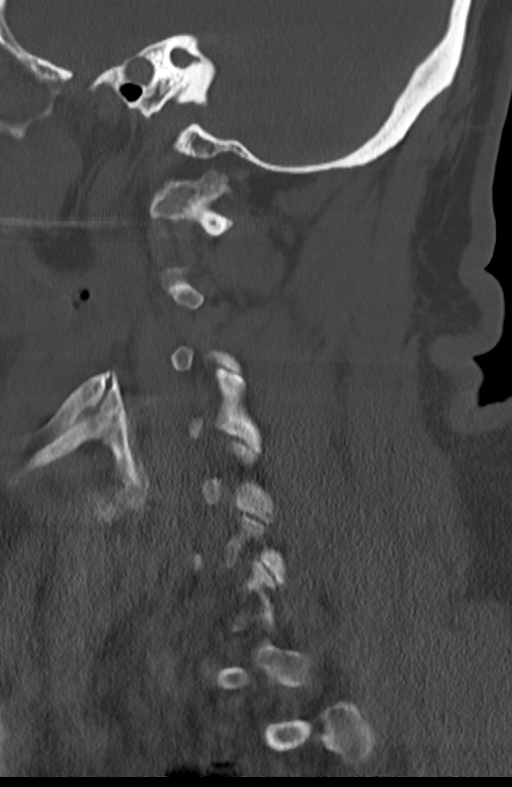
[im 33/78  bone]
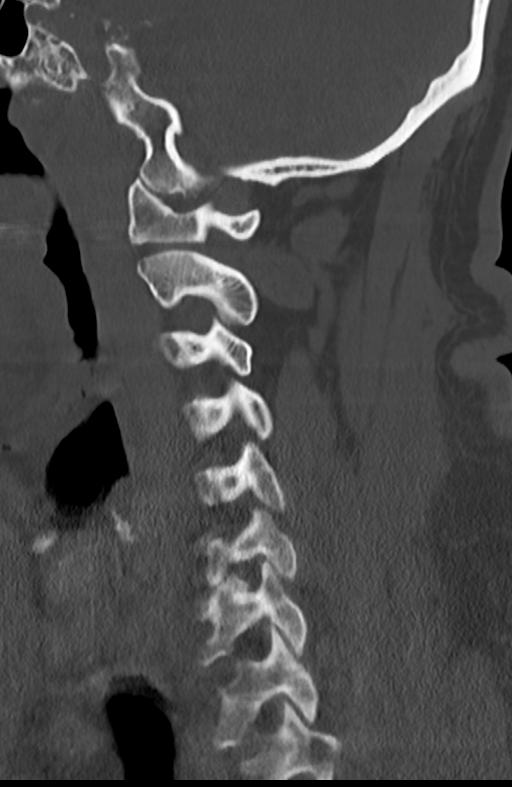
[im 39/78  soft-tissue]
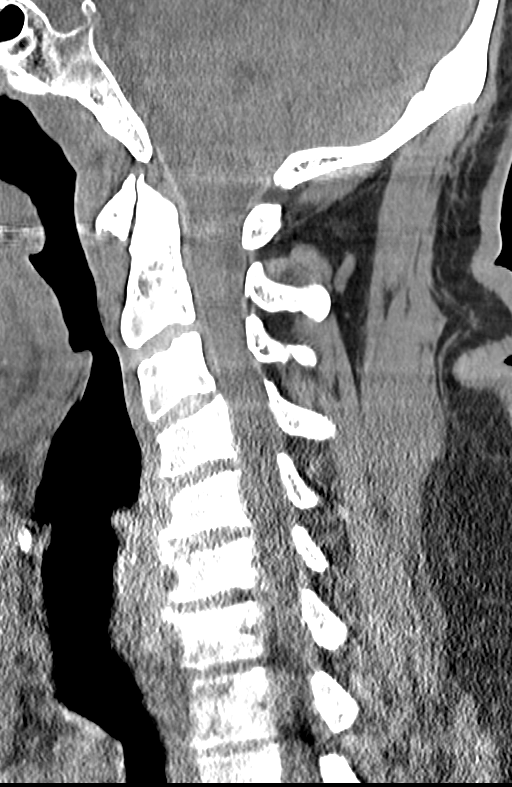
[im 39/78  bone]
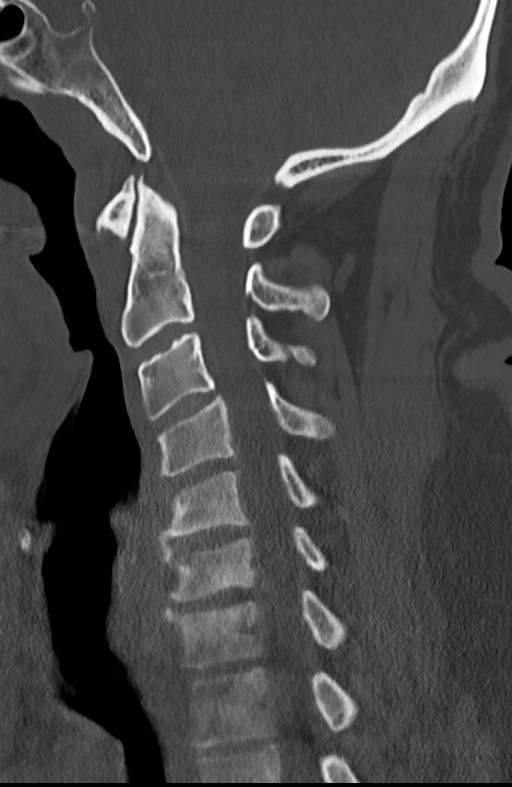
[im 45/78  bone]
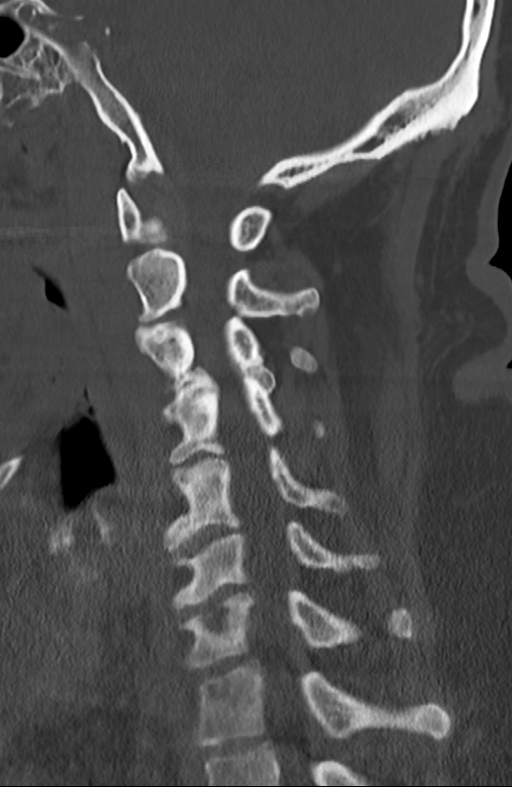
[im 52/78  bone]
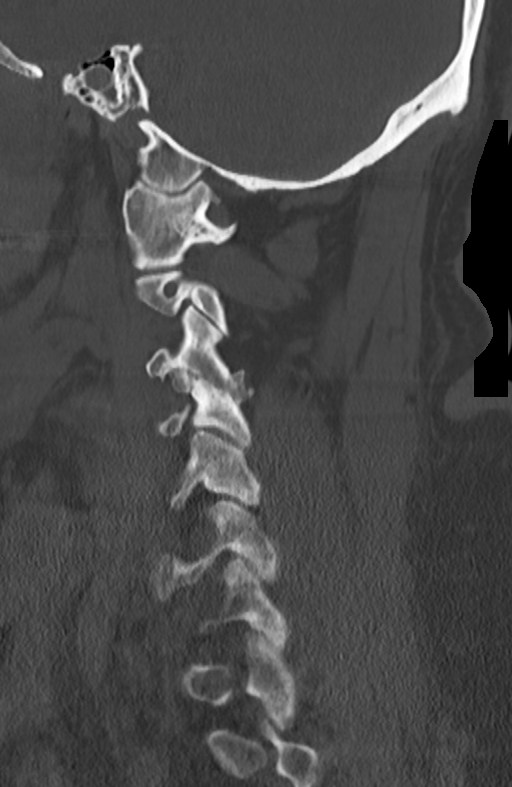

[Series 6: c-spine 2.00 hr60 s3 cor bone · coronal · 0.31mm/px · 3 of 69 slices shown]
[im 14/69  bone]
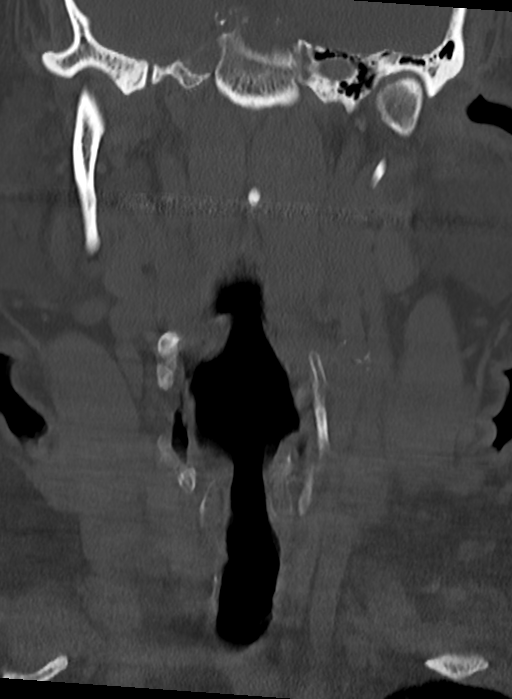
[im 28/69  bone]
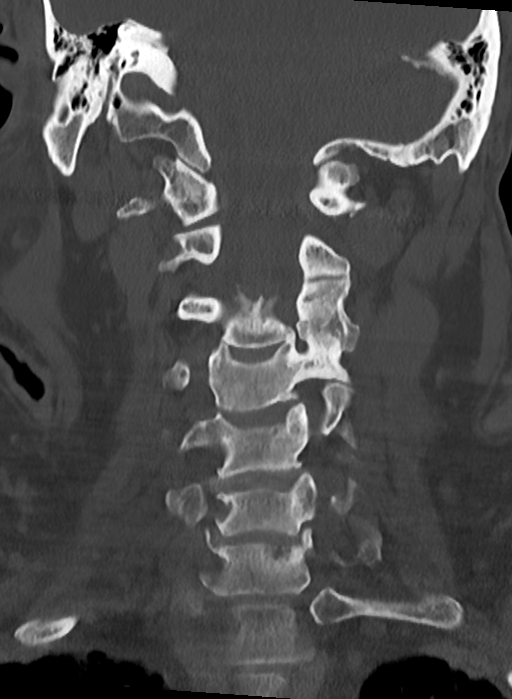
[im 41/69  bone]
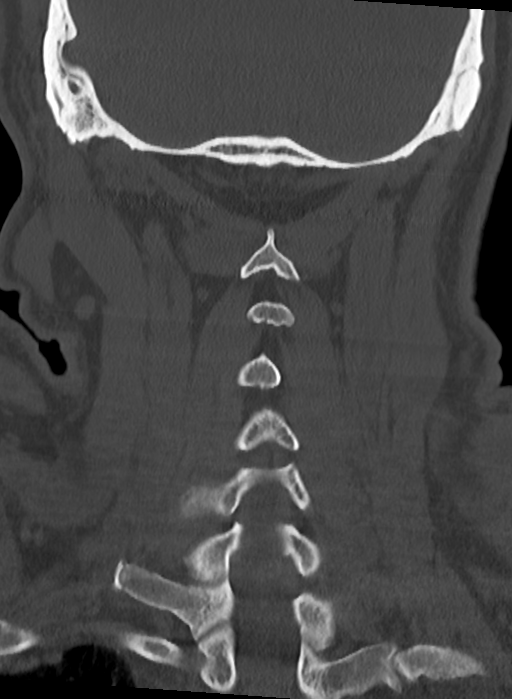

[13 of 35 positions shown; findings below may reference images not displayed]

FINDINGS: Alignment: Reversal of cervical lordosis

Skull base and vertebrae: Negative for fracture or bone lesion

Soft tissues and spinal canal: No evidence of mass or inflammation

Disc levels:

C2-3: Unremarkable.

C3-4: Bulky left-sided facet spurring with ankylosis. Left
uncovertebral ridging. Left foraminal stenosis is severe. Patent
spinal canal

C4-5: Disc narrowing with bulging of the partially calcified
annulus. Negative facets. No visible impingement

C5-6: Spondylosis with mild disc narrowing. No evidence of
impingement

C6-7: Spondylosis with disc narrowing and annulus calcification.
Interval but chronic appearing Schmorl's node in the C7 superior
endplate. Negative facets. Patent canal and foramina

C7-T1:Mild narrowing of the disc. Ligamentum flavum ossification. No
visible impingement

Upper chest: Negative
IMPRESSION: 1. Focal severe left facet arthropathy at C3-4 with interval
ankylosis. Combined with uncovertebral ridging there is severe left
foraminal stenosis at this level that was also seen in 4345.
2. Disc degeneration is largely stable from 4345. The canal appears
diffusely patent.

## 2020-03-07 ENCOUNTER — Ambulatory Visit (INDEPENDENT_AMBULATORY_CARE_PROVIDER_SITE_OTHER): Payer: Medicare Other | Admitting: Podiatry

## 2020-03-07 ENCOUNTER — Encounter: Payer: Self-pay | Admitting: Podiatry

## 2020-03-07 ENCOUNTER — Other Ambulatory Visit: Payer: Self-pay

## 2020-03-07 DIAGNOSIS — M2141 Flat foot [pes planus] (acquired), right foot: Secondary | ICD-10-CM | POA: Diagnosis not present

## 2020-03-07 DIAGNOSIS — M205X2 Other deformities of toe(s) (acquired), left foot: Secondary | ICD-10-CM

## 2020-03-07 DIAGNOSIS — E1151 Type 2 diabetes mellitus with diabetic peripheral angiopathy without gangrene: Secondary | ICD-10-CM

## 2020-03-07 DIAGNOSIS — M2142 Flat foot [pes planus] (acquired), left foot: Secondary | ICD-10-CM | POA: Diagnosis not present

## 2020-03-07 NOTE — Progress Notes (Signed)
This patient returns to my office for at risk foot care.  This patient requires this care by a professional since this patient will be at risk due to having diabetic mellitus secondary, uncontrolled with peripheral vascular complications. This patient presents to the office for a diabetic foot exam and new diabetic shoes.Patient is taking gabapentin.  This patient presents for at risk foot care today.  General Appearance  Alert, conversant and in no acute stress.  Vascular  Dorsalis pedis and posterior tibial  pulses are palpable  bilaterally.  Capillary return is within normal limits  bilaterally. Temperature is within normal limits  bilaterally.  Neurologic  Senn-Weinstein monofilament wire diminished   bilaterally. Muscle power within normal limits bilaterally.  Nails Normal nails noted with infection or drainage.. No evidence of bacterial infection or drainage bilaterally.  Orthopedic  No limitations of motion  feet .  No crepitus or effusions noted.  No bony pathology or digital deformities noted.  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  Pes planus  B/L.  Skin  normotropic skin with no porokeratosis noted bilaterally.  No signs of infections or ulcers noted.     Diabetic mellitus with neuropathy.   Consent was obtained for treatment procedures.   Diabetic exam performed with no vascular or neurologic pathology.  Patient qualifies for diabetic shoes due to DPN and HAV  B/L and pes planus  B/L.  Patient has an appointment with Raiford Noble in one week.   Return office visit   prn                  Told patient to return for periodic foot care and evaluation due to potential at risk complications.   Helane Gunther DPM

## 2020-03-13 ENCOUNTER — Ambulatory Visit: Payer: Medicare Other | Admitting: Orthotics

## 2020-03-20 ENCOUNTER — Ambulatory Visit: Payer: Medicare Other | Admitting: Orthotics

## 2020-03-27 ENCOUNTER — Other Ambulatory Visit: Payer: Self-pay

## 2020-03-27 ENCOUNTER — Ambulatory Visit: Payer: Medicare Other | Admitting: Orthotics

## 2020-03-27 DIAGNOSIS — M2142 Flat foot [pes planus] (acquired), left foot: Secondary | ICD-10-CM

## 2020-03-27 DIAGNOSIS — M205X2 Other deformities of toe(s) (acquired), left foot: Secondary | ICD-10-CM

## 2020-03-27 DIAGNOSIS — M2141 Flat foot [pes planus] (acquired), right foot: Secondary | ICD-10-CM

## 2020-03-27 DIAGNOSIS — E1151 Type 2 diabetes mellitus with diabetic peripheral angiopathy without gangrene: Secondary | ICD-10-CM

## 2020-03-27 NOTE — Progress Notes (Signed)
Patient seen today for DBS; however, he ONLY wants the DBS.  Patient claims he  Has plenty of inserts left over from previoulsy.    He is being seen by NP, but beliees Der. Fatima Sanger will sign off on them.

## 2020-04-25 ENCOUNTER — Other Ambulatory Visit: Payer: Self-pay

## 2020-04-25 ENCOUNTER — Ambulatory Visit (INDEPENDENT_AMBULATORY_CARE_PROVIDER_SITE_OTHER): Payer: Medicare Other | Admitting: *Deleted

## 2020-04-25 ENCOUNTER — Other Ambulatory Visit: Payer: Medicare Other

## 2020-04-25 DIAGNOSIS — M2141 Flat foot [pes planus] (acquired), right foot: Secondary | ICD-10-CM

## 2020-04-25 DIAGNOSIS — E1151 Type 2 diabetes mellitus with diabetic peripheral angiopathy without gangrene: Secondary | ICD-10-CM

## 2020-04-25 DIAGNOSIS — M205X2 Other deformities of toe(s) (acquired), left foot: Secondary | ICD-10-CM | POA: Diagnosis not present

## 2020-04-25 DIAGNOSIS — M2142 Flat foot [pes planus] (acquired), left foot: Secondary | ICD-10-CM | POA: Diagnosis not present

## 2020-04-25 NOTE — Progress Notes (Signed)
Patient presents today to pick up diabetic shoes.  Patient was dispensed 1 pair of diabetic shoes only. He says he still has inserts at home and wasn't needed. Fit was satisfactory. Instructions for break-in and wear was reviewed and a copy was given to the patient.   Medicare and medicaid ABN were signed.  Re-appointment for regularly scheduled diabetic foot care visits or if they should experience any trouble with the shoes or insoles.

## 2020-05-09 ENCOUNTER — Ambulatory Visit: Payer: Medicare Other | Admitting: Podiatry

## 2020-05-20 ENCOUNTER — Other Ambulatory Visit: Payer: Self-pay | Admitting: Internal Medicine

## 2020-06-13 ENCOUNTER — Encounter (INDEPENDENT_AMBULATORY_CARE_PROVIDER_SITE_OTHER): Payer: Self-pay | Admitting: Otolaryngology

## 2020-06-13 ENCOUNTER — Ambulatory Visit (INDEPENDENT_AMBULATORY_CARE_PROVIDER_SITE_OTHER): Payer: Medicare Other | Admitting: Otolaryngology

## 2020-06-13 ENCOUNTER — Other Ambulatory Visit: Payer: Self-pay

## 2020-06-13 VITALS — Temp 97.5°F

## 2020-06-13 DIAGNOSIS — J342 Deviated nasal septum: Secondary | ICD-10-CM

## 2020-06-13 DIAGNOSIS — J3489 Other specified disorders of nose and nasal sinuses: Secondary | ICD-10-CM | POA: Diagnosis not present

## 2020-06-13 NOTE — Progress Notes (Signed)
HPI: Christian Bowen is a 60 y.o. male who returns today for evaluation of chronic left-sided nasal obstruction.  Patient has obstructive sleep apnea and uses nasal CPAP.  He has difficulty using this because of left-sided nasal obstruction.  He has tried intranasal stents that helped a little bit but causes his nose to be sore.  He had previous septoplasty and UPPP surgery by myself performed in 2013.Marland Kitchen  Past Medical History:  Diagnosis Date  . Apnea, sleep    was tested 2005  . Asthma   . Diabetes mellitus   . Hyperlipemia 10/12/2013  . Hypertension   . Lumbar post-laminectomy syndrome 04/02/2013  . Obesity   . Pain in joint, shoulder region 10/12/2013  . Sleep apnea    CPAP-has had UPPP-tonsils and turb red  . Tobacco abuse 03/13/2013   Past Surgical History:  Procedure Laterality Date  . BACK SURGERY  2009  . DORSAL COMPARTMENT RELEASE Left 08/13/2012   Procedure: RELEASE DORSAL COMPARTMENT (DEQUERVAIN) and exploration of radial nerve branch;  Surgeon: Tami Ribas, MD;  Location: [facility];  Service: Orthopedics;  Laterality: Left;  . GANGLION CYST EXCISION  2/14-SCG   lt wrist   . GANGLION CYST EXCISION Left 08/13/2012   Procedure: REMOVAL GANGLION OF WRIST;  Surgeon: Tami Ribas, MD;  Location: [facility];  Service: Orthopedics;  Laterality: Left;  . heel spurs     bilateral feel  . NASAL TURBINATE REDUCTION  8/13   septoplasty-UPPP-tonsils  . SHOULDER ARTHROSCOPY Right 07/02/2013   Procedure: RIGHT SHOULDER ARTHROSCOPY WITH EXTENSIVE DEBRIDEMENT OF ROTATOR CUFF AND LABRUM, SUBACROMIAL DECOMPRESSION, PARTIAL ACROMIOPLASTY WITH CORACOACROMIAL RELEASE;  Surgeon: Thera Flake., MD;  Location: [facility];  Service: Orthopedics;  Laterality: Right;  . SHOULDER ARTHROSCOPY WITH BICEPSTENOTOMY Right 04/15/2014   Procedure: SHOULDER ARTHROSCOPY WITH BICEPSTENOTOMY;  Surgeon: Frederico Hamman, MD;  Location: [facility];  Service:  Orthopedics;  Laterality: Right;  . SPINAL CORD STIMULATOR IMPLANT  1997  . SPINAL CORD STIMULATOR INSERTION N/A 04/02/2013   Procedure: LUMBAR SPINAL CORD STIMULATOR Insertion;  Surgeon: Gwynne Edinger, MD;  Location: [facility];  Service: Neurosurgery;  Laterality: N/A;  . TONSILLECTOMY     Social History   Socioeconomic History  . Marital status: Single    Spouse name: Not on file  . Number of children: Not on file  . Years of education: Not on file  . Highest education level: Not on file  Occupational History  . Occupation: Disabled  Tobacco Use  . Smoking status: Current Some Day Smoker    Packs/day: 0.25    Years: 30.00    Pack years: 7.50    Types: Cigarettes  . Smokeless tobacco: Never Used  . Tobacco comment: 1 pack will last him 2 weeks   Substance and Sexual Activity  . Alcohol use: Yes    Alcohol/week: 0.0 standard drinks    Comment: 1 pint over the weekend...its not all the time  . Drug use: Yes    Types: Marijuana    Comment: none for 1 yr.-10/2012  . Sexual activity: Not on file    Comment: occ-1/2 pk week  Other Topics Concern  . Not on file  Social History Narrative   Lives alone.    Disabled   Social Determinants of Health   Financial Resource Strain: Not on file  Food Insecurity: Not on file  Transportation Needs: Not on file  Physical Activity: Not on file  Stress: Not on file  Social Connections: Not on file   Family History  Problem Relation Age of Onset  . Hypertension Mother   . Heart attack Father    Allergies  Allergen Reactions  . Hydromorphone Hcl     Patient went into respiratory distress   Prior to Admission medications   Medication Sig Start Date End Date Taking? Authorizing Provider  albuterol (PROVENTIL) (2.5 MG/3ML) 0.083% nebulizer solution INHALE CONTENTS OF 1 VIAL IN NEBULIZER EVERY 6 HOURS IF NEEDED FOR WHEEZING OR SHORTNESS OF BREATH 05/13/19   Jetty Duhamel D, MD  atorvastatin (LIPITOR) 40 MG tablet Take 40 mg by  mouth daily.    [provider]  fluticasone furoate-vilanterol (BREO ELLIPTA) 100-25 MCG/INH AEPB INHALE 1 PUFF BY MOUTH EVERY DAY. RINSE MOUTH AFTER USE 05/20/20   Jetty Duhamel D, MD  gabapentin (NEURONTIN) 300 MG capsule Take 300 mg by mouth daily. 02/01/19   [provider]  lisinopril-hydrochlorothiazide (PRINZIDE,ZESTORETIC) 20-25 MG per tablet Take 1 tablet by mouth daily.    [provider]  oxyCODONE-acetaminophen (PERCOCET) 10-325 MG tablet once as needed. 04/20/19   [provider]  PROAIR HFA 108 (90 Base) MCG/ACT inhaler INHALE 2 PUFFS EVERY 6 HOURS 05/13/19   Jetty Duhamel D, MD  sitaGLIPtan-metformin (JANUMET) 50-1000 MG per tablet Take 1 tablet by mouth 2 (two) times daily with a meal.    [provider]  TRULICITY 1.5 MG/0.5ML SOPN  12/28/18   [provider]     Positive ROS: Otherwise negative  All other systems have been reviewed and were otherwise negative with the exception of those mentioned in the HPI and as above.  Physical Exam: Constitutional: Alert, well-appearing, no acute distress Ears: External ears without lesions or tenderness. Ear canals are clear bilaterally with intact, clear TMs.  Nasal: External nose without lesions. Septum is deviated anteriorly to the left side with a very narrowed left nasal opening compared to the right side.  He also has a tendency for nasal valve collapse.  He has moderate size inferior turbinates.  No intranasal lesions noted otherwise on nasal endoscopy..  Oral: Lips and gums without lesions. Tongue and palate mucosa without lesions. Posterior oropharynx clear. Neck: No palpable adenopathy or masses Respiratory: Breathing comfortably  Skin: No facial/neck lesions or rash noted.  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  Plan: Reviewed with him concerning revision septoplasty and left vestibuloplasty to help to support the nasal valve  to enlarge the left nasal opening and improve his breathing on the left side.  On his initial surgery in 2013 the septum was more deviated to the left.   Narda Bonds, MD

## 2020-08-25 ENCOUNTER — Ambulatory Visit (INDEPENDENT_AMBULATORY_CARE_PROVIDER_SITE_OTHER): Payer: Self-pay | Admitting: Otolaryngology

## 2020-08-25 DIAGNOSIS — J3489 Other specified disorders of nose and nasal sinuses: Secondary | ICD-10-CM

## 2020-08-25 NOTE — H&P (View-Only) (Signed)
PREOPERATIVE H&P  Chief Complaint: Left-sided nasal obstruction  HPI: Christian Bowen is a 60 y.o. male who presents for evaluation of left-sided nasal obstruction.  Patient has history of obstructive sleep apnea and uses nasal CPAP.  He has much more difficulty breathing than the left side of his nose.  He is status post previous surgery for obstructive sleep apnea in 2013 that included UPPP as well as septoplasty and turbinate reductions.  On recent exam in the office he still has moderate septal deflection to the left with a tendency for left nasal valve collapse.  He is taken to the operating room at this time for revision septoplasty, turbinate reductions and possible left vestibuloplasty.  Past Medical History:  Diagnosis Date   Apnea, sleep    was tested 2005   Asthma    Diabetes mellitus    Hyperlipemia 10/12/2013   Hypertension    Lumbar post-laminectomy syndrome 04/02/2013   Obesity    Pain in joint, shoulder region 10/12/2013   Sleep apnea    CPAP-has had UPPP-tonsils and turb red   Tobacco abuse 03/13/2013   Past Surgical History:  Procedure Laterality Date   BACK SURGERY  2009   DORSAL COMPARTMENT RELEASE Left 08/13/2012   Procedure: RELEASE DORSAL COMPARTMENT (DEQUERVAIN) and exploration of radial nerve branch;  Surgeon: Tami Ribas, MD;  Location: [facility];  Service: Orthopedics;  Laterality: Left;   GANGLION CYST EXCISION  2/14-SCG   lt wrist    GANGLION CYST EXCISION Left 08/13/2012   Procedure: REMOVAL GANGLION OF WRIST;  Surgeon: Tami Ribas, MD;  Location: [facility];  Service: Orthopedics;  Laterality: Left;   heel spurs     bilateral feel   NASAL TURBINATE REDUCTION  8/13   septoplasty-UPPP-tonsils   SHOULDER ARTHROSCOPY Right 07/02/2013   Procedure: RIGHT SHOULDER ARTHROSCOPY WITH EXTENSIVE DEBRIDEMENT OF ROTATOR CUFF AND LABRUM, SUBACROMIAL DECOMPRESSION, PARTIAL ACROMIOPLASTY WITH CORACOACROMIAL RELEASE;  Surgeon: Thera Flake.,  MD;  Location: [facility];  Service: Orthopedics;  Laterality: Right;   SHOULDER ARTHROSCOPY WITH BICEPSTENOTOMY Right 04/15/2014   Procedure: SHOULDER ARTHROSCOPY WITH BICEPSTENOTOMY;  Surgeon: Frederico Hamman, MD;  Location: [facility];  Service: Orthopedics;  Laterality: Right;   SPINAL CORD STIMULATOR IMPLANT  1997   SPINAL CORD STIMULATOR INSERTION N/A 04/02/2013   Procedure: LUMBAR SPINAL CORD STIMULATOR Insertion;  Surgeon: Gwynne Edinger, MD;  Location: [facility];  Service: Neurosurgery;  Laterality: N/A;   TONSILLECTOMY     Social History   Socioeconomic History   Marital status: Single    Spouse name: Not on file   Number of children: Not on file   Years of education: Not on file   Highest education level: Not on file  Occupational History   Occupation: Disabled  Tobacco Use   Smoking status: Some Days    Packs/day: 0.25    Years: 30.00    Pack years: 7.50    Types: Cigarettes   Smokeless tobacco: Never   Tobacco comments:    1 pack will last him 2 weeks   Substance and Sexual Activity   Alcohol use: Yes    Alcohol/week: 0.0 standard drinks    Comment: 1 pint over the weekend...its not all the time   Drug use: Yes    Types: Marijuana    Comment: none for 1 yr.-10/2012   Sexual activity: Not on file    Comment: occ-1/2 pk week  Other Topics Concern  Not on file  Social History Narrative   Lives alone.    Disabled   Social Determinants of Health   Financial Resource Strain: Not on file  Food Insecurity: Not on file  Transportation Needs: Not on file  Physical Activity: Not on file  Stress: Not on file  Social Connections: Not on file   Family History  Problem Relation Age of Onset   Hypertension Mother    Heart attack Father    Allergies  Allergen Reactions   Hydromorphone Hcl Shortness Of Breath    Patient went into respiratory distress   Prior to Admission medications   Medication Sig Start Date End Date Taking?  Authorizing Provider  albuterol (PROVENTIL) (2.5 MG/3ML) 0.083% nebulizer solution INHALE CONTENTS OF 1 VIAL IN NEBULIZER EVERY 6 HOURS IF NEEDED FOR WHEEZING OR SHORTNESS OF BREATH Patient taking differently: Take 2.5 mg by nebulization every 6 (six) hours as needed for wheezing or shortness of breath. 05/13/19   Jetty Duhamel D, MD  atorvastatin (LIPITOR) 40 MG tablet Take 40 mg by mouth daily.    [provider]  celecoxib (CELEBREX) 200 MG capsule Take 200 mg by mouth daily.    [provider]  fluticasone furoate-vilanterol (BREO ELLIPTA) 100-25 MCG/INH AEPB INHALE 1 PUFF BY MOUTH EVERY DAY. RINSE MOUTH AFTER USE Patient taking differently: Inhale 1 puff into the lungs daily as needed (shortness of breath). 05/20/20   Waymon Budge, MD  gabapentin (NEURONTIN) 300 MG capsule Take 300 mg by mouth daily as needed (pain). 02/01/19   [provider]  glipiZIDE (GLUCOTROL) 10 MG tablet Take 10 mg by mouth daily before breakfast.    [provider]  HYDROcodone-acetaminophen (NORCO) 7.5-325 MG tablet Take 1 tablet by mouth 3 (three) times daily as needed for pain. 08/22/20   [provider]  lisinopril (ZESTRIL) 20 MG tablet Take 20 mg by mouth daily. 07/10/20   [provider]  PROAIR HFA 108 (90 Base) MCG/ACT inhaler INHALE 2 PUFFS EVERY 6 HOURS Patient taking differently: No sig reported 05/13/19   Jetty Duhamel D, MD  sitaGLIPtan-metformin (JANUMET) 50-1000 MG per tablet Take 1 tablet by mouth 2 (two) times daily with a meal.    [provider]  TRULICITY 1.5 MG/0.5ML SOPN Inject 1.5 mg into the skin every Saturday. 12/28/18   [provider]  Vitamin D, Ergocalciferol, (DRISDOL) 1.25 MG (50000 UNIT) CAPS capsule Take 50,000 Units by mouth once a week. 07/14/20   [provider]     Positive ROS: Otherwise negative  All other systems have been reviewed and were otherwise negative with the exception of those mentioned  in the HPI and as above.  Physical Exam: There were no vitals filed for this visit.  General: Alert, no acute distress Oral: Normal oral mucosa.  Patient is status post UPPP but normal-appearing oral mucosa. Nasal: Septum is moderately deviated to the left with a narrowed left nasal valve region.  No intranasal polyps noted. Neck: No palpable adenopathy or thyroid nodules Ear: Ear canal is clear with normal appearing TMs Cardiovascular: Regular rate and rhythm, no murmur.  Respiratory: Clear to auscultation Neurologic: Alert and oriented x 3   Assessment/Plan: Left-sided nasal obstruction secondary to septal deviation and nasal valve collapse.  Plan for septoplasty and turbinate reductions with possible left nasal vestibuloplasty.   Dillard Cannon, MD 08/25/2020 1:45 PM

## 2020-08-25 NOTE — H&P (Signed)
PREOPERATIVE H&P  Chief Complaint: Left-sided nasal obstruction  HPI: Christian Bowen is a 60 y.o. male who presents for evaluation of left-sided nasal obstruction.  Patient has history of obstructive sleep apnea and uses nasal CPAP.  He has much more difficulty breathing than the left side of his nose.  He is status post previous surgery for obstructive sleep apnea in 2013 that included UPPP as well as septoplasty and turbinate reductions.  On recent exam in the office he still has moderate septal deflection to the left with a tendency for left nasal valve collapse.  He is taken to the operating room at this time for revision septoplasty, turbinate reductions and possible left vestibuloplasty.  Past Medical History:  Diagnosis Date   Apnea, sleep    was tested 2005   Asthma    Diabetes mellitus    Hyperlipemia 10/12/2013   Hypertension    Lumbar post-laminectomy syndrome 04/02/2013   Obesity    Pain in joint, shoulder region 10/12/2013   Sleep apnea    CPAP-has had UPPP-tonsils and turb red   Tobacco abuse 03/13/2013   Past Surgical History:  Procedure Laterality Date   BACK SURGERY  2009   DORSAL COMPARTMENT RELEASE Left 08/13/2012   Procedure: RELEASE DORSAL COMPARTMENT (DEQUERVAIN) and exploration of radial nerve branch;  Surgeon: Tami Ribas, MD;  Location: [facility];  Service: Orthopedics;  Laterality: Left;   GANGLION CYST EXCISION  2/14-SCG   lt wrist    GANGLION CYST EXCISION Left 08/13/2012   Procedure: REMOVAL GANGLION OF WRIST;  Surgeon: Tami Ribas, MD;  Location: [facility];  Service: Orthopedics;  Laterality: Left;   heel spurs     bilateral feel   NASAL TURBINATE REDUCTION  8/13   septoplasty-UPPP-tonsils   SHOULDER ARTHROSCOPY Right 07/02/2013   Procedure: RIGHT SHOULDER ARTHROSCOPY WITH EXTENSIVE DEBRIDEMENT OF ROTATOR CUFF AND LABRUM, SUBACROMIAL DECOMPRESSION, PARTIAL ACROMIOPLASTY WITH CORACOACROMIAL RELEASE;  Surgeon: Thera Flake.,  MD;  Location: [facility];  Service: Orthopedics;  Laterality: Right;   SHOULDER ARTHROSCOPY WITH BICEPSTENOTOMY Right 04/15/2014   Procedure: SHOULDER ARTHROSCOPY WITH BICEPSTENOTOMY;  Surgeon: Frederico Hamman, MD;  Location: [facility];  Service: Orthopedics;  Laterality: Right;   SPINAL CORD STIMULATOR IMPLANT  1997   SPINAL CORD STIMULATOR INSERTION N/A 04/02/2013   Procedure: LUMBAR SPINAL CORD STIMULATOR Insertion;  Surgeon: Gwynne Edinger, MD;  Location: [facility];  Service: Neurosurgery;  Laterality: N/A;   TONSILLECTOMY     Social History   Socioeconomic History   Marital status: Single    Spouse name: Not on file   Number of children: Not on file   Years of education: Not on file   Highest education level: Not on file  Occupational History   Occupation: Disabled  Tobacco Use   Smoking status: Some Days    Packs/day: 0.25    Years: 30.00    Pack years: 7.50    Types: Cigarettes   Smokeless tobacco: Never   Tobacco comments:    1 pack will last him 2 weeks   Substance and Sexual Activity   Alcohol use: Yes    Alcohol/week: 0.0 standard drinks    Comment: 1 pint over the weekend...its not all the time   Drug use: Yes    Types: Marijuana    Comment: none for 1 yr.-10/2012   Sexual activity: Not on file    Comment: occ-1/2 pk week  Other Topics Concern  Not on file  Social History Narrative   Lives alone.    Disabled   Social Determinants of Health   Financial Resource Strain: Not on file  Food Insecurity: Not on file  Transportation Needs: Not on file  Physical Activity: Not on file  Stress: Not on file  Social Connections: Not on file   Family History  Problem Relation Age of Onset   Hypertension Mother    Heart attack Father    Allergies  Allergen Reactions   Hydromorphone Hcl Shortness Of Breath    Patient went into respiratory distress   Prior to Admission medications   Medication Sig Start Date End Date Taking?  Authorizing Provider  albuterol (PROVENTIL) (2.5 MG/3ML) 0.083% nebulizer solution INHALE CONTENTS OF 1 VIAL IN NEBULIZER EVERY 6 HOURS IF NEEDED FOR WHEEZING OR SHORTNESS OF BREATH Patient taking differently: Take 2.5 mg by nebulization every 6 (six) hours as needed for wheezing or shortness of breath. 05/13/19   Young, Clinton D, MD  atorvastatin (LIPITOR) 40 MG tablet Take 40 mg by mouth daily.    [provider]  celecoxib (CELEBREX) 200 MG capsule Take 200 mg by mouth daily.    [provider]  fluticasone furoate-vilanterol (BREO ELLIPTA) 100-25 MCG/INH AEPB INHALE 1 PUFF BY MOUTH EVERY DAY. RINSE MOUTH AFTER USE Patient taking differently: Inhale 1 puff into the lungs daily as needed (shortness of breath). 05/20/20   Young, Clinton D, MD  gabapentin (NEURONTIN) 300 MG capsule Take 300 mg by mouth daily as needed (pain). 02/01/19   [provider]  glipiZIDE (GLUCOTROL) 10 MG tablet Take 10 mg by mouth daily before breakfast.    [provider]  HYDROcodone-acetaminophen (NORCO) 7.5-325 MG tablet Take 1 tablet by mouth 3 (three) times daily as needed for pain. 08/22/20   [provider]  lisinopril (ZESTRIL) 20 MG tablet Take 20 mg by mouth daily. 07/10/20   [provider]  PROAIR HFA 108 (90 Base) MCG/ACT inhaler INHALE 2 PUFFS EVERY 6 HOURS Patient taking differently: No sig reported 05/13/19   Young, Clinton D, MD  sitaGLIPtan-metformin (JANUMET) 50-1000 MG per tablet Take 1 tablet by mouth 2 (two) times daily with a meal.    [provider]  TRULICITY 1.5 MG/0.5ML SOPN Inject 1.5 mg into the skin every Saturday. 12/28/18   [provider]  Vitamin D, Ergocalciferol, (DRISDOL) 1.25 MG (50000 UNIT) CAPS capsule Take 50,000 Units by mouth once a week. 07/14/20   [provider]     Positive ROS: Otherwise negative  All other systems have been reviewed and were otherwise negative with the exception of those mentioned  in the HPI and as above.  Physical Exam: There were no vitals filed for this visit.  General: Alert, no acute distress Oral: Normal oral mucosa.  Patient is status post UPPP but normal-appearing oral mucosa. Nasal: Septum is moderately deviated to the left with a narrowed left nasal valve region.  No intranasal polyps noted. Neck: No palpable adenopathy or thyroid nodules Ear: Ear canal is clear with normal appearing TMs Cardiovascular: Regular rate and rhythm, no murmur.  Respiratory: Clear to auscultation Neurologic: Alert and oriented x 3   Assessment/Plan: Left-sided nasal obstruction secondary to septal deviation and nasal valve collapse.  Plan for septoplasty and turbinate reductions with possible left nasal vestibuloplasty.   Dadrian Ballantine, MD 08/25/2020 1:45 PM   

## 2020-08-25 NOTE — Progress Notes (Signed)
Surgical Instructions    Your procedure is scheduled on August 30, 2020.  Report to [facility] Entrance "A" at 10:45 A.M., then check in with the Admitting office.  Call this number if you have problems the morning of surgery:  [phone]   If you have any questions prior to your surgery date call [phone]: Open Monday-Friday 8am-4pm    Remember:  Do not eat after midnight the night before your surgery  You may drink clear liquids until 09:45am the morning of your surgery.   Clear liquids allowed are: Water, Non-Citrus Juices (without pulp), Carbonated Beverages, Clear Tea, Black Coffee Only, and Gatorade    Take these medicines the morning of surgery with A SIP OF WATER : Atorvastatin (Lipitor) Fluticason Furoate-Vilanterol (Breo Ellipta)--Please bring all inhalers with you the day of surgery.    If needed: Albuterol (Proventil) nebulizer Gabapentin (Neurontin) Hydrocodone-acetaminophen (Norco) Proair HFA inhaler  As of today, STOP taking any Aspirin (unless otherwise instructed by your surgeon) Celecoxib (CELEBREX), Aleve, Naproxen, Ibuprofen, Motrin, Advil, Goody's, BC's, all herbal medications, fish oil, and all vitamins.  WHAT DO I DO ABOUT MY DIABETES MEDICATION?   Do not take oral diabetes medicines (pills) the morning of surgery. DO NOT TAKE SITAGLIPTAN-METFORMIN (JANUMET) OR GLIPIZIDE (GLUCOTROL)   The day of surgery, do not take other diabetes injectables, including Byetta (exenatide), Bydureon (exenatide ER), Victoza (liraglutide), or Trulicity (dulaglutide).   HOW TO MANAGE YOUR DIABETES BEFORE AND AFTER SURGERY  Why is it important to control my blood sugar before and after surgery? Improving blood sugar levels before and after surgery helps healing and can limit problems. A way of improving blood sugar control is eating a healthy diet by:  Eating less sugar and carbohydrates  Increasing activity/exercise  Talking with your doctor about reaching  your blood sugar goals High blood sugars (greater than 180 mg/dL) can raise your risk of infections and slow your recovery, so you will need to focus on controlling your diabetes during the weeks before surgery. Make sure that the doctor who takes care of your diabetes knows about your planned surgery including the date and location.  How do I manage my blood sugar before surgery? Check your blood sugar at least 4 times a day, starting 2 days before surgery, to make sure that the level is not too high or low.  Check your blood sugar the morning of your surgery when you wake up and every 2 hours until you get to the Short Stay unit.  If your blood sugar is less than 70 mg/dL, you will need to treat for low blood sugar: Do not take insulin. Treat a low blood sugar (less than 70 mg/dL) with  cup of clear juice (cranberry or apple), 4 glucose tablets, OR glucose gel. Recheck blood sugar in 15 minutes after treatment (to make sure it is greater than 70 mg/dL). If your blood sugar is not greater than 70 mg/dL on recheck, call [phone] for further instructions. Report your blood sugar to the short stay nurse when you get to Short Stay.  If you are admitted to the hospital after surgery: Your blood sugar will be checked by the staff and you will probably be given insulin after surgery (instead of oral diabetes medicines) to make sure you have good blood sugar levels. The goal for blood sugar control after surgery is 80-180 mg/dL.                    Do not wear  jewelry  Do not wear lotions, powders, perfumes/colognes, or deodorant. Do not shave 48 hours prior to surgery.  Men may shave face and neck. Do not bring valuables to the hospital.   [facility] is not responsible for any belongings or valuables.  Do NOT Smoke (Tobacco/Vaping) or drink Alcohol 24 hours prior to your procedure If you use a CPAP at night, you may bring all equipment for your overnight stay.   Contacts, glasses,  dentures or bridgework may not be worn into surgery, please bring cases for these belongings   For patients admitted to the hospital, discharge time will be determined by your treatment team.   Patients discharged the day of surgery will not be allowed to drive home, and someone needs to stay with them for 24 hours.  ONLY 1 SUPPORT PERSON MAY BE PRESENT WHILE YOU ARE IN SURGERY. IF YOU ARE TO BE ADMITTED ONCE YOU ARE IN YOUR ROOM YOU WILL BE ALLOWED TWO (2) VISITORS.  Minor children may have two parents present. Special consideration for safety and communication needs will be reviewed on a case by case basis.  Special instructions:   [place]- Preparing For Surgery  Before surgery, you can play an important role. Because skin is not sterile, your skin needs to be as free of germs as possible. You can reduce the number of germs on your skin by washing with CHG (chlorahexidine gluconate) Soap before surgery.  CHG is an antiseptic cleaner which kills germs and bonds with the skin to continue killing germs even after washing.    Oral Hygiene is also important to reduce your risk of infection.  Remember - BRUSH YOUR TEETH THE MORNING OF SURGERY WITH YOUR REGULAR TOOTHPASTE  Please do not use if you have an allergy to CHG or antibacterial soaps. If your skin becomes reddened/irritated stop using the CHG.  Do not shave (including legs and underarms) for at least 48 hours prior to first CHG shower. It is OK to shave your face.  Please follow these instructions carefully.   Shower the NIGHT BEFORE SURGERY and the MORNING OF SURGERY  If you chose to wash your hair, wash your hair first as usual with your normal shampoo.  After you shampoo, rinse your hair and body thoroughly to remove the shampoo.  Wash Face and genitals (private parts) with your normal soap.   Use CHG Soap as you would any other liquid soap. You can apply CHG directly to the skin and wash gently with a scrungie or a clean  washcloth.   Apply the CHG Soap to your body ONLY FROM THE NECK DOWN.  Do not use on open wounds or open sores. Avoid contact with your eyes, ears, mouth and genitals (private parts).   Wash thoroughly, paying special attention to the area where your surgery will be performed.  Thoroughly rinse your body with warm water from the neck down.  DO NOT shower/wash with your normal soap after using and rinsing off the CHG Soap.  Pat yourself dry with a CLEAN TOWEL.  Wear CLEAN PAJAMAS to bed the night before surgery  Place CLEAN SHEETS on your bed the night before your surgery  DO NOT SLEEP WITH PETS.   Day of Surgery: Take a shower with CHG soap as directed above. Wear Clean/Comfortable clothing the morning of surgery Do not apply any deodorants/lotions.   Remember to brush your teeth WITH YOUR REGULAR TOOTHPASTE.   Please read over the following fact sheets that you were  given.

## 2020-08-28 ENCOUNTER — Encounter (HOSPITAL_COMMUNITY): Payer: Self-pay

## 2020-08-28 ENCOUNTER — Encounter (HOSPITAL_COMMUNITY)
Admission: RE | Admit: 2020-08-28 | Discharge: 2020-08-28 | Disposition: A | Discharge status: Home / Self Care | Payer: Medicare Other | Source: Ambulatory Visit | Attending: Otolaryngology | Admitting: Otolaryngology

## 2020-08-28 ENCOUNTER — Other Ambulatory Visit: Payer: Self-pay

## 2020-08-28 DIAGNOSIS — Z20822 Contact with and (suspected) exposure to covid-19: Secondary | ICD-10-CM | POA: Insufficient documentation

## 2020-08-28 DIAGNOSIS — Z01818 Encounter for other preprocedural examination: Secondary | ICD-10-CM | POA: Diagnosis not present

## 2020-08-28 LAB — BASIC METABOLIC PANEL
Anion gap: 7 (ref 5–15)
BUN: 20 mg/dL (ref 6–20)
CO2: 26 mmol/L (ref 22–32)
Calcium: 9.1 mg/dL (ref 8.9–10.3)
Chloride: 108 mmol/L (ref 98–111)
Creatinine, Ser: 0.97 mg/dL (ref 0.61–1.24)
GFR, Estimated: >60 mL/min (ref >60)
Glucose, Bld: 179 mg/dL — ABNORMAL HIGH (ref 70–99)
Potassium: 3.6 mmol/L (ref 3.5–5.1)
Sodium: 141 mmol/L (ref 135–145)

## 2020-08-28 LAB — CBC
HCT: 42.1 % (ref 39.0–52.0)
Hemoglobin: 13.8 g/dL (ref 13.0–17.0)
MCH: 31.4 pg (ref 26.0–34.0)
MCHC: 32.8 g/dL (ref 30.0–36.0)
MCV: 95.7 fL (ref 80.0–100.0)
Platelets: 152 10*3/uL (ref 150–400)
RBC: 4.4 MIL/uL (ref 4.22–5.81)
RDW: 12.6 % (ref 11.5–15.5)
WBC: 8 10*3/uL (ref 4.0–10.5)
nRBC: 0 % (ref 0.0–0.2)

## 2020-08-28 LAB — SARS CORONAVIRUS 2 (TAT 6-24 HRS): SARS Coronavirus 2: NEGATIVE

## 2020-08-28 LAB — GLUCOSE, CAPILLARY: Glucose-Capillary: 142 mg/dL — ABNORMAL HIGH (ref 70–99)

## 2020-08-28 NOTE — Progress Notes (Addendum)
PCP - Dr. Hillery Aldo w/[facility] Cardiologist - pt denies  PPM/ICD - n/a  Chest x-ray - n/a EKG - 08/28/20 in PAT Stress Test - n/a ECHO - n/a Cardiac Cath - n/a  Sleep Study - 09/24/2018 CPAP - yes  Fasting Blood Sugar - pt reports blood sugar normally runs between 98-115 Checks Blood Sugar 2-4 times a day **release of information form sent to [facility] requesting pt's A1C he said he had drawn on 08/24/20   Blood Thinner Instructions: n/a Aspirin Instructions: n/a  ERAS Protcol - yes PRE-SURGERY Ensure or G2- no  COVID TEST- 08/28/20 in PAT   Anesthesia review: yes, abnormal EKG  Patient denies shortness of breath, fever, cough and chest pain at PAT appointment   All instructions explained to the patient, with a verbal understanding of the material. Patient agrees to go over the instructions while at home for a better understanding. Patient also instructed to self quarantine after being tested for COVID-19. The opportunity to ask questions was provided.

## 2020-08-29 NOTE — Anesthesia Preprocedure Evaluation (Addendum)
Anesthesia Evaluation  Patient identified by MRN, date of birth, ID band Patient awake    Reviewed: Allergy & Precautions, NPO status , Patient's Chart, lab work & pertinent test results  Airway Mallampati: III  TM Distance: >3 FB Neck ROM: Full    Dental no notable dental hx.    Pulmonary asthma , sleep apnea and Continuous Positive Airway Pressure Ventilation , Current Smoker,    Pulmonary exam normal breath sounds clear to auscultation       Cardiovascular hypertension, + Peripheral Vascular Disease  Normal cardiovascular exam Rhythm:Regular Rate:Normal     Neuro/Psych negative neurological ROS  negative psych ROS   GI/Hepatic negative GI ROS, Neg liver ROS,   Endo/Other  diabetes  Renal/GU negative Renal ROS     Musculoskeletal negative musculoskeletal ROS (+)   Abdominal   Peds  Hematology negative hematology ROS (+)   Anesthesia Other Findings   Reproductive/Obstetrics negative OB ROS                            Anesthesia Physical Anesthesia Plan  ASA: 3  Anesthesia Plan: General   Post-op Pain Management:    Induction: Intravenous  PONV Risk Score and Plan: 1 and Midazolam, Treatment may vary due to age or medical condition, Ondansetron and Dexamethasone  Airway Management Planned: Oral ETT  Additional Equipment: None  Intra-op Plan:   Post-operative Plan: Extubation in OR  Informed Consent: I have reviewed the patients History and Physical, chart, labs and discussed the procedure including the risks, benefits and alternatives for the proposed anesthesia with the patient or authorized representative who has indicated his/her understanding and acceptance.       Plan Discussed with: CRNA and Anesthesiologist  Anesthesia Plan Comments: (PAT note by Antionette Poles, PA-C: Abnormal EKG noted at preop showing NSR, rate 86, septal infarct, age undetermined.  When compared  to previous tracing from 08/18/2016, there does not appear to be any significant change.  Patient has no history of CAD.  Per review of recent preop testing notes at [facility] on 07/12/2020, patient reportedly walks 45 minutes daily for exercise and completes greater than 4 METS of activity.  He has history of OSA but noncompliant with CPAP due to discomfort with device.  He reported he no longer snores since intentional 60 pound weight loss.  History of moderate persistent asthma followed by pulmonology.  He is maintained on as needed Breo and albuterol.  Stable.  History of lumbar neurostimulator implant.  History of poorly controlled DM2.  Per telephone encounter in Care Everywhere dated 08/11/2020, patient had recent A1c of 7.0 at PCP office.  These results of been requested.  Preop labs reviewed, unremarkable.   )       Anesthesia Quick Evaluation

## 2020-08-29 NOTE — Progress Notes (Signed)
Anesthesia Chart Review:  Abnormal EKG noted at preop showing NSR, rate 86, septal infarct, age undetermined.  When compared to previous tracing from 08/18/2016, there does not appear to be any significant change.  Patient has no history of CAD.  Per review of recent preop testing notes at [facility] on 07/12/2020, patient reportedly walks 45 minutes daily for exercise and completes greater than 4 METS of activity.  He has history of OSA but noncompliant with CPAP due to discomfort with device.  He reported he no longer snores since intentional 60 pound weight loss.  History of moderate persistent asthma followed by pulmonology.  He is maintained on as needed Breo and albuterol.  Stable.  History of lumbar neurostimulator implant.  History of poorly controlled DM2.  Per telephone encounter in Care Everywhere dated 08/11/2020, patient had recent A1c of 7.0 at PCP office.  These results of been requested.  Preop labs reviewed, unremarkable.    Zannie Cove [facility] Short Stay Center/Anesthesiology Phone [phone] 08/29/2020 4:13 PM

## 2020-08-30 ENCOUNTER — Encounter (HOSPITAL_COMMUNITY): Payer: Self-pay | Admitting: Otolaryngology

## 2020-08-30 ENCOUNTER — Ambulatory Visit (HOSPITAL_COMMUNITY): Payer: Medicare Other | Admitting: Physician Assistant

## 2020-08-30 ENCOUNTER — Encounter (HOSPITAL_COMMUNITY)
Admission: RE | Disposition: A | Discharge status: Home / Self Care | Payer: Self-pay | Source: Home / Self Care | Attending: Otolaryngology

## 2020-08-30 ENCOUNTER — Ambulatory Visit (HOSPITAL_COMMUNITY): Payer: Medicare Other

## 2020-08-30 ENCOUNTER — Observation Stay (HOSPITAL_COMMUNITY)
Admission: RE | Admit: 2020-08-30 | Discharge: 2020-08-31 | Disposition: A | Discharge status: Home / Self Care | Payer: Medicare Other | Attending: Otolaryngology | Admitting: Otolaryngology

## 2020-08-30 ENCOUNTER — Other Ambulatory Visit: Payer: Self-pay

## 2020-08-30 DIAGNOSIS — Z79899 Other long term (current) drug therapy: Secondary | ICD-10-CM | POA: Insufficient documentation

## 2020-08-30 DIAGNOSIS — J3489 Other specified disorders of nose and nasal sinuses: Secondary | ICD-10-CM | POA: Diagnosis not present

## 2020-08-30 DIAGNOSIS — F17210 Nicotine dependence, cigarettes, uncomplicated: Secondary | ICD-10-CM | POA: Diagnosis not present

## 2020-08-30 DIAGNOSIS — J343 Hypertrophy of nasal turbinates: Secondary | ICD-10-CM | POA: Diagnosis not present

## 2020-08-30 DIAGNOSIS — J45909 Unspecified asthma, uncomplicated: Secondary | ICD-10-CM | POA: Diagnosis not present

## 2020-08-30 DIAGNOSIS — E119 Type 2 diabetes mellitus without complications: Secondary | ICD-10-CM | POA: Insufficient documentation

## 2020-08-30 DIAGNOSIS — I10 Essential (primary) hypertension: Secondary | ICD-10-CM | POA: Diagnosis not present

## 2020-08-30 DIAGNOSIS — Z7984 Long term (current) use of oral hypoglycemic drugs: Secondary | ICD-10-CM | POA: Insufficient documentation

## 2020-08-30 DIAGNOSIS — J342 Deviated nasal septum: Principal | ICD-10-CM | POA: Diagnosis present

## 2020-08-30 HISTORY — PX: NASAL SEPTOPLASTY W/ TURBINOPLASTY: SHX2070

## 2020-08-30 LAB — GLUCOSE, CAPILLARY
Glucose-Capillary: 80 mg/dL (ref 70–99)
Glucose-Capillary: 77 mg/dL (ref 70–99)
Glucose-Capillary: 124 mg/dL — ABNORMAL HIGH (ref 70–99)
Glucose-Capillary: 130 mg/dL — ABNORMAL HIGH (ref 70–99)
Glucose-Capillary: 258 mg/dL — ABNORMAL HIGH (ref 70–99)
Glucose-Capillary: 271 mg/dL — ABNORMAL HIGH (ref 70–99)

## 2020-08-30 SURGERY — SEPTOPLASTY, NOSE, WITH NASAL TURBINATE REDUCTION
Anesthesia: General | Site: Nose

## 2020-08-30 MED ORDER — METFORMIN HCL 500 MG PO TABS
1000.0000 mg | ORAL_TABLET | Freq: Two times a day (BID) | ORAL | Status: DC
  Administered 2020-08-30 – 2020-08-31 (×2): 1000 mg via ORAL
  Filled 2020-08-30 (×2): qty 2

## 2020-08-30 MED ORDER — DEXAMETHASONE SODIUM PHOSPHATE 10 MG/ML IJ SOLN
INTRAMUSCULAR | Status: DC | PRN
  Administered 2020-08-30: 10 mg via INTRAVENOUS

## 2020-08-30 MED ORDER — FENTANYL CITRATE (PF) 100 MCG/2ML IJ SOLN
INTRAMUSCULAR | Status: AC
  Filled 2020-08-30: qty 2

## 2020-08-30 MED ORDER — ONDANSETRON HCL 4 MG PO TABS: 4.0000 mg | ORAL_TABLET | ORAL | Status: DC | PRN

## 2020-08-30 MED ORDER — MIDAZOLAM HCL 2 MG/2ML IJ SOLN
INTRAMUSCULAR | Status: AC
  Filled 2020-08-30: qty 2

## 2020-08-30 MED ORDER — PROPOFOL 10 MG/ML IV BOLUS
INTRAVENOUS | Status: AC
  Filled 2020-08-30: qty 40

## 2020-08-30 MED ORDER — OXYMETAZOLINE HCL 0.05 % NA SOLN
NASAL | Status: DC | PRN
  Administered 2020-08-30: 1

## 2020-08-30 MED ORDER — MORPHINE SULFATE (PF) 2 MG/ML IV SOLN: 2.0000 mg | INTRAVENOUS | Status: DC | PRN

## 2020-08-30 MED ORDER — SODIUM CHLORIDE 0.9 % IR SOLN
Status: DC | PRN
  Administered 2020-08-30: 1000 mL

## 2020-08-30 MED ORDER — LIDOCAINE-EPINEPHRINE 1 %-1:100000 IJ SOLN
INTRAMUSCULAR | Status: AC
  Filled 2020-08-30: qty 1

## 2020-08-30 MED ORDER — CEFAZOLIN SODIUM-DEXTROSE 1-4 GM/50ML-% IV SOLN
1.0000 g | Freq: Three times a day (TID) | INTRAVENOUS | Status: AC
  Administered 2020-08-30 – 2020-08-31 (×2): 1 g via INTRAVENOUS
  Filled 2020-08-30 (×2): qty 50

## 2020-08-30 MED ORDER — EPHEDRINE SULFATE 50 MG/ML IJ SOLN
INTRAMUSCULAR | Status: DC | PRN
  Administered 2020-08-30 (×2): 5 mg via INTRAVENOUS

## 2020-08-30 MED ORDER — GABAPENTIN 300 MG PO CAPS: 300.0000 mg | ORAL_CAPSULE | Freq: Every day | ORAL | Status: DC | PRN

## 2020-08-30 MED ORDER — DEXAMETHASONE SODIUM PHOSPHATE 10 MG/ML IJ SOLN
INTRAMUSCULAR | Status: AC
  Filled 2020-08-30: qty 1

## 2020-08-30 MED ORDER — FENTANYL CITRATE (PF) 250 MCG/5ML IJ SOLN
INTRAMUSCULAR | Status: DC | PRN
  Administered 2020-08-30: 50 ug via INTRAVENOUS
  Administered 2020-08-30: 100 ug via INTRAVENOUS

## 2020-08-30 MED ORDER — PHENYLEPHRINE 40 MCG/ML (10ML) SYRINGE FOR IV PUSH (FOR BLOOD PRESSURE SUPPORT)
PREFILLED_SYRINGE | INTRAVENOUS | Status: DC | PRN
  Administered 2020-08-30: 80 ug via INTRAVENOUS
  Administered 2020-08-30: 200 ug via INTRAVENOUS
  Administered 2020-08-30: 120 ug via INTRAVENOUS
  Administered 2020-08-30: 80 ug via INTRAVENOUS

## 2020-08-30 MED ORDER — POTASSIUM CHLORIDE IN NACL 20-0.9 MEQ/L-% IV SOLN
INTRAVENOUS | Status: DC
  Filled 2020-08-30: qty 1000

## 2020-08-30 MED ORDER — LIDOCAINE-EPINEPHRINE 1 %-1:100000 IJ SOLN
INTRAMUSCULAR | Status: DC | PRN
  Administered 2020-08-30: 10 mL

## 2020-08-30 MED ORDER — GLIPIZIDE 5 MG PO TABS
10.0000 mg | ORAL_TABLET | Freq: Every day | ORAL | Status: DC
  Administered 2020-08-31: 10 mg via ORAL
  Filled 2020-08-30: qty 2

## 2020-08-30 MED ORDER — CHLORHEXIDINE GLUCONATE CLOTH 2 % EX PADS: 6.0000 | MEDICATED_PAD | Freq: Once | CUTANEOUS | Status: DC

## 2020-08-30 MED ORDER — LIDOCAINE 2% (20 MG/ML) 5 ML SYRINGE
INTRAMUSCULAR | Status: DC | PRN
Start: 2020-08-30 — End: 2020-08-30
  Administered 2020-08-30: 100 mg via INTRAVENOUS

## 2020-08-30 MED ORDER — ONDANSETRON HCL 4 MG/2ML IJ SOLN
INTRAMUSCULAR | Status: DC | PRN
  Administered 2020-08-30: 4 mg via INTRAVENOUS

## 2020-08-30 MED ORDER — INSULIN ASPART 100 UNIT/ML IJ SOLN
0.0000 [IU] | Freq: Three times a day (TID) | INTRAMUSCULAR | Status: DC
  Administered 2020-08-30: 5 [IU] via SUBCUTANEOUS
  Administered 2020-08-31: 1 [IU] via SUBCUTANEOUS

## 2020-08-30 MED ORDER — ACETAMINOPHEN 650 MG RE SUPP: 650.0000 mg | RECTAL | Status: DC | PRN

## 2020-08-30 MED ORDER — ROCURONIUM BROMIDE 10 MG/ML (PF) SYRINGE
PREFILLED_SYRINGE | INTRAVENOUS | Status: DC | PRN
Start: 2020-08-30 — End: 2020-08-30
  Administered 2020-08-30: 80 mg via INTRAVENOUS
  Administered 2020-08-30: 10 mg via INTRAVENOUS

## 2020-08-30 MED ORDER — HYDROCODONE-ACETAMINOPHEN 7.5-325 MG/15ML PO SOLN
15.0000 mL | ORAL | Status: DC | PRN
  Administered 2020-08-30 – 2020-08-31 (×4): 15 mL via ORAL
  Filled 2020-08-30 (×4): qty 15

## 2020-08-30 MED ORDER — CEFAZOLIN SODIUM-DEXTROSE 1-4 GM/50ML-% IV SOLN: 1.0000 g | Freq: Three times a day (TID) | INTRAVENOUS | Status: DC

## 2020-08-30 MED ORDER — ORAL CARE MOUTH RINSE: 15.0000 mL | Freq: Once | OROMUCOSAL | Status: AC

## 2020-08-30 MED ORDER — SUGAMMADEX SODIUM 200 MG/2ML IV SOLN
INTRAVENOUS | Status: DC | PRN
  Administered 2020-08-30: 400 mg via INTRAVENOUS

## 2020-08-30 MED ORDER — PROMETHAZINE HCL 25 MG/ML IJ SOLN: 6.2500 mg | INTRAMUSCULAR | Status: DC | PRN

## 2020-08-30 MED ORDER — 0.9 % SODIUM CHLORIDE (POUR BTL) OPTIME
TOPICAL | Status: DC | PRN
  Administered 2020-08-30: 1000 mL

## 2020-08-30 MED ORDER — ROCURONIUM BROMIDE 10 MG/ML (PF) SYRINGE
PREFILLED_SYRINGE | INTRAVENOUS | Status: AC
  Filled 2020-08-30: qty 10

## 2020-08-30 MED ORDER — ALBUTEROL SULFATE HFA 108 (90 BASE) MCG/ACT IN AERS: 2.0000 | INHALATION_SPRAY | Freq: Four times a day (QID) | RESPIRATORY_TRACT | Status: DC | PRN

## 2020-08-30 MED ORDER — CEFAZOLIN IN SODIUM CHLORIDE 3-0.9 GM/100ML-% IV SOLN
3.0000 g | INTRAVENOUS | Status: AC
  Administered 2020-08-30: 3 g via INTRAVENOUS
  Filled 2020-08-30: qty 100

## 2020-08-30 MED ORDER — ACETAMINOPHEN 160 MG/5ML PO SOLN
650.0000 mg | ORAL | Status: DC | PRN
  Filled 2020-08-30: qty 20.3

## 2020-08-30 MED ORDER — ALBUTEROL SULFATE HFA 108 (90 BASE) MCG/ACT IN AERS
INHALATION_SPRAY | RESPIRATORY_TRACT | Status: DC | PRN
  Administered 2020-08-30: 6 via RESPIRATORY_TRACT

## 2020-08-30 MED ORDER — PHENYLEPHRINE HCL-NACL 10-0.9 MG/250ML-% IV SOLN
INTRAVENOUS | Status: DC | PRN
  Administered 2020-08-30: 40 ug/min via INTRAVENOUS

## 2020-08-30 MED ORDER — PROPOFOL 10 MG/ML IV BOLUS
INTRAVENOUS | Status: DC | PRN
  Administered 2020-08-30: 50 mg via INTRAVENOUS
  Administered 2020-08-30: 150 mg via INTRAVENOUS

## 2020-08-30 MED ORDER — ALBUMIN HUMAN 5 % IV SOLN: INTRAVENOUS | Status: DC | PRN

## 2020-08-30 MED ORDER — ONDANSETRON HCL 4 MG/2ML IJ SOLN: 4.0000 mg | INTRAMUSCULAR | Status: DC | PRN

## 2020-08-30 MED ORDER — MUPIROCIN 2 % EX OINT
TOPICAL_OINTMENT | CUTANEOUS | Status: AC
  Filled 2020-08-30: qty 22

## 2020-08-30 MED ORDER — LISINOPRIL 20 MG PO TABS
20.0000 mg | ORAL_TABLET | Freq: Every day | ORAL | Status: DC
  Administered 2020-08-30 – 2020-08-31 (×2): 20 mg via ORAL
  Filled 2020-08-30 (×2): qty 1

## 2020-08-30 MED ORDER — FENTANYL CITRATE (PF) 100 MCG/2ML IJ SOLN
25.0000 ug | INTRAMUSCULAR | Status: DC | PRN
  Administered 2020-08-30 (×3): 50 ug via INTRAVENOUS

## 2020-08-30 MED ORDER — FENTANYL CITRATE (PF) 250 MCG/5ML IJ SOLN
INTRAMUSCULAR | Status: AC
  Filled 2020-08-30: qty 5

## 2020-08-30 MED ORDER — IBUPROFEN 400 MG PO TABS
400.0000 mg | ORAL_TABLET | Freq: Four times a day (QID) | ORAL | Status: DC | PRN
  Administered 2020-08-30: 400 mg via ORAL
  Filled 2020-08-30: qty 1

## 2020-08-30 MED ORDER — OXYMETAZOLINE HCL 0.05 % NA SOLN
NASAL | Status: AC
  Filled 2020-08-30: qty 30

## 2020-08-30 MED ORDER — SITAGLIPTIN PHOS-METFORMIN HCL 50-1000 MG PO TABS: 1.0000 | ORAL_TABLET | Freq: Two times a day (BID) | ORAL | Status: DC

## 2020-08-30 MED ORDER — LACTATED RINGERS IV SOLN: INTRAVENOUS | Status: DC

## 2020-08-30 MED ORDER — ONDANSETRON HCL 4 MG/2ML IJ SOLN
INTRAMUSCULAR | Status: AC
  Filled 2020-08-30: qty 2

## 2020-08-30 MED ORDER — ALBUTEROL SULFATE (2.5 MG/3ML) 0.083% IN NEBU: 2.5000 mg | INHALATION_SOLUTION | Freq: Four times a day (QID) | RESPIRATORY_TRACT | Status: DC | PRN

## 2020-08-30 MED ORDER — CELECOXIB 200 MG PO CAPS
200.0000 mg | ORAL_CAPSULE | Freq: Every day | ORAL | Status: DC
  Administered 2020-08-30 – 2020-08-31 (×2): 200 mg via ORAL
  Filled 2020-08-30 (×3): qty 1

## 2020-08-30 MED ORDER — MIDAZOLAM HCL 2 MG/2ML IJ SOLN
INTRAMUSCULAR | Status: DC | PRN
  Administered 2020-08-30: 2 mg via INTRAVENOUS

## 2020-08-30 MED ORDER — LINAGLIPTIN 5 MG PO TABS
5.0000 mg | ORAL_TABLET | Freq: Every day | ORAL | Status: DC
  Administered 2020-08-30 – 2020-08-31 (×2): 5 mg via ORAL
  Filled 2020-08-30 (×2): qty 1

## 2020-08-30 MED ORDER — MORPHINE SULFATE (PF) 4 MG/ML IV SOLN
INTRAVENOUS | Status: AC
  Filled 2020-08-30: qty 1

## 2020-08-30 MED ORDER — LIDOCAINE 2% (20 MG/ML) 5 ML SYRINGE
INTRAMUSCULAR | Status: AC
  Filled 2020-08-30: qty 5

## 2020-08-30 MED ORDER — MUPIROCIN 2 % EX OINT
TOPICAL_OINTMENT | CUTANEOUS | Status: DC | PRN
  Administered 2020-08-30: 1 via NASAL

## 2020-08-30 MED ORDER — HYDROCODONE-ACETAMINOPHEN 7.5-325 MG PO TABS: 1.0000 | ORAL_TABLET | Freq: Three times a day (TID) | ORAL | Status: DC | PRN

## 2020-08-30 MED ORDER — HYDROCODONE-ACETAMINOPHEN 7.5-325 MG/15ML PO SOLN
ORAL | Status: AC
  Administered 2020-08-30: 15 mL
  Filled 2020-08-30: qty 15

## 2020-08-30 MED ORDER — CHLORHEXIDINE GLUCONATE 0.12 % MT SOLN
15.0000 mL | Freq: Once | OROMUCOSAL | Status: AC
  Administered 2020-08-30: 15 mL via OROMUCOSAL
  Filled 2020-08-30: qty 15

## 2020-08-30 SURGICAL SUPPLY — 33 items
BAG COUNTER SPONGE SURGICOUNT (BAG) ×2 IMPLANT
BLADE INF TURB ROT M4 2 5PK (BLADE) IMPLANT
BLADE SURG 15 STRL LF DISP TIS (BLADE) ×1 IMPLANT
BLADE SURG 15 STRL SS (BLADE) ×1
BLADE TRICUT ROTATE M4 4 5PK (BLADE) IMPLANT
CANISTER SUCT 3000ML PPV (MISCELLANEOUS) ×4 IMPLANT
COAGULATOR SUCT 8FR VV (MISCELLANEOUS) ×2 IMPLANT
DECANTER SPIKE VIAL GLASS SM (MISCELLANEOUS) ×2 IMPLANT
DRSG NASOPORE 8CM (GAUZE/BANDAGES/DRESSINGS) ×2 IMPLANT
DRSG TELFA 3X8 NADH (GAUZE/BANDAGES/DRESSINGS) ×2 IMPLANT
ELECT REM PT RETURN 9FT ADLT (ELECTROSURGICAL) ×2
ELECTRODE REM PT RTRN 9FT ADLT (ELECTROSURGICAL) ×1 IMPLANT
GAUZE SPONGE 2X2 8PLY STRL LF (GAUZE/BANDAGES/DRESSINGS) ×1 IMPLANT
GLOVE SURG MICRO LTX SZ7.5 (GLOVE) ×2 IMPLANT
GOWN STRL REUS W/ TWL LRG LVL3 (GOWN DISPOSABLE) ×1 IMPLANT
GOWN STRL REUS W/ TWL XL LVL3 (GOWN DISPOSABLE) ×1 IMPLANT
GOWN STRL REUS W/TWL LRG LVL3 (GOWN DISPOSABLE) ×1
GOWN STRL REUS W/TWL XL LVL3 (GOWN DISPOSABLE) ×1
KIT BASIN OR (CUSTOM PROCEDURE TRAY) ×2 IMPLANT
KIT TURNOVER KIT B (KITS) ×2 IMPLANT
NEEDLE PRECISIONGLIDE 27X1.5 (NEEDLE) ×2 IMPLANT
NS IRRIG 1000ML POUR BTL (IV SOLUTION) ×2 IMPLANT
PAD ARMBOARD 7.5X6 YLW CONV (MISCELLANEOUS) ×4 IMPLANT
PATTIES SURGICAL .5 X3 (DISPOSABLE) ×2 IMPLANT
SOL ANTI FOG 6CC (MISCELLANEOUS) ×1 IMPLANT
SOLUTION ANTI FOG 6CC (MISCELLANEOUS) ×1
SPONGE GAUZE 2X2 STER 10/PKG (GAUZE/BANDAGES/DRESSINGS) ×1
SUT CHROMIC 3 0 PS 2 (SUTURE) ×2 IMPLANT
SUT CHROMIC 4 0 P 3 18 (SUTURE) ×2 IMPLANT
SUT CHROMIC 5 0 P 3 (SUTURE) ×2 IMPLANT
SUT SILK 2 0 PERMA HAND 18 BK (SUTURE) ×2 IMPLANT
TRAY ENT MC OR (CUSTOM PROCEDURE TRAY) ×2 IMPLANT
TUBE CONNECTING 12X1/4 (SUCTIONS) ×2 IMPLANT

## 2020-08-30 NOTE — Anesthesia Postprocedure Evaluation (Signed)
Anesthesia Post Note  Patient: Christian Bowen  Procedure(s) Performed: NASAL SEPTOPLASTY WITH BILATERAL TURBINATE REDUCTION AND VESTIBULAPLASTY (Nose)     Patient location during evaluation: PACU Anesthesia Type: General Level of consciousness: awake Pain management: pain level controlled Vital Signs Assessment: post-procedure vital signs reviewed and stable Respiratory status: spontaneous breathing and respiratory function stable Cardiovascular status: stable Postop Assessment: no apparent nausea or vomiting Anesthetic complications: no   No notable events documented.  Last Vitals:  Vitals:   08/30/20 1648 08/30/20 2016  BP: (!) 152/97 (!) 171/95  Pulse: 91 97  Resp: 19 18  Temp: 36.7 C 36.8 C  SpO2: 95% 98%    Last Pain:  Vitals:   08/30/20 2016  TempSrc: Oral  PainSc:                  [name]     

## 2020-08-30 NOTE — Anesthesia Procedure Notes (Signed)
Procedure Name: Intubation Date/Time: 08/30/2020 1:25 PM Performed by: Inda Coke, CRNA Pre-anesthesia Checklist: Patient identified, Emergency Drugs available, Suction available and Patient being monitored Patient Re-evaluated:Patient Re-evaluated prior to induction Oxygen Delivery Method: Circle System Utilized Preoxygenation: Pre-oxygenation with 100% oxygen Induction Type: IV induction Ventilation: Two handed mask ventilation required, Mask ventilation with difficulty and Oral airway inserted - appropriate to patient size Laryngoscope Size: Glidescope and 4 Grade View: Grade I Tube type: Oral Tube size: 7.5 mm Number of attempts: 2 Airway Equipment and Method: Stylet, Oral airway and Video-laryngoscopy Placement Confirmation: ETT inserted through vocal cords under direct vision, positive ETCO2 and breath sounds checked- equal and bilateral Secured at: 26 cm Tube secured with: Tape Dental Injury: Teeth and Oropharynx as per pre-operative assessment  Comments: Initial DL with Mac 4, grade IIB view, smooth intubation. Audible air leak, tried to reinflate cuff with continued airleak. Extubated and reintubated with glidescope with grade I view.

## 2020-08-30 NOTE — Brief Op Note (Signed)
08/30/2020  3:31 PM  PATIENT:  Christian Bowen  60 y.o. male  PRE-OPERATIVE DIAGNOSIS:  NASAL OBSTRUCTION, OBSTRUCTIVE SLEEP APNEA  POST-OPERATIVE DIAGNOSIS:  NASAL OBSTRUCTION, OBSTRUCTIVE SLEEP APNEA  PROCEDURE:  Procedure(s): NASAL SEPTOPLASTY WITH BILATERAL TURBINATE REDUCTION AND VESTIBULAPLASTY (N/A) on the Left  SURGEON:  Surgeon(s) and Role:    Drema Halon, MD - Primary  PHYSICIAN ASSISTANT:   ASSISTANTS: none   ANESTHESIA:   general  EBL:  10 mL   BLOOD ADMINISTERED:none  DRAINS: none   LOCAL MEDICATIONS USED:  XYLOCAINE   SPECIMEN:  No Specimen  DISPOSITION OF SPECIMEN:  N/A  COUNTS:  YES  TOURNIQUET:  * No tourniquets in log *  DICTATION: .Other Dictation: Dictation Number [id number]  PLAN OF CARE: Admit for overnight observation  PATIENT DISPOSITION:  PACU - hemodynamically stable.   Delay start of Pharmacological VTE agent (>24hrs) due to surgical blood loss or risk of bleeding: not applicable

## 2020-08-30 NOTE — Progress Notes (Signed)
Patient's CBG at 2131 is 258.patient told NT that he wants his sugar to be covered. Went to check patient and verified that he wants this reading to be covered. CBG rechecked it was [phone]. Notified Dr. Ezzard Standing and new order to give 5u Novolog. Administered per MD order.  Call bell within reach and will continue to monitor.

## 2020-08-30 NOTE — Anesthesia Postprocedure Evaluation (Signed)
Anesthesia Post Note  Patient: Christian Bowen  Procedure(s) Performed: NASAL SEPTOPLASTY WITH BILATERAL TURBINATE REDUCTION AND VESTIBULAPLASTY (Nose)     Patient location during evaluation: PACU Anesthesia Type: General Level of consciousness: awake Pain management: pain level controlled Vital Signs Assessment: post-procedure vital signs reviewed and stable Respiratory status: spontaneous breathing and respiratory function stable Cardiovascular status: stable Postop Assessment: no apparent nausea or vomiting Anesthetic complications: no   No notable events documented.  Last Vitals:  Vitals:   08/30/20 1648 08/30/20 2016  BP: (!) 152/97 (!) 171/95  Pulse: 91 97  Resp: 19 18  Temp: 36.7 C 36.8 C  SpO2: 95% 98%    Last Pain:  Vitals:   08/30/20 2016  TempSrc: Oral  PainSc:                  Mellody Dance

## 2020-08-30 NOTE — Interval H&P Note (Signed)
History and Physical Interval Note:  08/30/2020 12:57 PM  Christian Bowen  has presented today for surgery, with the diagnosis of NASAL OBSTRUCTION, OBSTRUCTIVE SLEEP APNEA.  The various methods of treatment have been discussed with the patient and family. After consideration of risks, benefits and other options for treatment, the patient has consented to  Procedure(s): NASAL SEPTOPLASTY WITH BILATERAL TURBINATE REDUCTION AND VESTIBULAPLASTY (N/A) as a surgical intervention.  [name] history has been reviewed, patient examined, no change in status, stable for surgery.  I have reviewed the patient's chart and labs.  Questions were answered to the patient's satisfaction.     Dillard Cannon

## 2020-08-30 NOTE — Interval H&P Note (Signed)
History and Physical Interval Note:  08/30/2020 12:56 PM  Christian Bowen  has presented today for surgery, with the diagnosis of NASAL OBSTRUCTION, OBSTRUCTIVE SLEEP APNEA.  The various methods of treatment have been discussed with the patient and family. After consideration of risks, benefits and other options for treatment, the patient has consented to  Procedure(s): NASAL SEPTOPLASTY WITH BILATERAL TURBINATE REDUCTION AND VESTIBULAPLASTY (N/A) as a surgical intervention.  [name] history has been reviewed, patient examined, no change in status, stable for surgery.  I have reviewed the patient's chart and labs.  Questions were answered to the patient's satisfaction.     Dillard Cannon

## 2020-08-30 NOTE — Op Note (Signed)
Christian Bowen, Christian Bowen MEDICAL RECORD NO: [id number] ACCOUNT NO: [id number] DATE OF BIRTH: 16-Sep-1960 FACILITY: [facility] LOCATION: [facility] PHYSICIAN: Kristine Garbe. 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  OPERATION PERFORMED:  Septoplasty with bilateral inferior turbinate reductions and left nasal vestibuloplasty.  SURGEON:  Dillard Cannon, MD  ANESTHESIA: General endotracheal.  BLOOD LOSS:  10-20 mL  COMPLICATIONS:  None.  BRIEF CLINICAL NOTE:  The patient is a 60 year old gentleman with obstructive sleep apnea.  He has difficulty using the full mask sleep apnea machine because of difficulty breathing through the left side of his nose.  He had, had previous surgery  including septoplasty and turbinate reductions and UPPP 15 years ago, but more recently he has been having more trouble breathing through the left side of his nose.  On exam, he has septal deviation to the left with a narrowed left nasal valve with  tendency for left nasal valve to collapse on inhalation.  He has moderate-sized turbinates.  No polyps and intranasal exam is otherwise clear.  He is taken to the operating room at this time for septoplasty with bilateral inferior turbinate reductions  and left nasal vestibuloplasty.  DESCRIPTION OF PROCEDURE:  After adequate endotracheal anesthesia, the patient received 2 grams of Ancef IV preoperatively.  The nose was then prepped with Betadine solution.  The nose was then further prepped with cotton pledget soaked in Afrin and the  septum and turbinates were injected with Xylocaine with epinephrine for hemostasis.  On examination of the nose, the patient had a septum that deviated more to the left, although anteriorly the cartilaginous  septum bowed over to the right airway  vertically and the cartilaginous septum a little more posteriorly bowed more to the left.  He had a narrow left nasal valve compared to the right.  A hemitransfixion incision was made along the caudal edge of the septum on the right side.  The very  inferior aspect of the cartilaginous septum that bowed to the right, mucoperiosteal flaps were elevated on either side of the anterior cartilaginous septum for about 8-10 mm.  After elevating the mucoperichondrium off of this anterior one was fractured,  cartilaginous septum that bowed to the right, mucoperichondrial flaps were elevated more posteriorly just on the left side down to the maxillary crest and down to the floor of the nose.  More posteriorly at the junction of the cartilaginous and bony  septum, the bone did protrude a little bit more to the left and the cartilage was separated from the bone and some of the bony septum was removed.  In addition, a sliver of about 6 x 15 mm of cartilaginous septum was harvested for later placement for the  vestibuloplasty.  The portion of the cartilaginous septum that bowed off to the maxillary crest in the left side was trimmed inferiorly and the cartilaginous septum anteriorly was able to be pushed back more toward the midline, improving the left nasal  airway.  The short cartilaginous segment of the septum that was harvested measured probably 16-18 mm in length and 4-6 mm in width, was then used to help support the left nasal vestibule.  An incision was made between the upper and lower lateral  cartilage and a small pocket was created.  Hemostasis was obtained with suction cautery and the cartilaginous septum  was placed vertically within this pocket to help support the lower portion of the lateral cartilage and the upper portion of the lower  lateral cartilage.  Two 5-0 chromic sutures were used to close the pocket.  Next, the inferior turbinate reductions were performed using  the [employer] submucosal turbinate blade.  This was performed bilaterally on the inferior turbinates and then the  turbinate bone was outfractured.  Prior to closing the hemitransfixion incision, a small pocket was made between the mesial crura anteriorly and the portion of the cartilaginous septum that bowed more to the right was positioned in between the small  pocket with a single 3-0 chromic suture that was brought out between the mesial crura anteriorly.  The hemitransfixion incision was closed with interrupted 4-0 chromic suture.  The septum was basted with a 4-0 chromic suture.  This completed the  procedure.  The nose was then packed with a nasal trumpet and placed along the floor of the left nasal airway, followed by Telfa soaked in bacitracin ointment placed along the nose bilaterally.  The nasal trumpet and packing was secured with a 2-0 silk  suture placed on both sides and secured anteriorly with a knot.  This completed the procedure.  The patient was awoken from anesthesia and was transferred to recovery room postop, doing well.  Because of history of sleep apnea, he will be observed  overnight for 24 hours and plan on removing the nasal packing, nasal trumpet tomorrow morning and discharging her home tomorrow morning.   SHW D: 08/30/2020 3:41:00 pm T: 08/30/2020 10:20:00 pm  JOB: [id number]/ [id number]

## 2020-08-30 NOTE — Transfer of Care (Signed)
Immediate Anesthesia Transfer of Care Note  Patient: Christian Bowen  Procedure(s) Performed: NASAL SEPTOPLASTY WITH BILATERAL TURBINATE REDUCTION AND VESTIBULAPLASTY (Nose)  Patient Location: PACU  Anesthesia Type:General  Level of Consciousness: awake and alert   Airway & Oxygen Therapy: Patient Spontanous Breathing and Patient connected to face mask oxygen  Post-op Assessment: Report given to RN and Post -op Vital signs reviewed and stable  Post vital signs: Reviewed and stable  Last Vitals:  Vitals Value Taken Time  BP    Temp    Pulse 94 08/30/20 1532  Resp 16 08/30/20 1532  SpO2 96 % 08/30/20 1532  Vitals shown include unvalidated device data.  Last Pain:  Vitals:   08/30/20 1131  TempSrc:   PainSc: 0-No pain         Complications: No notable events documented.

## 2020-08-31 ENCOUNTER — Encounter (HOSPITAL_COMMUNITY): Payer: Self-pay | Admitting: Otolaryngology

## 2020-08-31 DIAGNOSIS — J342 Deviated nasal septum: Secondary | ICD-10-CM | POA: Diagnosis not present

## 2020-08-31 LAB — GLUCOSE, CAPILLARY: Glucose-Capillary: 134 mg/dL — ABNORMAL HIGH (ref 70–99)

## 2020-08-31 MED ORDER — CEPHALEXIN 500 MG PO CAPS: 500.0000 mg | ORAL_CAPSULE | Freq: Two times a day (BID) | ORAL | 0 refills | Status: DC

## 2020-08-31 MED ORDER — PROAIR HFA 108 (90 BASE) MCG/ACT IN AERS: 2.0000 | INHALATION_SPRAY | Freq: Four times a day (QID) | RESPIRATORY_TRACT | 1 refills | Status: AC | PRN

## 2020-08-31 NOTE — Progress Notes (Addendum)
POD 1 Patient is s/p septoplasty and turbinate reductions yesterday doing well. No respiratory problems. AF VSS Nasal packing was removed at bedside with minimal bleeding.  Improved nasal airway Patient is discharged home on Keflex 500 mg twice daily for 10 days, Tylenol and ibuprofen as needed pain. Also instructed him on use of saline rinses for his nose once or twice a day. He will follow-up in my office next week on Wednesday for recheck.   Discharge dictated # [phone]

## 2020-08-31 NOTE — Progress Notes (Signed)
Discharge instructions (including medications) discussed with and copy provided to patient/caregiver 

## 2020-08-31 NOTE — Discharge Summary (Signed)
NAMETORRIE, LAFAVOR MEDICAL RECORD NO: [id number] ACCOUNT NO: [id number] DATE OF BIRTH: Mar 13, 1960 FACILITY: [facility] LOCATION: [facility] PHYSICIAN: Kristine Garbe. Ezzard Standing, MD  Discharge Summary   DATE OF DISCHARGE: 08/31/2020  [name] surgeries during this hospitalization are septoplasty, turbinate reductions and left vestibuloplasty performed on 08/30/2020.  HOSPITAL COURSE:  The patient was admitted via the operating room on 08/30/2020, because of nasal obstruction and difficulty breathing through his left nostril.  He has obstructive sleep apnea and uses full mask CPAP machine and has difficulty using the  machine because of the difficulty breathing through the left nostril.  On exam, he has a moderate septal deviation to the left with collapse of the left nasal valve and difficulty breathing through left side of his nose.  He is taken to the operating  room at this time for septoplasty and turbinate reductions as well as left nasal vestibuloplasty to improve his nasal airway, so that he can tolerate the CPAP machine better.  He underwent septoplasty, turbinate reductions and left nasal vestibuloplasty  on 08/30/2020.  He had his nose packed overnight and was admitted for 24-hour observation because of the packing and history of obstructive sleep apnea.  He did well that night with no significant breathing problems.  He received perioperative Ancef up  until discharge.  The nasal packing was removed on his first postoperative day on 08/31/2020, and the patient is discharged home.  He did well, complained of moderate pain.  He had minimal bleeding when the packing was removed and nasal airway was  improved.  DISPOSITION:  The patient is discharged home on Keflex 500 mg b.i.d. for 10 days.  Also, Tylenol and ibuprofen p.r.n. pain.  I instructed him to use saline rinses of his nose for the next couple days and he will follow up in my office in 6 days for recheck.  He will continue with his  regular medications as previously prescribed.  The patient is doing well at time of discharge.   SHW D: 08/31/2020 8:40:00 am T: 08/31/2020 9:13:00 am  JOB: [id number]/ [id number]

## 2020-08-31 NOTE — Discharge Instructions (Signed)
Take your regular medications as prescribed Also take the antibiotic Keflex 500 mg twice daily for the next 10 days starting this evening. Take Tylenol or ibuprofen as needed pain. Okay to blow the nose gently and use saline rinse a couple times a day to keep the nose clear. Return to Dr. Allene Pyo office in 6 days next Wednesday at 11:45 for recheck. Call office if he has any problems or questions [phone]

## 2020-08-31 NOTE — Plan of Care (Signed)
  Problem: Education: Goal: Knowledge of General Education information will improve Description: Including pain rating scale, medication(s)/side effects and non-pharmacologic comfort measures Outcome: Adequate for Discharge   

## 2020-09-01 LAB — HEMOGLOBIN A1C
Hgb A1c MFr Bld: 6.1 % — ABNORMAL HIGH (ref 4.8–5.6)
Mean Plasma Glucose: 128 mg/dL

## 2020-09-06 ENCOUNTER — Other Ambulatory Visit: Payer: Self-pay

## 2020-09-06 ENCOUNTER — Ambulatory Visit (INDEPENDENT_AMBULATORY_CARE_PROVIDER_SITE_OTHER): Payer: Medicare Other | Admitting: Otolaryngology

## 2020-09-06 DIAGNOSIS — Z4889 Encounter for other specified surgical aftercare: Secondary | ICD-10-CM

## 2020-09-06 NOTE — Progress Notes (Signed)
HPI: Christian Bowen is a 60 y.o. male who presents 7 days s/p septoplasty, turbinate reductions and left nasal vestibuloplasty..   Past Medical History:  Diagnosis Date   Apnea, sleep    was tested 2005   Asthma    Diabetes mellitus    Hyperlipemia 10/12/2013   Hypertension    Lumbar post-laminectomy syndrome 04/02/2013   Obesity    Pain in joint, shoulder region 10/12/2013   Sleep apnea    CPAP-has had UPPP-tonsils and turb red   Tobacco abuse 03/13/2013   Past Surgical History:  Procedure Laterality Date   BACK SURGERY  2009   DORSAL COMPARTMENT RELEASE Left 08/13/2012   Procedure: RELEASE DORSAL COMPARTMENT (DEQUERVAIN) and exploration of radial nerve branch;  Surgeon: Tami Ribas, MD;  Location: [facility];  Service: Orthopedics;  Laterality: Left;   GANGLION CYST EXCISION  2/14-SCG   lt wrist    GANGLION CYST EXCISION Left 08/13/2012   Procedure: REMOVAL GANGLION OF WRIST;  Surgeon: Tami Ribas, MD;  Location: [facility];  Service: Orthopedics;  Laterality: Left;   heel spurs     bilateral feel   NASAL SEPTOPLASTY W/ TURBINOPLASTY N/A 08/30/2020   Procedure: NASAL SEPTOPLASTY WITH BILATERAL TURBINATE REDUCTION AND VESTIBULAPLASTY;  Surgeon: Drema Halon, MD;  Location: [facility];  Service: ENT;  Laterality: N/A;   NASAL TURBINATE REDUCTION  8/13   septoplasty-UPPP-tonsils   SHOULDER ARTHROSCOPY Right 07/02/2013   Procedure: RIGHT SHOULDER ARTHROSCOPY WITH EXTENSIVE DEBRIDEMENT OF ROTATOR CUFF AND LABRUM, SUBACROMIAL DECOMPRESSION, PARTIAL ACROMIOPLASTY WITH CORACOACROMIAL RELEASE;  Surgeon: Thera Flake., MD;  Location: [facility];  Service: Orthopedics;  Laterality: Right;   SHOULDER ARTHROSCOPY WITH BICEPSTENOTOMY Right 04/15/2014   Procedure: SHOULDER ARTHROSCOPY WITH BICEPSTENOTOMY;  Surgeon: Frederico Hamman, MD;  Location: [facility];  Service: Orthopedics;  Laterality: Right;   SPINAL CORD STIMULATOR  IMPLANT  1997   SPINAL CORD STIMULATOR INSERTION N/A 04/02/2013   Procedure: LUMBAR SPINAL CORD STIMULATOR Insertion;  Surgeon: Gwynne Edinger, MD;  Location: [facility];  Service: Neurosurgery;  Laterality: N/A;   TONSILLECTOMY     Social History   Socioeconomic History   Marital status: Single    Spouse name: Not on file   Number of children: Not on file   Years of education: Not on file   Highest education level: Not on file  Occupational History   Occupation: Disabled  Tobacco Use   Smoking status: Some Days    Packs/day: 0.25    Years: 30.00    Pack years: 7.50    Types: Cigarettes   Smokeless tobacco: Never   Tobacco comments:    1 pack will last him 2 weeks   Substance and Sexual Activity   Alcohol use: Yes    Comment: Socially   Drug use: Yes    Types: Marijuana    Comment: none for 1 yr.-10/2012   Sexual activity: Not on file    Comment: occ-1/2 pk week  Other Topics Concern   Not on file  Social History Narrative   Lives alone.    Disabled   Social Determinants of Health   Financial Resource Strain: Not on file  Food Insecurity: Not on file  Transportation Needs: Not on file  Physical Activity: Not on file  Stress: Not on file  Social Connections: Not on file   Family History  Problem Relation Age of Onset   Hypertension Mother    Heart attack  Father    Allergies  Allergen Reactions   Hydromorphone Hcl Shortness Of Breath    Patient went into respiratory distress   Prior to Admission medications   Medication Sig Start Date End Date Taking? Authorizing Provider  albuterol (PROVENTIL) (2.5 MG/3ML) 0.083% nebulizer solution INHALE CONTENTS OF 1 VIAL IN NEBULIZER EVERY 6 HOURS IF NEEDED FOR WHEEZING OR SHORTNESS OF BREATH 05/13/19   Jetty Duhamel D, MD  atorvastatin (LIPITOR) 40 MG tablet Take 40 mg by mouth daily.    [provider]  celecoxib (CELEBREX) 200 MG capsule Take 200 mg by mouth daily.    [provider]  cephALEXin  (KEFLEX) 500 MG capsule Take 1 capsule (500 mg total) by mouth 2 (two) times daily. 08/31/20   Drema Halon, MD  fluticasone furoate-vilanterol (BREO ELLIPTA) 100-25 MCG/INH AEPB INHALE 1 PUFF BY MOUTH EVERY DAY. RINSE MOUTH AFTER USE 05/20/20   Jetty Duhamel D, MD  gabapentin (NEURONTIN) 300 MG capsule Take 300 mg by mouth daily as needed (pain). 02/01/19   [provider]  glipiZIDE (GLUCOTROL) 10 MG tablet Take 10 mg by mouth daily before breakfast.    [provider]  HYDROcodone-acetaminophen (NORCO) 7.5-325 MG tablet Take 1 tablet by mouth 3 (three) times daily as needed for pain. 08/22/20   [provider]  lisinopril (ZESTRIL) 20 MG tablet Take 20 mg by mouth daily. 07/10/20   [provider]  PROAIR HFA 108 (90 Base) MCG/ACT inhaler Inhale 2 puffs into the lungs every 6 (six) hours as needed for shortness of breath. 08/31/20   Drema Halon, MD  sitaGLIPtan-metformin (JANUMET) 50-1000 MG per tablet Take 1 tablet by mouth 2 (two) times daily with a meal.    [provider]  TRULICITY 1.5 MG/0.5ML SOPN Inject 1.5 mg into the skin every Saturday. 12/28/18   [provider]  Vitamin D, Ergocalciferol, (DRISDOL) 1.25 MG (50000 UNIT) CAPS capsule Take 50,000 Units by mouth once a week. 07/14/20   [provider]     Physical Exam: He has moderate scabbing in both nasal passages that was causing partial obstruction of his breathing.  This was removed in the breathe much better.   Assessment: S/p septoplasty, turbinate reductions and left nasal vestibuloplasty.  Plan: He was given some NeilMed nasal sinus rinse bottles to use once or twice a day.  He will follow-up in 2 weeks for recheck and cleaning the nose.   Narda Bonds, MD

## 2020-09-20 ENCOUNTER — Ambulatory Visit (INDEPENDENT_AMBULATORY_CARE_PROVIDER_SITE_OTHER): Payer: Medicare Other | Admitting: Otolaryngology

## 2020-09-20 ENCOUNTER — Other Ambulatory Visit: Payer: Self-pay

## 2020-09-20 DIAGNOSIS — Z4889 Encounter for other specified surgical aftercare: Secondary | ICD-10-CM

## 2020-09-20 MED ORDER — TRIAMCINOLONE ACETONIDE 55 MCG/ACT NA AERO: 2.0000 | INHALATION_SPRAY | Freq: Every day | NASAL | 12 refills | Status: AC

## 2020-09-20 NOTE — Progress Notes (Signed)
HPI: Christian Bowen is a 60 y.o. male who presents 3 weeks s/p septoplasty and turbinate reductions with left nasal vestibuloplasty.  Patient is doing well with improved breathing.Marland Kitchen  He is also having some popping in his ears and has to clear his ears.  Past Medical History:  Diagnosis Date   Apnea, sleep    was tested 2005   Asthma    Diabetes mellitus    Hyperlipemia 10/12/2013   Hypertension    Lumbar post-laminectomy syndrome 04/02/2013   Obesity    Pain in joint, shoulder region 10/12/2013   Sleep apnea    CPAP-has had UPPP-tonsils and turb red   Tobacco abuse 03/13/2013   Past Surgical History:  Procedure Laterality Date   BACK SURGERY  2009   DORSAL COMPARTMENT RELEASE Left 08/13/2012   Procedure: RELEASE DORSAL COMPARTMENT (DEQUERVAIN) and exploration of radial nerve branch;  Surgeon: Tami Ribas, MD;  Location: [facility];  Service: Orthopedics;  Laterality: Left;   GANGLION CYST EXCISION  2/14-SCG   lt wrist    GANGLION CYST EXCISION Left 08/13/2012   Procedure: REMOVAL GANGLION OF WRIST;  Surgeon: Tami Ribas, MD;  Location: [facility];  Service: Orthopedics;  Laterality: Left;   heel spurs     bilateral feel   NASAL SEPTOPLASTY W/ TURBINOPLASTY N/A 08/30/2020   Procedure: NASAL SEPTOPLASTY WITH BILATERAL TURBINATE REDUCTION AND VESTIBULAPLASTY;  Surgeon: Drema Halon, MD;  Location: [facility] OR;  Service: ENT;  Laterality: N/A;   NASAL TURBINATE REDUCTION  8/13   septoplasty-UPPP-tonsils   SHOULDER ARTHROSCOPY Right 07/02/2013   Procedure: RIGHT SHOULDER ARTHROSCOPY WITH EXTENSIVE DEBRIDEMENT OF ROTATOR CUFF AND LABRUM, SUBACROMIAL DECOMPRESSION, PARTIAL ACROMIOPLASTY WITH CORACOACROMIAL RELEASE;  Surgeon: Thera Flake., MD;  Location: [facility];  Service: Orthopedics;  Laterality: Right;   SHOULDER ARTHROSCOPY WITH BICEPSTENOTOMY Right 04/15/2014   Procedure: SHOULDER ARTHROSCOPY WITH BICEPSTENOTOMY;  Surgeon: Frederico Hamman, MD;  Location: [facility];  Service: Orthopedics;  Laterality: Right;   SPINAL CORD STIMULATOR IMPLANT  1997   SPINAL CORD STIMULATOR INSERTION N/A 04/02/2013   Procedure: LUMBAR SPINAL CORD STIMULATOR Insertion;  Surgeon: Gwynne Edinger, MD;  Location: [facility];  Service: Neurosurgery;  Laterality: N/A;   TONSILLECTOMY     Social History   Socioeconomic History   Marital status: Single    Spouse name: Not on file   Number of children: Not on file   Years of education: Not on file   Highest education level: Not on file  Occupational History   Occupation: Disabled  Tobacco Use   Smoking status: Some Days    Packs/day: 0.25    Years: 30.00    Pack years: 7.50    Types: Cigarettes   Smokeless tobacco: Never   Tobacco comments:    1 pack will last him 2 weeks   Substance and Sexual Activity   Alcohol use: Yes    Comment: Socially   Drug use: Yes    Types: Marijuana    Comment: none for 1 yr.-10/2012   Sexual activity: Not on file    Comment: occ-1/2 pk week  Other Topics Concern   Not on file  Social History Narrative   Lives alone.    Disabled   Social Determinants of Health   Financial Resource Strain: Not on file  Food Insecurity: Not on file  Transportation Needs: Not on file  Physical Activity: Not on file  Stress: Not on file  Social Connections: Not on file   Family History  Problem Relation Age of Onset   Hypertension Mother    Heart attack Father    Allergies  Allergen Reactions   Hydromorphone Hcl Shortness Of Breath    Patient went into respiratory distress   Prior to Admission medications   Medication Sig Start Date End Date Taking? Authorizing Provider  albuterol (PROVENTIL) (2.5 MG/3ML) 0.083% nebulizer solution INHALE CONTENTS OF 1 VIAL IN NEBULIZER EVERY 6 HOURS IF NEEDED FOR WHEEZING OR SHORTNESS OF BREATH 05/13/19   Jetty Duhamel D, MD  atorvastatin (LIPITOR) 40 MG tablet Take 40 mg by mouth daily.    [provider]  celecoxib (CELEBREX) 200 MG capsule Take 200 mg by mouth daily.    [provider]  cephALEXin (KEFLEX) 500 MG capsule Take 1 capsule (500 mg total) by mouth 2 (two) times daily. 08/31/20   Drema Halon, MD  fluticasone furoate-vilanterol (BREO ELLIPTA) 100-25 MCG/INH AEPB INHALE 1 PUFF BY MOUTH EVERY DAY. RINSE MOUTH AFTER USE 05/20/20   Jetty Duhamel D, MD  gabapentin (NEURONTIN) 300 MG capsule Take 300 mg by mouth daily as needed (pain). 02/01/19   [provider]  glipiZIDE (GLUCOTROL) 10 MG tablet Take 10 mg by mouth daily before breakfast.    [provider]  HYDROcodone-acetaminophen (NORCO) 7.5-325 MG tablet Take 1 tablet by mouth 3 (three) times daily as needed for pain. 08/22/20   [provider]  lisinopril (ZESTRIL) 20 MG tablet Take 20 mg by mouth daily. 07/10/20   [provider]  PROAIR HFA 108 (90 Base) MCG/ACT inhaler Inhale 2 puffs into the lungs every 6 (six) hours as needed for shortness of breath. 08/31/20   Drema Halon, MD  sitaGLIPtan-metformin (JANUMET) 50-1000 MG per tablet Take 1 tablet by mouth 2 (two) times daily with a meal.    [provider]  TRULICITY 1.5 MG/0.5ML SOPN Inject 1.5 mg into the skin every Saturday. 12/28/18   [provider]  Vitamin D, Ergocalciferol, (DRISDOL) 1.25 MG (50000 UNIT) CAPS capsule Take 50,000 Units by mouth once a week. 07/14/20   [provider]     Physical Exam: He has mild crusting along the inferior turbinates bilaterally as well as a little bit of crusting along the septum.  The left nasal passageway is still little bit narrower than the right.   Assessment: S/p septoplasty and turbinate reductions performed 3 weeks ago. Chronic rhinitis with eustachian tube dysfunction.   Plan: Would recommend continuation with the saline rinses. Also suggested using Nasacort 2 sprays each nostril at night as this will help with some  congestion in the nose as well as eustachian tube dysfunction. He will follow-up in 3 to 4 weeks for recheck.   Narda Bonds, MD

## 2020-10-15 ENCOUNTER — Other Ambulatory Visit (INDEPENDENT_AMBULATORY_CARE_PROVIDER_SITE_OTHER): Payer: Self-pay | Admitting: Otolaryngology

## 2020-10-18 ENCOUNTER — Encounter (INDEPENDENT_AMBULATORY_CARE_PROVIDER_SITE_OTHER): Payer: Medicare Other | Admitting: Otolaryngology

## 2020-11-11 ENCOUNTER — Encounter (HOSPITAL_COMMUNITY): Payer: Self-pay | Admitting: Emergency Medicine

## 2020-11-11 ENCOUNTER — Ambulatory Visit (HOSPITAL_COMMUNITY)
Admission: EM | Admit: 2020-11-11 | Discharge: 2020-11-11 | Disposition: A | Discharge status: Home / Self Care | Payer: Medicare Other | Attending: Student | Admitting: Student

## 2020-11-11 ENCOUNTER — Other Ambulatory Visit: Payer: Self-pay

## 2020-11-11 DIAGNOSIS — K047 Periapical abscess without sinus: Secondary | ICD-10-CM

## 2020-11-11 MED ORDER — AMOXICILLIN-POT CLAVULANATE 875-125 MG PO TABS: 1.0000 | ORAL_TABLET | Freq: Two times a day (BID) | ORAL | 0 refills | Status: AC

## 2020-11-11 NOTE — ED Triage Notes (Signed)
Pt is present today with right ear pain and right gum pain. Pt states that pain started Thursday

## 2020-11-11 NOTE — Discharge Instructions (Addendum)
-  Start the antibiotic-Augmentin (amoxicillin-clavulanate), 1 pill every 12 hours for 7 days.  You can take this with food like with breakfast and dinner. -Follow-up with your dentist if symptoms persist - tooth pain, pain under tongue, new sore throat, trouble swallowing, new fever/chills

## 2020-11-11 NOTE — ED Provider Notes (Signed)
[facility]    CSN: [id number] Arrival date & time: 11/11/20  1207      History   Chief Complaint Chief Complaint  Patient presents with   Otalgia   Dental Pain    HPI Christian Bowen is a 60 y.o. male presenting with dental pain x4 days.  Medical history pancreatitis, binge drinking, diabetes.  This patient did have cervical spinal surgery on 9/29, this seems to be healing well.  Notes dental pain for about a week, worse on the right lower gums.  Denies foul taste in mouth, pain under tongue, pain, sore throat, trouble swallowing, fever/chills.  HPI  Past Medical History:  Diagnosis Date   Apnea, sleep    was tested 2005   Asthma    Diabetes mellitus    Hyperlipemia 10/12/2013   Hypertension    Lumbar post-laminectomy syndrome 04/02/2013   Obesity    Pain in joint, shoulder region 10/12/2013   Sleep apnea    CPAP-has had UPPP-tonsils and turb red   Tobacco abuse 03/13/2013    Patient Active Problem List   Diagnosis Date Noted   Nasal septal deviation 08/30/2020   Pancreatitis 08/18/2016   Alcohol consumption binge drinking 08/18/2016   Essential hypertension, benign 10/12/2013   Hyperlipemia 10/12/2013   DM (diabetes mellitus), secondary, uncontrolled, with peripheral vascular complications (HCC) 10/12/2013   Pain in joint, shoulder region 10/12/2013   Lumbar post-laminectomy syndrome 04/02/2013   Obstructive sleep apnea 03/13/2013   Morbid obesity (HCC) 03/13/2013   Asthma, moderate persistent 03/13/2013   Tobacco abuse 03/13/2013    Past Surgical History:  Procedure Laterality Date   BACK SURGERY  2009   DORSAL COMPARTMENT RELEASE Left 08/13/2012   Procedure: RELEASE DORSAL COMPARTMENT (DEQUERVAIN) and exploration of radial nerve branch;  Surgeon: Tami Ribas, MD;  Location: [facility];  Service: Orthopedics;  Laterality: Left;   GANGLION CYST EXCISION  2/14-SCG   lt wrist    GANGLION CYST EXCISION Left 08/13/2012    Procedure: REMOVAL GANGLION OF WRIST;  Surgeon: Tami Ribas, MD;  Location: [facility];  Service: Orthopedics;  Laterality: Left;   heel spurs     bilateral feel   NASAL SEPTOPLASTY W/ TURBINOPLASTY N/A 08/30/2020   Procedure: NASAL SEPTOPLASTY WITH BILATERAL TURBINATE REDUCTION AND VESTIBULAPLASTY;  Surgeon: Drema Halon, MD;  Location: [facility];  Service: ENT;  Laterality: N/A;   NASAL TURBINATE REDUCTION  8/13   septoplasty-UPPP-tonsils   SHOULDER ARTHROSCOPY Right 07/02/2013   Procedure: RIGHT SHOULDER ARTHROSCOPY WITH EXTENSIVE DEBRIDEMENT OF ROTATOR CUFF AND LABRUM, SUBACROMIAL DECOMPRESSION, PARTIAL ACROMIOPLASTY WITH CORACOACROMIAL RELEASE;  Surgeon: Thera Flake., MD;  Location: [facility];  Service: Orthopedics;  Laterality: Right;   SHOULDER ARTHROSCOPY WITH BICEPSTENOTOMY Right 04/15/2014   Procedure: SHOULDER ARTHROSCOPY WITH BICEPSTENOTOMY;  Surgeon: Frederico Hamman, MD;  Location:  SURGERY CENTER;  Service: Orthopedics;  Laterality: Right;   SPINAL CORD STIMULATOR IMPLANT  1997   SPINAL CORD STIMULATOR INSERTION N/A 04/02/2013   Procedure: LUMBAR SPINAL CORD STIMULATOR Insertion;  Surgeon: Gwynne Edinger, MD;  Location: [facility];  Service: Neurosurgery;  Laterality: N/A;   TONSILLECTOMY         Home Medications    Prior to Admission medications   Medication Sig Start Date End Date Taking? Authorizing Provider  amoxicillin-clavulanate (AUGMENTIN) 875-125 MG tablet Take 1 tablet by mouth every 12 (twelve) hours. 11/11/20  Yes Rhys Martini, PA-C  albuterol (PROVENTIL) (2.5  MG/3ML) 0.083% nebulizer solution INHALE CONTENTS OF 1 VIAL IN NEBULIZER EVERY 6 HOURS IF NEEDED FOR WHEEZING OR SHORTNESS OF BREATH 05/13/19   Jetty Duhamel D, MD  atorvastatin (LIPITOR) 40 MG tablet Take 40 mg by mouth daily.    [provider]  celecoxib (CELEBREX) 200 MG capsule Take 200 mg by mouth daily.    [provider]  fluticasone  furoate-vilanterol (BREO ELLIPTA) 100-25 MCG/INH AEPB INHALE 1 PUFF BY MOUTH EVERY DAY. RINSE MOUTH AFTER USE 05/20/20   Jetty Duhamel D, MD  gabapentin (NEURONTIN) 300 MG capsule Take 300 mg by mouth daily as needed (pain). 02/01/19   [provider]  glipiZIDE (GLUCOTROL) 10 MG tablet Take 10 mg by mouth daily before breakfast.    [provider]  HYDROcodone-acetaminophen (NORCO) 7.5-325 MG tablet Take 1 tablet by mouth 3 (three) times daily as needed for pain. 08/22/20   [provider]  lisinopril (ZESTRIL) 20 MG tablet Take 20 mg by mouth daily. 07/10/20   [provider]  PROAIR HFA 108 (90 Base) MCG/ACT inhaler Inhale 2 puffs into the lungs every 6 (six) hours as needed for shortness of breath. 08/31/20   Drema Halon, MD  sitaGLIPtan-metformin (JANUMET) 50-1000 MG per tablet Take 1 tablet by mouth 2 (two) times daily with a meal.    [provider]  triamcinolone (NASACORT) 55 MCG/ACT AERO nasal inhaler Place 2 sprays into the nose daily. 2 sprays each nostril at night 09/20/20   Drema Halon, MD  TRULICITY 1.5 MG/0.5ML SOPN Inject 1.5 mg into the skin every Saturday. 12/28/18   [provider]  Vitamin D, Ergocalciferol, (DRISDOL) 1.25 MG (50000 UNIT) CAPS capsule Take 50,000 Units by mouth once a week. 07/14/20   [provider]    Family History Family History  Problem Relation Age of Onset   Hypertension Mother    Heart attack Father     Social History Social History   Tobacco Use   Smoking status: Some Days    Packs/day: 0.25    Years: 30.00    Pack years: 7.50    Types: Cigarettes   Smokeless tobacco: Never   Tobacco comments:    1 pack will last him 2 weeks   Substance Use Topics   Alcohol use: Yes    Comment: Socially   Drug use: Yes    Types: Marijuana    Comment: none for 1 yr.-10/2012     Allergies   Hydromorphone hcl   Review of Systems Review of Systems  HENT:  Positive for  dental problem.   All other systems reviewed and are negative.   Physical Exam Triage Vital Signs ED Triage Vitals  Enc Vitals Group     BP 11/11/20 1326 (!) 142/87     Pulse Rate 11/11/20 1326 80     Resp 11/11/20 1326 18     Temp 11/11/20 1326 (!) 97 F (36.1 C)     Temp src --      SpO2 11/11/20 1326 99 %     Weight --      Height --      Head Circumference --      Peak Flow --      Pain Score 11/11/20 1325 10     Pain Loc --      Pain Edu? --      Excl. in GC? --    No data found.  Updated Vital Signs BP (!) 142/87   Pulse  80   Temp (!) 97 F (36.1 C)   Resp 18   SpO2 99%   Visual Acuity Right Eye Distance:   Left Eye Distance:   Bilateral Distance:    Right Eye Near:   Left Eye Near:    Bilateral Near:     Physical Exam Vitals reviewed.  Constitutional:      General: He is not in acute distress.    Appearance: Normal appearance. He is not ill-appearing, toxic-appearing or diaphoretic.  HENT:     Head: Normocephalic and atraumatic.     Jaw: There is normal jaw occlusion. No trismus, tenderness, swelling, pain on movement or malocclusion.     Salivary Glands: Right salivary gland is not diffusely enlarged or tender. Left salivary gland is not diffusely enlarged or tender.     Right Ear: Hearing normal.     Left Ear: Hearing normal.     Nose: Nose normal.     Mouth/Throat:     Lips: Pink.     Mouth: Mucous membranes are moist. No lacerations or oral lesions.     Dentition: Abnormal dentition. Does not have dentures. Dental tenderness, gingival swelling and dental caries present.     Tongue: No lesions. Tongue does not deviate from midline.     Palate: No mass.     Pharynx: Oropharynx is clear. Uvula midline. No oropharyngeal exudate or posterior oropharyngeal erythema.     Tonsils: No tonsillar exudate or tonsillar abscesses.     Comments: Poor dentician  Missing multiple teeth.  Right lower molars missing, gingiva is erythematous and tender. No  trismus, drooling, sore throat, voice changes, swelling underneath the tongue, swelling underneath the jaw, neck stiffness.  Eyes:     Extraocular Movements: Extraocular movements intact.     Pupils: Pupils are equal, round, and reactive to light.  Pulmonary:     Effort: Pulmonary effort is normal.  Neurological:     General: No focal deficit present.     Mental Status: He is alert and oriented to person, place, and time.  Psychiatric:        Mood and Affect: Mood normal.        Behavior: Behavior normal.        Thought Content: Thought content normal.        Judgment: Judgment normal.     UC Treatments / Results  Labs (all labs ordered are listed, but only abnormal results are displayed) Labs Reviewed - No data to display  EKG   Radiology No results found.  Procedures Procedures (including critical care time)  Medications Ordered in UC Medications - No data to display  Initial Impression / Assessment and Plan / UC Course  I have reviewed the triage vital signs and the nursing notes.  Pertinent labs & imaging results that were available during my care of the patient were reviewed by me and considered in my medical decision making (see chart for details).     This patient is a very pleasant 60 y.o. year old male presenting with dental infection. Afebrile, nontachy.   Augmentin sent, f/u with dentist.   Recent spinal surgery 9/29, last oxycodone x30 tabs was filled 3 days ago. Continue this for pain.   ED return precautions discussed. Patient verbalizes understanding and agreement.    Final Clinical Impressions(s) / UC Diagnoses   Final diagnoses:  Dental infection     Discharge Instructions      -Start the antibiotic-Augmentin (amoxicillin-clavulanate), 1 pill every 12 hours for  7 days.  You can take this with food like with breakfast and dinner. -Follow-up with your dentist if symptoms persist - tooth pain, pain under tongue, new sore throat, trouble  swallowing, new fever/chills      ED Prescriptions     Medication Sig Dispense Auth. Provider   amoxicillin-clavulanate (AUGMENTIN) 875-125 MG tablet Take 1 tablet by mouth every 12 (twelve) hours. 14 tablet Rhys Martini, PA-C      I have reviewed the PDMP during this encounter.   Rhys Martini, PA-C 11/11/20 1348

## 2021-03-29 ENCOUNTER — Encounter (INDEPENDENT_AMBULATORY_CARE_PROVIDER_SITE_OTHER): Payer: Self-pay | Admitting: Podiatry

## 2021-03-29 ENCOUNTER — Other Ambulatory Visit: Payer: Medicare Other

## 2021-03-29 NOTE — Progress Notes (Signed)
NO SHOW. This encounter was created in error - please disregard.

## 2021-04-01 ENCOUNTER — Emergency Department (HOSPITAL_COMMUNITY): Payer: Medicare Other

## 2021-04-01 ENCOUNTER — Other Ambulatory Visit: Payer: Self-pay

## 2021-04-01 ENCOUNTER — Emergency Department (HOSPITAL_COMMUNITY)
Admission: EM | Admit: 2021-04-01 | Discharge: 2021-04-01 | Disposition: A | Discharge status: Home / Self Care | Payer: Medicare Other | Attending: Student | Admitting: Student

## 2021-04-01 ENCOUNTER — Encounter (HOSPITAL_COMMUNITY): Payer: Self-pay | Admitting: Emergency Medicine

## 2021-04-01 DIAGNOSIS — Z7984 Long term (current) use of oral hypoglycemic drugs: Secondary | ICD-10-CM | POA: Insufficient documentation

## 2021-04-01 DIAGNOSIS — M542 Cervicalgia: Secondary | ICD-10-CM

## 2021-04-01 DIAGNOSIS — R200 Anesthesia of skin: Secondary | ICD-10-CM | POA: Diagnosis not present

## 2021-04-01 DIAGNOSIS — Z7951 Long term (current) use of inhaled steroids: Secondary | ICD-10-CM | POA: Diagnosis not present

## 2021-04-01 DIAGNOSIS — J4540 Moderate persistent asthma, uncomplicated: Secondary | ICD-10-CM | POA: Insufficient documentation

## 2021-04-01 DIAGNOSIS — R202 Paresthesia of skin: Secondary | ICD-10-CM | POA: Diagnosis not present

## 2021-04-01 DIAGNOSIS — M5412 Radiculopathy, cervical region: Secondary | ICD-10-CM | POA: Insufficient documentation

## 2021-04-01 DIAGNOSIS — E119 Type 2 diabetes mellitus without complications: Secondary | ICD-10-CM | POA: Insufficient documentation

## 2021-04-01 MED ORDER — CYCLOBENZAPRINE HCL 10 MG PO TABS: 10.0000 mg | ORAL_TABLET | Freq: Two times a day (BID) | ORAL | 0 refills | Status: AC | PRN

## 2021-04-01 MED ORDER — IBUPROFEN 800 MG PO TABS: 800.0000 mg | ORAL_TABLET | Freq: Three times a day (TID) | ORAL | 0 refills | Status: AC
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
## 2021-04-01 NOTE — Discharge Instructions (Addendum)
Please use Tylenol or ibuprofen for pain.  You may use 600 mg ibuprofen every 6 hours or 1000 mg of Tylenol every 6 hours.  You may choose to alternate between the 2.  This would be most effective.  Not to exceed 4 g of Tylenol within 24 hours.  Not to exceed 3200 mg ibuprofen 24 hours.  You can use the muscle relaxant in addition to the above. Please follow up with your orthopedic doctor / neurologist for further evaluation.  Alignment: Reversal of cervical lordosis.  No listhesis     Skull base and vertebrae: No acute fracture. No primary bone lesion  or focal pathologic process. C5-6 and C6-7 ACDF with solid  arthrodesis.     Soft tissues and spinal canal: No prevertebral fluid or swelling. No  visible canal hematoma.     Disc levels:     C2-3: Disc narrowing and mild ridging     C3-4: Disc space narrowing with asymmetric left uncovertebral and  facet spurring causing foraminal impingement. Ankylosis at this  level.     C4-5: Disc narrowing and calcified disc bulging with minor spurring     C5-6: ACDF.  No bony impingement     C6-7: ACDF.  No bony impingement     C7-T1:Mild disc space narrowing     Upper chest: Negative
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
